# Patient Record
Sex: Female | Born: 1985 | Race: Black or African American | Hispanic: No | Marital: Single | Smoking: Current some day smoker
Health system: Southern US, Community
[De-identification: ages and names within clinical notes are randomized; demographics above are authoritative.]

## PROBLEM LIST (undated history)

## (undated) DIAGNOSIS — M4306 Spondylolysis, lumbar region: Secondary | ICD-10-CM

## (undated) DIAGNOSIS — T7840XA Allergy, unspecified, initial encounter: Secondary | ICD-10-CM

## (undated) DIAGNOSIS — E010 Iodine-deficiency related diffuse (endemic) goiter: Secondary | ICD-10-CM

## (undated) DIAGNOSIS — N809 Endometriosis, unspecified: Secondary | ICD-10-CM

## (undated) DIAGNOSIS — F909 Attention-deficit hyperactivity disorder, unspecified type: Secondary | ICD-10-CM

## (undated) HISTORY — DX: Allergy, unspecified, initial encounter: T78.40XA

## (undated) HISTORY — PX: INCISE AND DRAIN ABCESS: PRO64

## (undated) HISTORY — DX: Iodine-deficiency related diffuse (endemic) goiter: E01.0

## (undated) HISTORY — DX: Attention-deficit hyperactivity disorder, unspecified type: F90.9

---
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
## 2006-04-03 ENCOUNTER — Emergency Department (HOSPITAL_COMMUNITY): Admission: EM | Admit: 2006-04-03 | Discharge: 2006-04-03 | Payer: Self-pay | Admitting: Family Medicine

## 2006-04-05 ENCOUNTER — Emergency Department (HOSPITAL_COMMUNITY): Admission: EM | Admit: 2006-04-05 | Discharge: 2006-04-05 | Payer: Self-pay | Admitting: Emergency Medicine

## 2006-05-04 ENCOUNTER — Emergency Department (HOSPITAL_COMMUNITY): Admission: EM | Admit: 2006-05-04 | Discharge: 2006-05-04 | Payer: Self-pay | Admitting: Emergency Medicine

## 2006-07-26 ENCOUNTER — Emergency Department (HOSPITAL_COMMUNITY): Admission: EM | Admit: 2006-07-26 | Discharge: 2006-07-26 | Payer: Self-pay | Admitting: Emergency Medicine

## 2006-09-15 ENCOUNTER — Emergency Department (HOSPITAL_COMMUNITY): Admission: EM | Admit: 2006-09-15 | Discharge: 2006-09-15 | Payer: Self-pay | Admitting: Family Medicine

## 2006-09-28 ENCOUNTER — Emergency Department (HOSPITAL_COMMUNITY): Admission: EM | Admit: 2006-09-28 | Discharge: 2006-09-28 | Payer: Self-pay | Admitting: Family Medicine

## 2007-05-24 ENCOUNTER — Inpatient Hospital Stay (HOSPITAL_COMMUNITY): Admission: AD | Admit: 2007-05-24 | Discharge: 2007-05-26 | Payer: Self-pay | Admitting: Obstetrics and Gynecology

## 2007-07-09 ENCOUNTER — Emergency Department (HOSPITAL_COMMUNITY): Admission: EM | Admit: 2007-07-09 | Discharge: 2007-07-09 | Payer: Self-pay | Admitting: Emergency Medicine

## 2007-09-22 ENCOUNTER — Emergency Department (HOSPITAL_COMMUNITY): Admission: EM | Admit: 2007-09-22 | Discharge: 2007-09-22 | Payer: Self-pay | Admitting: Emergency Medicine

## 2007-09-23 ENCOUNTER — Emergency Department (HOSPITAL_COMMUNITY): Admission: EM | Admit: 2007-09-23 | Discharge: 2007-09-23 | Payer: Self-pay | Admitting: Emergency Medicine

## 2007-09-24 ENCOUNTER — Emergency Department (HOSPITAL_COMMUNITY): Admission: EM | Admit: 2007-09-24 | Discharge: 2007-09-24 | Payer: Self-pay | Admitting: Emergency Medicine

## 2007-11-04 ENCOUNTER — Inpatient Hospital Stay (HOSPITAL_COMMUNITY): Admission: EM | Admit: 2007-11-04 | Discharge: 2007-11-09 | Payer: Self-pay | Admitting: Emergency Medicine

## 2007-11-14 ENCOUNTER — Emergency Department (HOSPITAL_COMMUNITY): Admission: EM | Admit: 2007-11-14 | Discharge: 2007-11-14 | Payer: Self-pay | Admitting: Emergency Medicine

## 2008-01-17 ENCOUNTER — Emergency Department (HOSPITAL_COMMUNITY): Admission: EM | Admit: 2008-01-17 | Discharge: 2008-01-17 | Payer: Self-pay | Admitting: Emergency Medicine

## 2008-01-22 ENCOUNTER — Inpatient Hospital Stay (HOSPITAL_COMMUNITY): Admission: AD | Admit: 2008-01-22 | Discharge: 2008-01-22 | Payer: Self-pay | Admitting: Obstetrics and Gynecology

## 2008-04-13 ENCOUNTER — Inpatient Hospital Stay (HOSPITAL_COMMUNITY): Admission: AD | Admit: 2008-04-13 | Discharge: 2008-04-13 | Payer: Self-pay | Admitting: Obstetrics and Gynecology

## 2008-06-09 HISTORY — PX: TUBAL LIGATION: SHX77

## 2008-09-15 ENCOUNTER — Inpatient Hospital Stay (HOSPITAL_COMMUNITY): Admission: AD | Admit: 2008-09-15 | Discharge: 2008-09-15 | Payer: Self-pay | Admitting: Obstetrics and Gynecology

## 2008-11-12 ENCOUNTER — Inpatient Hospital Stay (HOSPITAL_COMMUNITY): Admission: AD | Admit: 2008-11-12 | Discharge: 2008-11-15 | Payer: Self-pay | Admitting: Obstetrics and Gynecology

## 2008-11-13 ENCOUNTER — Encounter (INDEPENDENT_AMBULATORY_CARE_PROVIDER_SITE_OTHER): Payer: Self-pay | Admitting: Obstetrics and Gynecology

## 2009-06-09 HISTORY — PX: ENDOMETRIAL ABLATION: SHX621

## 2009-08-10 ENCOUNTER — Emergency Department (HOSPITAL_COMMUNITY): Admission: EM | Admit: 2009-08-10 | Discharge: 2009-08-10 | Payer: Self-pay | Admitting: Family Medicine

## 2009-11-03 IMAGING — CR DG CHEST 2V
2 series · 2 of 2 positions shown · non-contrast
Comparison: 07/09/2007

CLINICAL DATA: Syncope.  Nausea.

CHEST - 2 VIEW

[w chest pa]
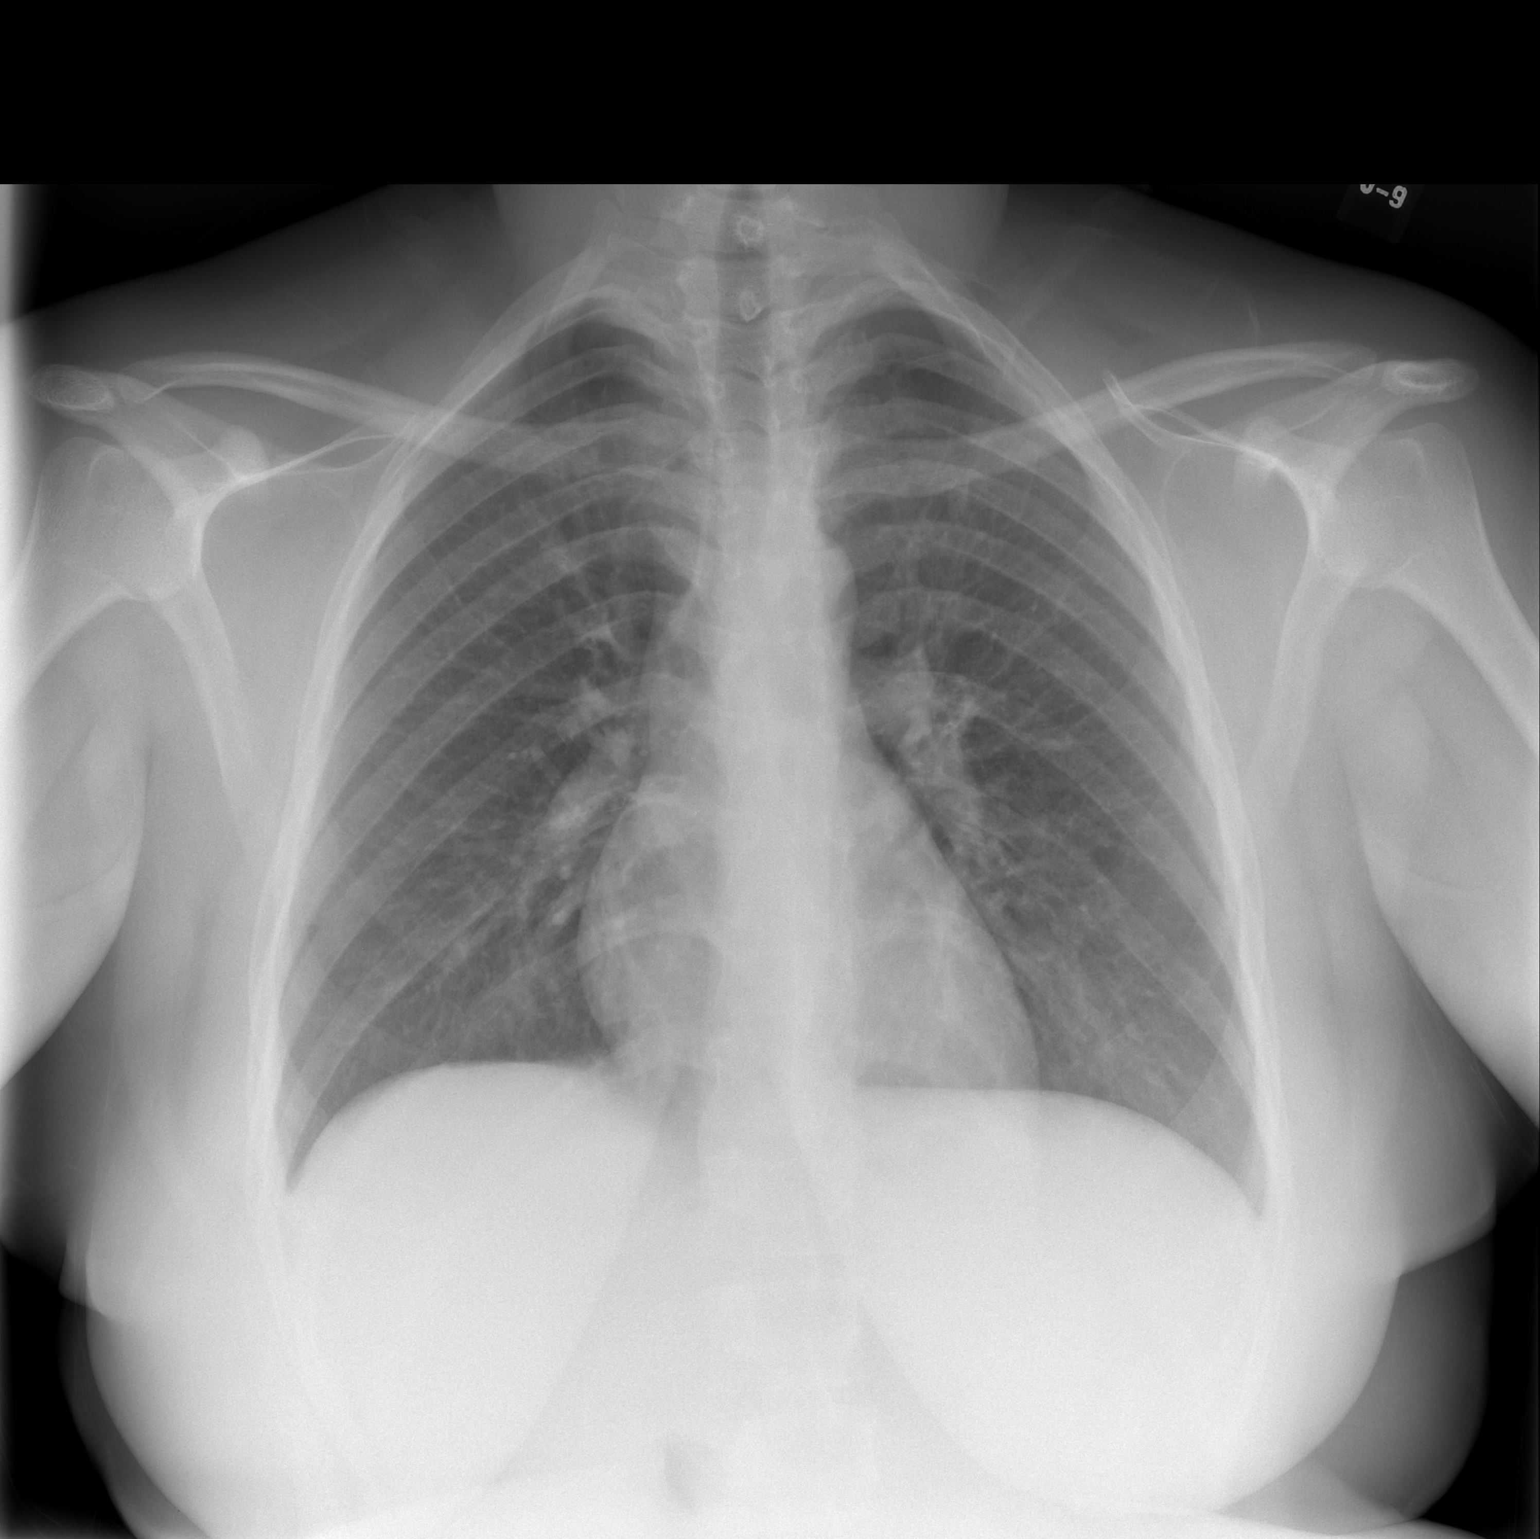

[w chest lat]
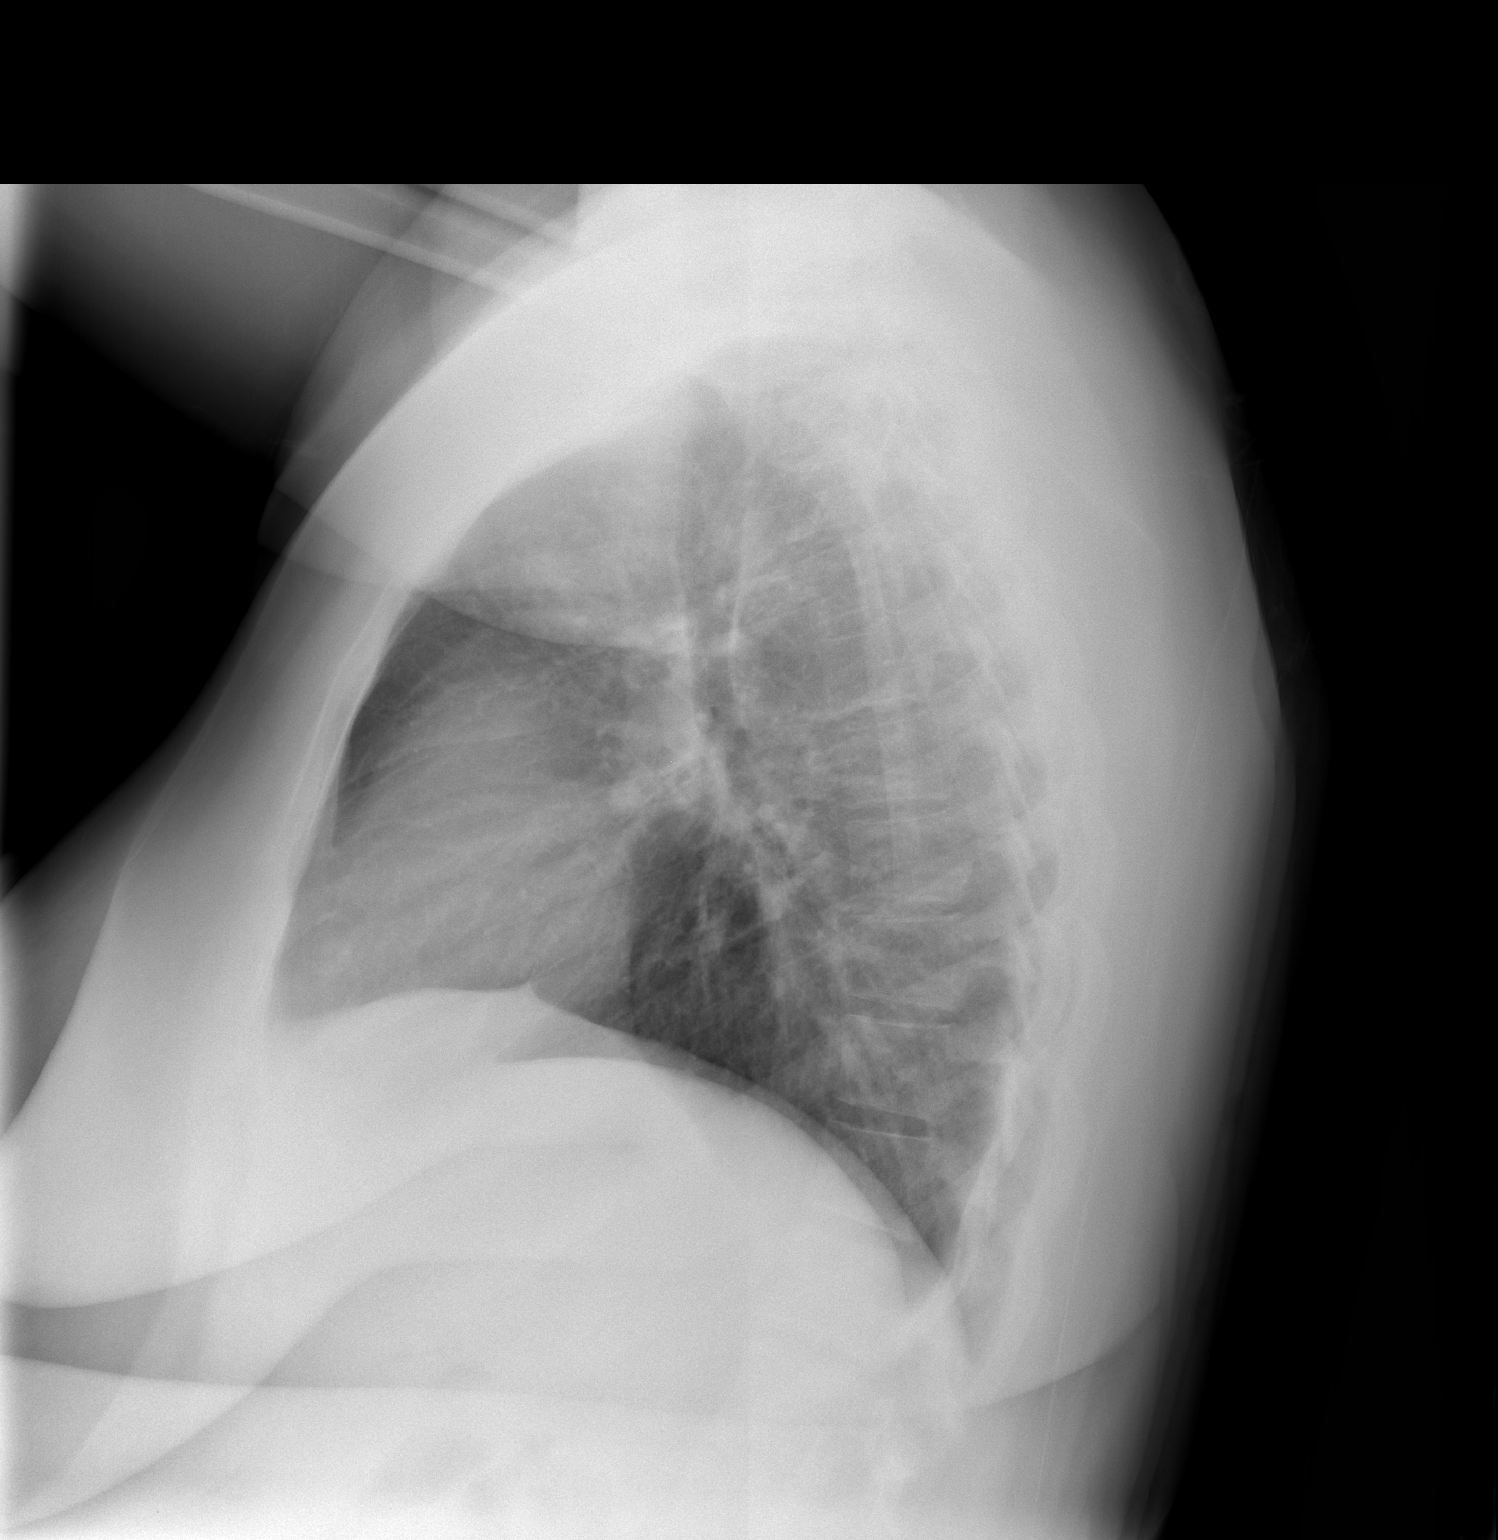

[2 of 2 positions shown; findings below may reference images not displayed]

FINDINGS: Cardiac and mediastinal contours appear normal.  The
lungs appear clear.  No pleural effusion or acute thoracic findings
identified.
IMPRESSION: 1.  No acute thoracic findings

## 2009-12-05 ENCOUNTER — Emergency Department (HOSPITAL_COMMUNITY): Admission: EM | Admit: 2009-12-05 | Discharge: 2009-12-05 | Payer: Self-pay | Admitting: Emergency Medicine

## 2010-02-21 ENCOUNTER — Ambulatory Visit (HOSPITAL_COMMUNITY): Admission: RE | Admit: 2010-02-21 | Discharge: 2010-02-21 | Payer: Self-pay | Admitting: Obstetrics and Gynecology

## 2010-05-16 ENCOUNTER — Inpatient Hospital Stay (HOSPITAL_COMMUNITY): Admission: AD | Admit: 2010-05-16 | Discharge: 2010-01-08 | Payer: Self-pay | Admitting: Obstetrics and Gynecology

## 2010-06-09 DIAGNOSIS — N800 Endometriosis of uterus: Secondary | ICD-10-CM

## 2010-06-09 DIAGNOSIS — N8003 Adenomyosis of the uterus: Secondary | ICD-10-CM

## 2010-06-09 HISTORY — DX: Adenomyosis of the uterus: N80.03

## 2010-06-09 HISTORY — DX: Endometriosis of uterus: N80.0

## 2010-06-11 ENCOUNTER — Inpatient Hospital Stay (HOSPITAL_COMMUNITY)
Admission: AD | Admit: 2010-06-11 | Discharge: 2010-06-11 | Payer: Self-pay | Source: Home / Self Care | Attending: Obstetrics and Gynecology | Admitting: Obstetrics and Gynecology

## 2010-08-01 DIAGNOSIS — J45909 Unspecified asthma, uncomplicated: Secondary | ICD-10-CM | POA: Insufficient documentation

## 2010-08-02 DIAGNOSIS — J309 Allergic rhinitis, unspecified: Secondary | ICD-10-CM | POA: Insufficient documentation

## 2010-08-19 LAB — URINALYSIS, ROUTINE W REFLEX MICROSCOPIC
Bilirubin Urine: NEGATIVE
Glucose, UA: NEGATIVE mg/dL
Ketones, ur: NEGATIVE mg/dL
Leukocytes, UA: NEGATIVE
Nitrite: NEGATIVE
Protein, ur: NEGATIVE mg/dL
Specific Gravity, Urine: 1.015 (ref 1.005–1.030)
Urobilinogen, UA: 0.2 mg/dL (ref 0.0–1.0)
pH: 7 (ref 5.0–8.0)

## 2010-08-19 LAB — GC/CHLAMYDIA PROBE AMP, GENITAL
Chlamydia, DNA Probe: NEGATIVE
GC Probe Amp, Genital: NEGATIVE

## 2010-08-19 LAB — POCT PREGNANCY, URINE: Preg Test, Ur: NEGATIVE

## 2010-08-19 LAB — URINE MICROSCOPIC-ADD ON

## 2010-08-19 LAB — WET PREP, GENITAL
Trich, Wet Prep: NONE SEEN
Yeast Wet Prep HPF POC: NONE SEEN

## 2010-08-22 LAB — SURGICAL PCR SCREEN

## 2010-08-22 LAB — CBC
HCT: 38.8 % (ref 36.0–46.0)
RBC: 3.98 MIL/uL (ref 3.87–5.11)
RDW: 13.1 % (ref 11.5–15.5)
WBC: 7.5 10*3/uL (ref 4.0–10.5)

## 2010-08-23 LAB — CBC
HCT: 37.5 % (ref 36.0–46.0)
Hemoglobin: 13.1 g/dL (ref 12.0–15.0)
MCH: 33.5 pg (ref 26.0–34.0)
MCHC: 34.9 g/dL (ref 30.0–36.0)

## 2010-08-23 LAB — URINALYSIS, ROUTINE W REFLEX MICROSCOPIC
Glucose, UA: NEGATIVE mg/dL
Protein, ur: NEGATIVE mg/dL
Urobilinogen, UA: 1 mg/dL (ref 0.0–1.0)

## 2010-08-23 LAB — POCT PREGNANCY, URINE: Preg Test, Ur: NEGATIVE

## 2010-08-23 LAB — WET PREP, GENITAL

## 2010-09-16 LAB — CBC
Hemoglobin: 11.1 g/dL — ABNORMAL LOW (ref 12.0–15.0)
MCHC: 35 g/dL (ref 30.0–36.0)
MCV: 98.7 fL (ref 78.0–100.0)
Platelets: 204 10*3/uL (ref 150–400)
Platelets: 227 10*3/uL (ref 150–400)
RBC: 3.18 MIL/uL — ABNORMAL LOW (ref 3.87–5.11)
RDW: 13.5 % (ref 11.5–15.5)
RDW: 13.6 % (ref 11.5–15.5)

## 2010-09-16 LAB — DIFFERENTIAL
Basophils Absolute: 0 10*3/uL (ref 0.0–0.1)
Lymphocytes Relative: 10 % — ABNORMAL LOW (ref 12–46)
Monocytes Absolute: 0.8 10*3/uL (ref 0.1–1.0)
Monocytes Relative: 6 % (ref 3–12)
Neutro Abs: 11 10*3/uL — ABNORMAL HIGH (ref 1.7–7.7)

## 2010-09-16 LAB — URINE CULTURE
Colony Count: NO GROWTH
Culture: NO GROWTH
Special Requests: NEGATIVE

## 2010-09-16 LAB — RPR: RPR Ser Ql: NONREACTIVE

## 2010-09-18 LAB — URINALYSIS, ROUTINE W REFLEX MICROSCOPIC
Bilirubin Urine: NEGATIVE
Glucose, UA: NEGATIVE mg/dL
Ketones, ur: >80 mg/dL — AB
Protein, ur: NEGATIVE mg/dL

## 2010-09-27 DIAGNOSIS — H6990 Unspecified Eustachian tube disorder, unspecified ear: Secondary | ICD-10-CM | POA: Insufficient documentation

## 2010-09-27 DIAGNOSIS — N949 Unspecified condition associated with female genital organs and menstrual cycle: Secondary | ICD-10-CM | POA: Insufficient documentation

## 2010-09-27 DIAGNOSIS — N8000 Endometriosis of the uterus, unspecified: Secondary | ICD-10-CM | POA: Insufficient documentation

## 2010-09-27 DIAGNOSIS — L309 Dermatitis, unspecified: Secondary | ICD-10-CM | POA: Insufficient documentation

## 2010-10-22 NOTE — H&P (Signed)
NAMELORRENA, GORANSON              ACCOUNT NO.:  [id number]   MEDICAL RECORD NO.:  [id number]          PATIENT TYPE:  MAT   LOCATION:  MATC                          FACILITY:  [facility]   PHYSICIAN:  Crist Fat. Rivard, M.D. DATE OF BIRTH:  08-May-1986   DATE OF ADMISSION:  05/24/2007  DATE OF DISCHARGE:                              HISTORY & PHYSICAL   HISTORY OF PRESENT ILLNESS:  Ms. [name] is a 25 year old gravida 2, para  1-0-0-1 at 39-5/7 weeks who presents uterine contractions every 3  minutes for several hours.  She denies leaking or bleeding and reports  positive fetal movement.  Pregnancy has been remarkable for:  (1) ADHD,  (2) asthma, (3) penicillin allergy, (4) closely spaced pregnancy, (5)  elevated BMI, (6) group B strep negative.   PRENATAL LABORATORY DATA:  Blood type is O positive, Rh-antibody  negative.  VDRL nonreactive.  Rubella titer positive.  Hepatitis B  surface antigen negative.  HIV nonreactive.  Sickle cell test was  negative.  Varicella titer was negative.  Pap was normal.  GC and  Chlamydia cultures were negative at her first visit.  Hemoglobin upon  entry into practice was 12.7; it was 12.5 at 27 weeks.  Quadruple screen  was done and was normal.  RPR was normal.  Group B strep culture and  other cultures were negative at 36 weeks.   HISTORY OF PRESENT PREGNANCY:  The patient entered care at approximately  19 weeks.  She had an ultrasound at 19 weeks for anatomy.  She had had a  previous ultrasound at 17 weeks.  Her Glucola was normal  She had a  yeast infection at 29 weeks.  She had some upper respiratory issues at  35 weeks.  She had some asthma issues during her pregnancy and was  treated with albuterol.  The rest of her pregnancy was essentially  uncomplicated.   OBSTETRICAL HISTORY:  In January 2007, she had a vacuum-assisted vaginal  birth of a female infant, weight 8 pounds 3 ounces, at 41-3/7 weeks. She  was in labor 14 hours.  She had epidural  anesthesia.  That child was in  the NICU for 3 days for respiratory issues.   MEDICAL HISTORY:  The patient reports the usual childhood illnesses  except for an unknown chickenpox history.  Her varicella titer was  negative.  The patient was diagnosed with asthma at age 57 or 75.  The  patient was diagnosed with ADHD in the past.  She has not been on any  medication.  The patient's only other hospitalization was for  childbirth.   ALLERGIES:  She is allergic to PENICILLIN.   FAMILY HISTORY:  Her maternal grandfather, paternal grandmother and  paternal grandfather have hypertension.  Her father has diabetes.  Her  sister has depression and is bipolar.   GENETIC HISTORY:  Remarkable for a first cousin with heart murmur.   SURGICAL HISTORY:  None.   PHYSICAL EXAMINATION:  VITAL SIGNS:  Stable.  The patient is afebrile.  HEENT:  Within normal limits.  LUNGS:  Bilateral breath sounds are clear.  HEART:  Regular rate and rhythm without murmur.  BREASTS:  Soft and nontender.  ABDOMEN:  Fundal height is approximately 39 cm.  Estimated fetal weight  is 7 to 7-1/2 pounds.  Uterine contractions are every 2-3 minutes, of  moderate quality.  Fetal heart rate is reactive with no decelerations.  PELVIC:  Cervix initially was 1 cm, 70%, vertex at -2 station.  The  cervix is now, approximately 1 hour later, 4 cm, 80%, vertex at a -2  station with bulging bag of water.  EXTREMITIES:  Deep tendon reflexes are 2+ without clonus.  There is a  trace edema noted.   IMPRESSION:  1. Intrauterine pregnancy at 39-5/7 weeks.  2. Early active labor.  3. Negative group B streptococcus.   PLAN:  1. Admit to birthing suite per consult with Dr. Estanislado Pandy as attending      physician.  2. Routine certified nurse midwife orders.  3. The patient plans epidural.      Chip Boer L. Emilee Hero, C.N.M.      Crist Fat Rivard, M.D.  Electronically Signed    VLL/MEDQ  D:  05/24/2007  T:  05/24/2007  Job:  [id number]

## 2010-10-22 NOTE — Op Note (Signed)
[name], [name]              ACCOUNT NO.:  [id number]   MEDICAL RECORD NO.:  [id number]           PATIENT TYPE:   LOCATION:                                 FACILITY:   PHYSICIAN:  Janine Limbo, M.D.DATE OF BIRTH:  19-May-1986   DATE OF PROCEDURE:  11/13/2008  DATE OF DISCHARGE:                               OPERATIVE REPORT   PREOPERATIVE DIAGNOSES:  1. Postpartum day #1.  2. Desires sterilization.   POSTOPERATIVE DIAGNOSES:  1. Postpartum day #1.  2. Desires sterilization.   PROCEDURE:  Modified [place] postpartum bilateral tubal ligation.   SURGEON:  Janine Limbo, MD   FIRST ASSISTANT:  None.   ANESTHETIC:  Epidural.   DISPOSITION:  [name] is a 25 year old female, para 3-0-0-3, who  presents with the above-mentioned diagnosis.  She understands the  indications for her surgical procedure, and she accepts the risks of,  but not limited to, anesthetic complications, bleeding, infections, and  possible damage to the surrounding organs.  She understands that there  is a small but real failure rate associated with tubal ligation (17 per  1000).   FINDINGS:  The fallopian tubes were normal bilaterally.   PROCEDURE:  The patient was taken to the operating room where it was  determined that the epidural she had received for labor would be  adequate for tubal ligation.  The patient's abdomen and perineum were  prepped with multiple layers of Betadine.  A Foley catheter was placed  in the bladder.  The patient was sterilely draped.  The subumbilical  area was injected with 5 mL of 0.5% Marcaine with epinephrine.  A  subumbilical incision was made and carried sharply through the  subcutaneous tissue, the fascia, and the anterior peritoneum.  The left  fallopian tube was identified and followed to its fimbriated end.  A  knuckle of tube was made on the left using a free tie and then suture  ligatures of 0 plain catgut.  The knuckle of tube thus made was  excised.  Hemostasis was adequate.  An identical procedure was carried down the  opposite side.  Again, hemostasis was adequate.  All instruments were  removed.  The anterior peritoneum and the fascia were closed using a  running suture of 2-0 Vicryl.  The skin was  closed using a subcuticular suture of 3-0 Monocryl.  Sponge, needle, and  instrument counts were correct on 2 occasions.  The estimated blood loss  was 5 mL.  The patient tolerated the procedure well.  She was taken to  the recovery room in stable condition.  The cut portions of the  fallopian tubes were sent to pathology for evaluation.      Janine Limbo, M.D.  Electronically Signed     AVS/MEDQ  D:  11/13/2008  T:  11/14/2008  Job:  [id number]

## 2010-10-22 NOTE — H&P (Signed)
NAMEPASCHA, FOGAL              ACCOUNT NO.:  [id number]   MEDICAL RECORD NO.:  [id number]          PATIENT TYPE:  INP   LOCATION:  0103                         FACILITY:  [facility]   PHYSICIAN:  Herbie Saxon, MDDATE OF BIRTH:  1985/07/15   DATE OF ADMISSION:  11/04/2007  DATE OF DISCHARGE:                              HISTORY & PHYSICAL   PRIMARY CARE PHYSICIAN:  Unassigned.   She is a full code.  No assigned health care power of attorney.   PRESENTING COMPLAINT:  Headache, one week.  Fever and chills, three  days.   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  She was  quite well until a week ago when she started noticing generalized  headaches, dull, 9/10 in severity, continuous, aggravated by noise,  light, and movement.  Associated with body and muscle aches, nausea and  vomiting.  Treated because of diagnosed with fever and chills.  No  dysuria.  No cough.  No diarrhea.  No new skin rash or joint swelling.  No sinus congestion.  No earache or runny nose.  She does have  photophobia.  No tinnitus.  No neck stiffness.   PAST MEDICAL HISTORY:  As stated earlier.   PAST SURGICAL HISTORY:  Nil of note.   FAMILY HISTORY:  Nil of note.   SOCIAL HISTORY:  She is single.  She has two children, 105 years old, and  45 months old.  The children are healthy.  Her boyfriend, who she stays  with, is also not having any febrile illness.   MEDICATIONS:  None.   ALLERGIES:  PENICILLIN.   REVIEW OF SYSTEMS:  Fourteen systems are reviewed.  Pertinent positives  as in the history of presenting complaint.   PHYSICAL EXAMINATION:  She is a young lady, not in acute respiratory  distress.  Temperature 103, pulse 119, respiratory rate 22, blood pressure 92/56.  Pupils are equal and reactive to light and accommodation.  Mucous  membranes are moist.  No sinus tenderness.  Oropharynx and nasopharynx  are clear.  NECK:  Supple.  No neck stiffness.  No submandibular lymphadenopathy.  No  lymphadenopathy.  She has old identified eczematous lesions lateral aspect of both eyes.  CHEST:  Clinically clear.  She has truncal obesity.  Heart sounds 1 and 2.  Tachycardic.  ABDOMEN:  Soft and nontender.  No organomegaly.  No renal angle  tenderness.  Inguinal orifices are patent.  She is alert and oriented to time, place, and person.  No tremors.  Power is 5 in all limbs.  Deep tendon reflexes are 2+.  Cranial nerves  II-XII intact.  No gait abnormality.  Peripheral pulses present.  No  pedal edema.   AVAILABLE LABS:  WBC 19.7, hematocrit 41.9, platelet count 236.  Urinalysis:  WBCs 7-10, rare bacteria, leukocyte esterase small.  Chemistry shows a sodium of 136, potassium 3, chloride 100, BUN 10,  creatinine 1.3, glucose 116.   Lumbar puncture:  CSF analysis shows  a CSF glucose of 70, CSF of RBCs 2,  total protein 18, normal.   The chest x-ray shows no acute cardiopulmonary disease.   The CT head showed no acute findings.   ASSESSMENT:  1. Sepsis.  2. Hypertension.  3. Tachycardia.  4. Urinary tract infection.  5. Headache, query migraine.  6. Hypokalemia.  7. Obesity.  8. Seasonal allergies.  9. Bronchial asthma.  10.Eczema.  11.Leukocytosis.   Patient is to be admitted to a telemetry bed.  Will send blood culture,  urine culture, start on IV Cipro 500 mg q.12h., supplemental potassium  40 mEq p.o. stat, IV fluids, normal saline at 80 ml/hr.  Bedrest.  Seizure precautions.  Monitor input and output with this Foley catheter.   DIET:  Regular.   ACTIVITY:  Bed rest.   Will obtain a renal ultrasound scan, ESR.  Put on Lovenox 40 mg subcu  daily.  Phenergan 12.5 mg IV q.6h. p.r.n.  Protonix 40 mg IV daily.  Duonebs 1 unit dose q.6h. p.r.n. shortness of breath.  Tylenol 650 mg  q.4-6h. p.r.n. fever or pain.   Repeat the CBC, BMP, and urinalysis in  the a.m.  Also check a calcium/magnesium/phosphate level.  Put on  Fioricet 2 tabs q.4-6h. p.r.n. alternate between Dilaudid 1-2 mg IV  q.4h. p.r.n. severe headache.  Consult neurology's input.  Consult  infectious disease input.      Herbie Saxon, MD  Electronically Signed     MIO/MEDQ  D:  11/04/2007  T:  11/04/2007  Job:  [id number]
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
## 2010-10-22 NOTE — Discharge Summary (Signed)
[name], [name]              ACCOUNT NO.:  [id number]   MEDICAL RECORD NO.:  [id number]          PATIENT TYPE:  INP   LOCATION:  1426                         FACILITY:  [facility]   PHYSICIAN:  Hillery Aldo, M.D.   DATE OF BIRTH:  Jun 13, 1985   DATE OF ADMISSION:  11/04/2007  DATE OF DISCHARGE:  11/09/2007                               DISCHARGE SUMMARY   PRIMARY CARE PHYSICIAN:  Unassigned.  The patient has been to the [facility] in the past, and will be referred back there  for followup.   DISCHARGE DIAGNOSES:  1. Sepsis.  2. Methicillin-resistant Staphylococcus aureus, thigh abscess, status      post incision and drainage.  3. History of asthma.  4. History of eczema.  5. Hypokalemia.   DISCHARGE MEDICATIONS:  1. Doxycycline 100 mg b.i.d. x10 days.  2. Percocet 5/325 one to two tabs q.6 h. p.r.n.  3. Zofran 4 mg q.8 h. p.r.n. nausea.   CONSULTATIONS:  Dr. Bertram Savin of general surgery.   BRIEF ADMISSION HPI:  The patient is a 25 year old female who presented  to the hospital with chief complaint of fever and chills for 3 days.  She also had headaches and was admitted for further evaluation and  workup when she was found to have a brief leukocytosis.  For the full  details please see the dictated report done by Dr. Christella Noa.   PROCEDURES AND DIAGNOSTIC STUDIES:  1. CT scan of the head on Nov 04, 2007 showed no acute or specific      findings.  2. Chest x-ray on Nov 04, 2007 showed no active cardiopulmonary      disease.  3. CT-guided lumbar puncture done on Nov 04, 2007 revealed an opening      pressure of 35 cm of water and 10 mL of clear cerebrospinal fluid      was obtained for laboratory studies.  4. Renal ultrasound on Nov 28, 2007 showed no acute findings.   DISCHARGE LABORATORY DATA:  Sodium is 140, potassium of 4.3, chloride  104, bicarb 27, BUN 3, creatinine 0.79, glucose 104.  White blood cell  count was 4.3, hemoglobin 12.5,  hematocrit 36.3, platelets 235.   HOSPITAL COURSE:  1. Sepsis:  The patient was initially admitted with fever and chills,      muscle aches, nausea, vomiting, and a syndrome worrisome for      meningitis.  She had fevers accompanied by photophobia and      headaches.  A lumbar puncture was done which did not reveal any      evidence of meningitis.  She was tachycardic and hypotensive, and      received vigorous IV fluid rehydration.  The patient began to      complain of thigh pain within 24 hours, and she was noted to have      an abscess in the left thigh prompting a surgical consult.  The      patient had a 4 to 5 cm area of induration which was subsequently      infiltrated with lidocaine and  drained by the general surgeons.      Wound cultures ultimately grew out methicillin-resistant      Staphylococcus aureus.  The patient was put on vancomycin, and has      now completed 4 days of therapy with vancomycin.  Sensitivities do      show that the organism is sensitive to doxycycline.  She was also      seen in consultation with the wound care RN, and will receive      further wound care by home health nursing.  The wound will be      packed with iodoform and changed by the home health nurses daily.      She should follow up with the Outpatient [facility] for      hospital followup.  2. History of asthma:  The patient did not have any evidence of acute      exacerbation here in the hospital.  3. History of eczema:  The patient's skin has remained clear.   DISPOSITION:  The patient is medically stable and will be discharged  home.  She will received home health nursing care for ongoing help with  wound management.  She is instructed to follow up with the Outpatient  [facility].      Hillery Aldo, M.D.  Electronically Signed     CR/MEDQ  D:  11/09/2007  T:  11/09/2007  Job:  [id number]

## 2010-10-22 NOTE — Consult Note (Signed)
[name], [name]              ACCOUNT NO.:  [id number]   MEDICAL RECORD NO.:  [id number]          PATIENT TYPE:  INP   LOCATION:  1426                         [facility]ACILITY:  [facility]   PHYSICIAN:  Lennie Muckle, MD      DATE OF BIRTH:  [birth year]   DATE OF CONSULTATION:  11/05/2007  DATE OF DISCHARGE:                                 CONSULTATION   REASON FOR CONSULTATION:  Left thigh abscess.   [name] is a 25 year old female who was admitted on the 28 of May due  to headache, fevers or chills for three days.  She states she had  noticed the left leg swelling for the past 24 hours.  It has gotten  worse in the past 12 hours.  There is pain at the site, increased  swelling.  She did have fevers and chills on admission, and had chills  at home.  She denies muscle or body aches, nausea nd vomiting.  She had  had a pervious abscess in her labial area and had an incision and  drainage of this a few months ago at [facility], and currently is  being treated with vancomycin.   PAST MEDICAL HISTORY:  Seasonal allergies, eczema, MRSA infection  previously.  History of bronchitis.   SURGICAL HISTORY:  Incision and drainage.   MEDICATIONS AT HOME:  None.   ALLERGIES:  PENICILLIN.   FAMILY HISTORY:  Negative.   SOCIAL HISTORY:  She is single.  She has two children, 4 years old and 4  months.   REVIEW OF SYSTEMS:  CONSTITUTIONAL:  She has overall felt fatigued and  tired, had malacia, body aches, headaches.  She has some complaints of  photophobia on admission.  No ear ringing, no cough, no diarrhea.  No  neck stiffness.   PHYSICAL EXAMINATION:  She is lying in bed in no acute distress.  Temperature is 100.1.  Blood pressure 103/58, pulse 89.  Focused examination of her lower extremity reveals an area of roughly 45  cm on the left inner thigh that is indurated and has some skin changes  in the vicinity, and is tender to the touch.   ASSESSMENT/PLAN:  Abscess of the thigh.   PLAN:   A procedure was performed in the room after discussion with the  patient.  I anesthetized the area with 1% lidocaine.  Using a #11 blade,  I incised the area of concern.  There was a small amount of purulent  fluid expressed.  Culture was sent.  I packed the area with dry gauze.  We will begin  dressing changes tomorrow.  We will continue with the vancomycin until  the cultures return.  Likely will be able to be discharged home from the  perspective of her wound in a day or so.  Treat with pain management and  antibiotics as come back from the cultures.      Lennie Muckle, MD  Electronically Signed     ALA/MEDQ  D:  11/05/2007  T:  11/05/2007  Job:  [id number]
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
## 2010-10-25 NOTE — H&P (Signed)
[name], [name]              ACCOUNT NO.:  [id number]   MEDICAL RECORD NO.:  [id number]          PATIENT TYPE:  MAT   LOCATION:  MATC                          FACILITY:  [facility]   PHYSICIAN:  Janine Limbo, M.D.DATE OF BIRTH:  [birth year]   DATE OF ADMISSION:  06/19/2005  DATE OF DISCHARGE:                                HISTORY & PHYSICAL   [name] is an 25 year old gravida 1, para 0, who is admitted at 92 weeks 3  days' gestation with complaints of regular uterine contractions for several  hours prior to admission.  The patient denies leakage of fluid or bleeding.  The patient reports her fetus has been moving normally.  The patient was  previously seen at [facility] early a.m. on June 19, 2005, at which time her  cervix was 1 cm dilated.  The patient was sent home with therapeutic  sedation.  The patient reports that after awakening from using Ambien, she  began having regular contractions.  The patient's pregnancy is remarkable  for:   1.  Adolescence.  2.  History of asthma.  3.  Positive group B strep.  4.  PENICILLIN allergy with no sensitivities available.   PRENATAL LABORATORY DATA:  Initial hemoglobin 12.8, hematocrit 36.8,  platelets 333,000.  Blood type O positive, antibody screen negative.  Sickle  cell trait negative.  RPR nonreactive.  Rubella titer immune.  Hepatitis B  surface antigen negative.  HIV nonreactive.  Pap February 2006 within normal  limits.  Gonorrhea and Chlamydia negative.  Cystic fibrosis screen negative.  Quad screen within normal limits.  Glucola at 28 weeks 98.  RPR at 28 weeks  nonreactive.  Hemoglobin at 28 weeks 11.7.  Group B strep negative at 31  weeks.  Gonorrhea and Chlamydia cultures negative at 31 weeks.  Group B  strep positive at 35 weeks.   HISTORY OF PRESENT PREGNANCY:  The patient entered care at 10 weeks'  gestation.  Patient with an LMP of September 03, 2004, which gives her a due  date of June 10, 2005.  Patient with  six-week ultrasound which confirmed  her EDC based on dates.  Patient with slight sore throat at 15 weeks with  negative group A strep culture.  Ultrasound performed at 19 weeks for  anatomy with normal anatomy and posterior placenta, cervix 4.1 cm, and size  consistent with dates.  Glucola done at 27 weeks with above-noted results.  Patient seen and evaluated in [facility] at 31 weeks with preterm contractions that  stopped with terbutaline.  Patient with a history of low back pain and  scoliosis.  Patient referred to physical therapy.  The remainder of  patient's pregnancy has been unremarkable.   PAST OBSTETRICAL HISTORY:  The patient is a gravida 1, para 0, the present  pregnancy.   GYNECOLOGIC HISTORY:  The patient denies any history of abnormal Paps or  STDs.  The patient reports regular monthly menses.   CURRENT MEDICATIONS:  Prenatal vitamins, albuterol p.r.n.   ALLERGIES:  PENICILLIN, however question reaction to penicillin.   MEDICAL HISTORY:  Asthma with rare use of  albuterol.   SURGICAL HISTORY:  Dental extractions only.   FAMILY HISTORY:  Maternal grandfather, paternal grandmother and paternal  grandfather with chronic hypertension.  Father with diabetes mellitus.  Sister with depression.   GENETIC HISTORY:  Significant for patient's mother is a twin.   SOCIAL HISTORY:  The patient is a single African-American female.  The  patient denies use of tobacco, alcohol or street drugs.  The patient's  domestic violence screen is negative.  Father of the baby, [name], is involved and supportive.   REVIEW OF SYSTEMS:  Typical of one at 41 weeks' gestation.   PHYSICAL EXAMINATION:  VITAL SIGNS:  The patient is afebrile, vital signs  stable.  Blood pressure 117/66.  HEENT:  Within normal limits.  CARDIAC:  Regular rate and rhythm.  LUNGS:  Clear.  ABDOMEN:  Soft, nontender and gravid.  Fundal height consistent with 41 cm.  Fetus in longitudinal lie and vertex to  Leopold's maneuvers.  Fetal heart  rate 130s-140s baseline with accelerations noted to be present, no  decelerations are noted to be present.  Short-term variability is also  noted.  Contractions are noted to be every five to seven minutes with  duration of 50-70 seconds, mild to moderate intensity.  PELVIC:  Speculum exam is deferred.  On cervical exam, the patient is 2 cm  dilated, 80% effaced, -3 station, vertex presentation.  EXTREMITIES:  Negative Homans bilaterally, negative edema, and 2+ deep  tendon reflexes are present.   ASSESSMENT:  1.  Intrauterine pregnancy at 41-3/7 weeks' gestation.  2.  Positive group B beta strep with PENICILLIN allergy.  3.  Early labor.   PLAN:  1.  The patient will be admitted to birthing suites per consult with Dr.      Stefano Gaul.  2.  The patient will be started on low-dose Pitocin for augmentation of      labor.  3.  The patient will be started on vancomycin for group B strep prophylaxis      secondary to PENICILLIN allergy and sensitivities not available.  4.  Routine C.N.M. orders.  5.  The patient plans to use Stadol for pain relief during labor, and Stadol      was ordered.      Rhona Leavens, CNM      Janine Limbo, M.D.  Electronically Signed    NOS/MEDQ  D:  06/20/2005  T:  06/20/2005  Job:  [id number]

## 2010-10-25 NOTE — Discharge Summary (Signed)
[name], [name]              ACCOUNT NO.:  [id number]   MEDICAL RECORD NO.:  [id number]          PATIENT TYPE:  INP   LOCATION:  9138                          FACILITY:  [facility]   PHYSICIAN:  Crist Fat. Rivard, M.D. DATE OF BIRTH:  04/09/1986   DATE OF ADMISSION:  06/19/2005  DATE OF DISCHARGE:  06/22/2005                                 DISCHARGE SUMMARY   ADMITTING DIAGNOSIS:  Intrauterine pregnancy at 41-3/7 weeks, early labor.   HOSPITAL COURSE:  The patient's labor was augmented with Pitocin and the  patient progressed normally to completely dilated. She began pushing with  worsening of variable decelerations. Dr. Estanislado Pandy was called for consultation.  When she arrived the patient was pushing and baby was crowning. Severe  variable decelerations were noted with late recovery. The patient was  offered vacuum assistance and the risks and benefits were discussed by Dr.  Estanislado Pandy with the patient. The patient agreed to vacuum extraction assistance.  This was accomplished as dictated by Dr. Estanislado Pandy with the birth of an 8  pounds 3 ounces female infant named [name]. Apgar scores were 6 at 1 minute 9  at 5 minutes. The patient has done well in the postpartum period. Her vital  signs have remained stable. She is afebrile. On the first postpartum day her  hemoglobin was 10.0. Her baby developed tachypnea and was taken to the NICU.  He is doing well and improving with possible discharge tomorrow. The patient  has been pumping her breasts in anticipation of breast-feeding. She has  complained of some abdominal and back pain but has not been taking her pain  medicines regularly. Her abdomen is soft and nontender. The fundus is firm;  2 below umbilicus with moderate lochia. Extremities show no edema and no  calf tenderness bilaterally. The patient is judged to be in satisfactory  condition for discharge. Discharge instructions per [facility]  handout.   DISCHARGE MEDICATIONS:  1.   Motrin 600 milligrams p.o. q.6 h p.r.n. pain  2.  Tylox one to two p.o. q. 3-4 hours p.r.n. pain  3.  Prenatal vitamins.   The patient will decide contraceptive options at her 6 weeks postpartum  visit. She is encouraged to take pain medicine at regularly scheduled  intervals, eat small frequent meals, and increase her fluid intake and the  rest. The possibility of her staying overnight tonight with the baby, the  patient will be explored as this would be advantageous to the patient.      Rica Koyanagi, C.N.M.      Crist Fat Rivard, M.D.  Electronically Signed    SDM/MEDQ  D:  06/22/2005  T:  06/23/2005  Job:  [id number]

## 2010-12-03 ENCOUNTER — Inpatient Hospital Stay (HOSPITAL_COMMUNITY)
Admission: AD | Admit: 2010-12-03 | Discharge: 2010-12-04 | Disposition: A | Discharge status: Home / Self Care | Payer: Medicaid Other | Source: Ambulatory Visit | Attending: Obstetrics and Gynecology | Admitting: Obstetrics and Gynecology

## 2010-12-03 DIAGNOSIS — N764 Abscess of vulva: Secondary | ICD-10-CM | POA: Insufficient documentation

## 2011-01-02 ENCOUNTER — Inpatient Hospital Stay (INDEPENDENT_AMBULATORY_CARE_PROVIDER_SITE_OTHER)
Admission: RE | Admit: 2011-01-02 | Discharge: 2011-01-02 | Disposition: A | Discharge status: Home / Self Care | Payer: Medicaid Other | Source: Ambulatory Visit | Attending: Family Medicine | Admitting: Family Medicine

## 2011-01-02 DIAGNOSIS — H109 Unspecified conjunctivitis: Secondary | ICD-10-CM

## 2011-01-02 DIAGNOSIS — H00019 Hordeolum externum unspecified eye, unspecified eyelid: Secondary | ICD-10-CM

## 2011-01-24 ENCOUNTER — Inpatient Hospital Stay (INDEPENDENT_AMBULATORY_CARE_PROVIDER_SITE_OTHER)
Admission: RE | Admit: 2011-01-24 | Discharge: 2011-01-24 | Disposition: A | Discharge status: Home / Self Care | Payer: Medicaid Other | Source: Ambulatory Visit | Attending: Family Medicine | Admitting: Family Medicine

## 2011-01-24 ENCOUNTER — Ambulatory Visit (INDEPENDENT_AMBULATORY_CARE_PROVIDER_SITE_OTHER): Payer: Medicaid Other

## 2011-01-24 DIAGNOSIS — S93609A Unspecified sprain of unspecified foot, initial encounter: Secondary | ICD-10-CM

## 2011-03-04 LAB — URINALYSIS, ROUTINE W REFLEX MICROSCOPIC
Glucose, UA: NEGATIVE
Leukocytes, UA: NEGATIVE
Nitrite: NEGATIVE
Specific Gravity, Urine: 1.029
pH: 8

## 2011-03-04 LAB — URINE MICROSCOPIC-ADD ON

## 2011-03-04 LAB — CBC
HCT: 39.9
MCHC: 34.4
MCV: 91.1
Platelets: 311
RDW: 14.1
WBC: 15.3 — ABNORMAL HIGH

## 2011-03-04 LAB — BASIC METABOLIC PANEL
BUN: 9
CO2: 27
Chloride: 104
Creatinine, Ser: 0.76
Glucose, Bld: 98

## 2011-03-04 LAB — WOUND CULTURE

## 2011-03-04 LAB — PREGNANCY, URINE: Preg Test, Ur: NEGATIVE

## 2011-03-05 LAB — CULTURE, ROUTINE-ABSCESS

## 2011-03-05 LAB — BASIC METABOLIC PANEL
BUN: 1 — ABNORMAL LOW
CO2: 24
CO2: 27
Calcium: 8.2 — ABNORMAL LOW
Calcium: 8.3 — ABNORMAL LOW
Chloride: 107
Creatinine, Ser: 0.97
Creatinine, Ser: 0.98
GFR calc Af Amer: >60
GFR calc non Af Amer: >60
Glucose, Bld: 104 — ABNORMAL HIGH
Glucose, Bld: 106 — ABNORMAL HIGH

## 2011-03-05 LAB — URINE CULTURE: Colony Count: NO GROWTH

## 2011-03-05 LAB — HEPATIC FUNCTION PANEL
AST: 15
Bilirubin, Direct: 0.1
Indirect Bilirubin: 0.6
Total Bilirubin: 0.7

## 2011-03-05 LAB — URINE MICROSCOPIC-ADD ON

## 2011-03-05 LAB — URINALYSIS, ROUTINE W REFLEX MICROSCOPIC
Bilirubin Urine: NEGATIVE
Glucose, UA: NEGATIVE
Hgb urine dipstick: NEGATIVE
Ketones, ur: 15 — AB
Ketones, ur: NEGATIVE
Nitrite: NEGATIVE
Protein, ur: 30 — AB
Specific Gravity, Urine: 1.021
Urobilinogen, UA: 1
pH: 6.5
pH: 7

## 2011-03-05 LAB — CSF CELL COUNT WITH DIFFERENTIAL: Tube #: 4

## 2011-03-05 LAB — COMPREHENSIVE METABOLIC PANEL
ALT: 22
AST: 20
Alkaline Phosphatase: 50
CO2: 29
Chloride: 106
GFR calc Af Amer: >60
GFR calc non Af Amer: >60
Potassium: 3.4 — ABNORMAL LOW
Sodium: 140
Total Bilirubin: 0.5

## 2011-03-05 LAB — MONONUCLEOSIS SCREEN: Mono Screen: NEGATIVE

## 2011-03-05 LAB — CBC
HCT: 34.6 — ABNORMAL LOW
HCT: 41.9
MCHC: 34.1
MCHC: 35.1
MCV: 91
MCV: 91.2
Platelets: 186
Platelets: 236
RBC: 3.83 — ABNORMAL LOW
RDW: 13.5
RDW: 14.2
WBC: 12.4 — ABNORMAL HIGH
WBC: 4.9

## 2011-03-05 LAB — DIFFERENTIAL
Basophils Relative: 0
Eosinophils Absolute: 0
Eosinophils Relative: 0
Neutrophils Relative %: 84 — ABNORMAL HIGH

## 2011-03-05 LAB — CSF CULTURE W GRAM STAIN
Culture: NO GROWTH
Gram Stain: NONE SEEN

## 2011-03-05 LAB — INFLUENZA A+B VIRUS AG-DIRECT(RAPID): Inflenza A Ag: NEGATIVE

## 2011-03-05 LAB — PREGNANCY, URINE: Preg Test, Ur: NEGATIVE

## 2011-03-05 LAB — CULTURE, BLOOD (ROUTINE X 2)
Culture: NO GROWTH
Culture: NO GROWTH

## 2011-03-05 LAB — POCT I-STAT, CHEM 8
Glucose, Bld: 116 — ABNORMAL HIGH
HCT: 45
Hemoglobin: 15.3 — ABNORMAL HIGH
Potassium: 3 — ABNORMAL LOW
Sodium: 136
TCO2: 25

## 2011-03-05 LAB — PROTIME-INR: Prothrombin Time: 15.1

## 2011-03-05 LAB — PHOSPHORUS: Phosphorus: 2.3

## 2011-03-05 LAB — MAGNESIUM: Magnesium: 2

## 2011-03-05 LAB — SEDIMENTATION RATE: Sed Rate: 22

## 2011-03-06 LAB — BASIC METABOLIC PANEL
BUN: 3 — ABNORMAL LOW
Calcium: 9
Creatinine, Ser: 0.79
GFR calc non Af Amer: >60
Glucose, Bld: 104 — ABNORMAL HIGH
Potassium: 4.3

## 2011-03-06 LAB — CBC
HCT: 36.3
Platelets: 235
RDW: 14.5

## 2011-03-11 LAB — DIFFERENTIAL
Basophils Absolute: 0.1
Lymphocytes Relative: 23
Monocytes Absolute: 0.5
Neutro Abs: 6.1

## 2011-03-11 LAB — CBC
Hemoglobin: 13.5
Platelets: 321
RDW: 13.8

## 2011-03-14 LAB — CBC
HCT: 32.6 — ABNORMAL LOW
HCT: 38.4
Hemoglobin: 11.3 — ABNORMAL LOW
Hemoglobin: 13.5
MCHC: 34.7
MCV: 95.2
RBC: 3.42 — ABNORMAL LOW
WBC: 11.1 — ABNORMAL HIGH

## 2011-04-29 ENCOUNTER — Encounter: Payer: Self-pay | Admitting: Emergency Medicine

## 2011-04-29 ENCOUNTER — Other Ambulatory Visit: Payer: Self-pay

## 2011-04-29 ENCOUNTER — Emergency Department (HOSPITAL_COMMUNITY)
Admission: EM | Admit: 2011-04-29 | Discharge: 2011-04-29 | Disposition: A | Discharge status: Home / Self Care | Payer: Medicaid Other | Attending: Emergency Medicine | Admitting: Emergency Medicine

## 2011-04-29 DIAGNOSIS — Z9889 Other specified postprocedural states: Secondary | ICD-10-CM | POA: Insufficient documentation

## 2011-04-29 DIAGNOSIS — R55 Syncope and collapse: Secondary | ICD-10-CM | POA: Insufficient documentation

## 2011-04-29 MED ORDER — SODIUM CHLORIDE 0.9 % IV BOLUS (SEPSIS)
1000.0000 mL | Freq: Once | INTRAVENOUS | Status: AC
  Administered 2011-04-29: 1000 mL via INTRAVENOUS
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
## 2011-04-29 NOTE — ED Notes (Signed)
ZOX:[id number]<AV> Expected date:04/29/11<BR> Expected time: 8:18 PM<BR> Means of arrival:Ambulance<BR> Comments:<BR> Syncopal episode after donating plasma

## 2011-08-07 ENCOUNTER — Emergency Department (HOSPITAL_COMMUNITY): Payer: No Typology Code available for payment source

## 2011-08-07 ENCOUNTER — Encounter (HOSPITAL_COMMUNITY): Payer: Self-pay | Admitting: *Deleted

## 2011-08-07 ENCOUNTER — Emergency Department (HOSPITAL_COMMUNITY)
Admission: EM | Admit: 2011-08-07 | Discharge: 2011-08-07 | Disposition: A | Discharge status: Home / Self Care | Payer: No Typology Code available for payment source | Attending: Emergency Medicine | Admitting: Emergency Medicine

## 2011-08-07 DIAGNOSIS — M545 Low back pain, unspecified: Secondary | ICD-10-CM | POA: Insufficient documentation

## 2011-08-07 DIAGNOSIS — R109 Unspecified abdominal pain: Secondary | ICD-10-CM | POA: Insufficient documentation

## 2011-08-07 DIAGNOSIS — J45909 Unspecified asthma, uncomplicated: Secondary | ICD-10-CM | POA: Insufficient documentation

## 2011-08-07 HISTORY — DX: Endometriosis, unspecified: N80.9

## 2011-08-07 MED ORDER — CYCLOBENZAPRINE HCL 5 MG PO TABS: 5.0000 mg | ORAL_TABLET | Freq: Three times a day (TID) | ORAL | Status: AC | PRN
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
## 2011-08-07 NOTE — ED Provider Notes (Signed)
Medical screening examination/treatment/procedure(s) were performed by non-physician practitioner and as supervising physician I was immediately available for consultation/collaboration. Devoria Albe, MD, FACEP   Ward Givens, MD 08/07/11 [phone]
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
## 2011-08-26 ENCOUNTER — Ambulatory Visit: Payer: No Typology Code available for payment source | Attending: Orthopedic Surgery | Admitting: Rehabilitation

## 2011-08-26 DIAGNOSIS — IMO0001 Reserved for inherently not codable concepts without codable children: Secondary | ICD-10-CM | POA: Insufficient documentation

## 2011-08-26 DIAGNOSIS — M545 Low back pain, unspecified: Secondary | ICD-10-CM | POA: Insufficient documentation

## 2011-09-02 ENCOUNTER — Ambulatory Visit: Payer: No Typology Code available for payment source | Admitting: Rehabilitation

## 2011-09-04 ENCOUNTER — Ambulatory Visit: Payer: No Typology Code available for payment source | Admitting: Rehabilitation

## 2011-09-09 ENCOUNTER — Encounter: Payer: No Typology Code available for payment source | Admitting: Rehabilitation

## 2011-09-11 ENCOUNTER — Ambulatory Visit: Payer: No Typology Code available for payment source | Attending: Orthopedic Surgery | Admitting: Rehabilitation

## 2011-09-11 DIAGNOSIS — M545 Low back pain, unspecified: Secondary | ICD-10-CM | POA: Insufficient documentation

## 2011-09-11 DIAGNOSIS — IMO0001 Reserved for inherently not codable concepts without codable children: Secondary | ICD-10-CM | POA: Insufficient documentation

## 2011-09-16 ENCOUNTER — Ambulatory Visit: Payer: No Typology Code available for payment source | Admitting: Rehabilitation

## 2011-09-18 ENCOUNTER — Ambulatory Visit: Payer: No Typology Code available for payment source | Admitting: Rehabilitation

## 2011-09-23 ENCOUNTER — Encounter: Payer: No Typology Code available for payment source | Admitting: Rehabilitation

## 2011-09-23 ENCOUNTER — Ambulatory Visit: Payer: No Typology Code available for payment source | Admitting: Rehabilitation

## 2011-09-25 ENCOUNTER — Encounter: Payer: No Typology Code available for payment source | Admitting: Rehabilitation

## 2011-11-11 ENCOUNTER — Encounter: Payer: Self-pay | Admitting: Family Medicine

## 2011-11-11 ENCOUNTER — Ambulatory Visit: Payer: Medicaid Other | Admitting: Family Medicine

## 2011-11-11 DIAGNOSIS — Z531 Procedure and treatment not carried out because of patient's decision for reasons of belief and group pressure: Secondary | ICD-10-CM | POA: Insufficient documentation

## 2011-11-11 DIAGNOSIS — IMO0001 Reserved for inherently not codable concepts without codable children: Secondary | ICD-10-CM | POA: Insufficient documentation

## 2012-03-19 ENCOUNTER — Ambulatory Visit (INDEPENDENT_AMBULATORY_CARE_PROVIDER_SITE_OTHER): Payer: Medicaid Other | Admitting: Family Medicine

## 2012-03-19 ENCOUNTER — Encounter: Payer: Self-pay | Admitting: Family Medicine

## 2012-03-19 VITALS — BP 112/73 | HR 92 | Temp 98.8°F | Ht 64.75 in | Wt 215.0 lb

## 2012-03-19 DIAGNOSIS — F17200 Nicotine dependence, unspecified, uncomplicated: Secondary | ICD-10-CM

## 2012-03-19 DIAGNOSIS — H547 Unspecified visual loss: Secondary | ICD-10-CM

## 2012-03-19 DIAGNOSIS — Z720 Tobacco use: Secondary | ICD-10-CM

## 2012-03-19 DIAGNOSIS — N800 Endometriosis of uterus: Secondary | ICD-10-CM

## 2012-03-19 NOTE — Progress Notes (Signed)
  Subjective:    Patient ID: [name], female    DOB: 06-Dec-1985, 26 y.o.   MRN: [id number]  HPI  [name] comes into clinic to establish care.  She has a few complaints:   Has Adenomyosis with significant pelvic pain and bleeding.  She had a uterine ablation in 2011.  She complains of continued pain that is daily and severely limits her quality of life and ability to be intimate.    She also complains of difficulty with her distance vision.  She thinks she needs glasses.   She smokes about 1/2 ppd.  She thinks this is pretty good.  She has asthma but says her smoking does not affect her asthma.  She says she smokes outside and not around her children so it does not affect them.    Past Medical History  Diagnosis Date  . Adenomyosis 2012    Planning hysterectomy   . 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 Asthma 1988   Family History  Problem Relation Age of Onset  . Depression Sister   . Depression Mother   . Diabetes Father    History  Substance Use Topics  . Smoking status: Current Every Day Smoker -- 0.5 packs/day  . Smokeless tobacco: Never Used  . Alcohol Use: 0.6 oz/week    1 Cans of beer per week     ocassionally     Review of Systems     Objective:   Physical Exam BP 112/73  Pulse 92  Temp 98.8 F (37.1 C) (Oral)  Ht 5' 4.75" (1.645 m)  Wt 215 lb (97.523 kg)  BMI 36.05 kg/m2 General appearance: alert, cooperative and no distress Lungs: clear to auscultation bilaterally Heart: regular rate and rhythm, S1, S2 normal, no murmur, click, rub or gallop Extremities: extremities normal, atraumatic, no cyanosis or edema Pulses: 2+ and symmetric       Assessment & Plan:

## 2012-03-19 NOTE — Patient Instructions (Signed)
It was good to see you.  I will order the referrals for you to see your OBGYN and the Eye doctor.  Please continue to try to cut down on your smoking.  Please come back and see me in about 6 months or sooner if needed.

## 2012-03-19 NOTE — Assessment & Plan Note (Signed)
Visual acuity decreased, will refer to Optometry for evaluation and glasses.

## 2012-03-19 NOTE — Assessment & Plan Note (Signed)
Discussed dangers of smoking in general, and in relation to Asthma and for children.  Patient not currently interested in quitting.

## 2012-03-19 NOTE — Assessment & Plan Note (Signed)
Long history of problems from Adenomyosis.  Will refer her back to [facility].

## 2012-03-29 ENCOUNTER — Encounter: Payer: Self-pay | Admitting: Obstetrics and Gynecology

## 2012-03-29 ENCOUNTER — Ambulatory Visit (INDEPENDENT_AMBULATORY_CARE_PROVIDER_SITE_OTHER): Payer: Medicaid Other | Admitting: Obstetrics and Gynecology

## 2012-03-29 VITALS — BP 110/80 | Temp 99.0°F | Ht 64.0 in | Wt 218.0 lb

## 2012-03-29 DIAGNOSIS — Z01419 Encounter for gynecological examination (general) (routine) without abnormal findings: Secondary | ICD-10-CM

## 2012-03-29 DIAGNOSIS — N9089 Other specified noninflammatory disorders of vulva and perineum: Secondary | ICD-10-CM

## 2012-03-29 DIAGNOSIS — N949 Unspecified condition associated with female genital organs and menstrual cycle: Secondary | ICD-10-CM

## 2012-03-29 DIAGNOSIS — Z0000 Encounter for general adult medical examination without abnormal findings: Secondary | ICD-10-CM

## 2012-03-29 DIAGNOSIS — R102 Pelvic and perineal pain: Secondary | ICD-10-CM

## 2012-03-29 DIAGNOSIS — Z124 Encounter for screening for malignant neoplasm of cervix: Secondary | ICD-10-CM

## 2012-03-29 LAB — POCT URINALYSIS DIPSTICK
Protein, UA: NEGATIVE
Urobilinogen, UA: NEGATIVE
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
## 2012-03-29 NOTE — Patient Instructions (Addendum)
Hysterectomy Information   A hysterectomy is a procedure where your uterus is surgically removed. It will no longer be possible to have menstrual periods or to become pregnant. The tubes and ovaries can be removed (bilateral salpingo-oopherectomy) during this surgery as well.    REASONS FOR A HYSTERECTOMY  · Persistent, abnormal bleeding.  · Lasting (chronic) pelvic pain or infection.  · The lining of the uterus (endometrium) starts growing outside the uterus (endometriosis).  · The endometrium starts growing in the muscle of the uterus (adenomyosis).  · The uterus falls down into the vagina (pelvic organ prolapse).  · Symptomatic uterine fibroids.  · Precancerous cells.  · Cervical cancer or uterine cancer.  TYPES OF HYSTERECTOMIES  · Supracervical hysterectomy. This type removes the top part of the uterus, but not the cervix.  · Total hysterectomy. This type removes the uterus and cervix.  · Radical hysterectomy. This type removes the uterus, cervix, and the fibrous tissue that holds the uterus in place in the pelvis (parametrium).  WAYS A HYSTERECTOMY CAN BE PERFORMED  · Abdominal hysterectomy. A large surgical cut (incision) is made in the abdomen. The uterus is removed through this incision.  · Vaginal hysterectomy. An incision is made in the vagina. The uterus is removed through this incision. There are no abdominal incisions.  · Conventional laparoscopic hysterectomy. A thin, lighted tube with a camera (laparoscope) is inserted into 3 or 4 small incisions in the abdomen. The uterus is cut into small pieces. The small pieces are removed through the incisions, or they are removed through the vagina.  · Laparoscopic assisted vaginal hysterectomy (LAVH). Three or four small incisions are made in the abdomen. Part of the surgery is performed laparoscopically and part vaginally. The uterus is removed through the vagina.  · Robot-assisted laparoscopic hysterectomy. A laparoscope is inserted into 3 or 4 small  incisions in the abdomen. A computer-controlled device is used to give the surgeon a 3D image. This allows for more precise movements of surgical instruments. The uterus is cut into small pieces and removed through the incisions or removed through the vagina.  RISKS OF HYSTERECTOMY    · Bleeding and risk of blood transfusion. Tell your caregiver if you do not want to receive any blood products.  · Blood clots in the legs or lung.  · Infection.  · Injury to surrounding organs.  · Anesthesia problems or side effects.  · Conversion to an abdominal hysterectomy.  WHAT TO EXPECT AFTER A HYSTERECTOMY  · You will be given pain medicine.  · You will need to have someone with you for the first 3 to 5 days after you go home.  · You will need to follow up with your surgeon in 2 to 4 weeks after surgery to evaluate your progress.  · You may have early menopause symptoms like hot flashes, night sweats, and insomnia.  · If you had a hysterectomy for a problem that was not a cancer or a condition that could lead to cancer, then you no longer need Pap tests. However, even if you no longer need a Pap test, a regular exam is a good idea to make sure no other problems are starting.  Document Released: 11/19/2000 Document Revised: 08/18/2011 Document Reviewed: 01/04/2011  [employer]® Patient Information ©2013 [employer].

## 2012-03-29 NOTE — Progress Notes (Signed)
Regular Periods: no Mammogram: no  Monthly Breast Ex.: no Exercise: yes  Tetanus < 10 years: yes Seatbelts: yes  NI. Bladder Functn.: yes Abuse at home: no  Daily BM's: yes Stressful Work: no  Healthy Diet: yes Sigmoid-Colonoscopy: NO  Calcium: no Medical problems this year: WANT TO DISCUSS HAVING HYSTERECTOMY   LAST PAP:2011  Contraception: BTL  Mammogram:  NO  PCP: DR. CHAMBERLAIN  PMH: NO CHANGE  FMH: NO CHANGE  Last Bone Scan: NO  PT IS SINGLE;IN  A RELATIONSHIP

## 2012-03-30 LAB — PAP IG, CT-NG, RFX HPV ASCU
Chlamydia Probe Amp: NEGATIVE
GC Probe Amp: NEGATIVE

## 2012-03-31 ENCOUNTER — Telehealth: Payer: Self-pay

## 2012-03-31 LAB — HERPES SIMPLEX VIRUS CULTURE: Organism ID, Bacteria: DETECTED

## 2012-03-31 NOTE — Telephone Encounter (Signed)
LM FOR PT TO CALL BACK. 

## 2012-04-01 ENCOUNTER — Telehealth: Payer: Self-pay

## 2012-04-01 NOTE — Telephone Encounter (Signed)
LM FOR PT TO CALL BACK. 

## 2012-04-02 ENCOUNTER — Telehealth: Payer: Self-pay

## 2012-04-02 NOTE — Telephone Encounter (Signed)
TC TO PT REGARDING HSV CULTURE. INFORMED PT THAT HSV CULTURE CAME BACK POSITIVE BUT WAS NOT ENOUGH SPECIMEN TO DETERMINE WHETHER IT WAS HSV 1 OR HSV 2. GAVE PT WEBSITE TO RESEARCH MORE BUT PT WANT APPT TO DISCUSS. SCHEDULED PT APPT ON 04-07-12 WITH EP. PT VOICED UNDERSTANDING.

## 2012-04-02 NOTE — Telephone Encounter (Signed)
LM FOR PT TO CALL BACK. 

## 2012-04-07 ENCOUNTER — Encounter: Payer: Medicaid Other | Admitting: Obstetrics and Gynecology

## 2012-04-12 ENCOUNTER — Encounter: Payer: Medicaid Other | Admitting: Obstetrics and Gynecology

## 2012-05-18 ENCOUNTER — Ambulatory Visit (INDEPENDENT_AMBULATORY_CARE_PROVIDER_SITE_OTHER): Payer: Medicaid Other | Admitting: Obstetrics and Gynecology

## 2012-05-18 ENCOUNTER — Encounter: Payer: Self-pay | Admitting: Obstetrics and Gynecology

## 2012-05-18 ENCOUNTER — Other Ambulatory Visit: Payer: Self-pay | Admitting: Obstetrics and Gynecology

## 2012-05-18 VITALS — BP 100/70 | Ht 62.0 in | Wt 216.0 lb

## 2012-05-18 DIAGNOSIS — N920 Excessive and frequent menstruation with regular cycle: Secondary | ICD-10-CM

## 2012-05-18 DIAGNOSIS — N946 Dysmenorrhea, unspecified: Secondary | ICD-10-CM

## 2012-05-18 DIAGNOSIS — Z113 Encounter for screening for infections with a predominantly sexual mode of transmission: Secondary | ICD-10-CM

## 2012-05-18 MED ORDER — VALACYCLOVIR HCL 500 MG PO TABS: 500.0000 mg | ORAL_TABLET | Freq: Every day | ORAL | Status: DC

## 2012-05-18 NOTE — Progress Notes (Signed)
Pt c/o heavy bleeding and pain and even when she doesn't have cycle at times she feels the pain (contractions she describes).  Questions about HSV + cx.  Filed Vitals:   05/18/12 1507  BP: 100/70   A/P Pt wants hysterectomy if it is her uterus. She never saw GI as rec so will get referral to GI Next available for u/s and embx If GI thinks there is no GI component, then pt will proceed with hysterectomy She will observe and take pain meds prn Questions answered, rec valtrex and pt will get blood drawn for HSV 1 and 2 today

## 2012-05-18 NOTE — Addendum Note (Signed)
Addended by: Marla Roe A on: 05/18/2012 04:58 PM   Modules accepted: Orders

## 2012-05-20 ENCOUNTER — Telehealth: Payer: Self-pay

## 2012-05-20 LAB — HSV(HERPES SIMPLEX VRS) I + II AB-IGG: HSV 2 Glycoprotein G Ab, IgG: 9.71 IV — ABNORMAL HIGH

## 2012-05-20 NOTE — Telephone Encounter (Signed)
Spoke to receptionist at Dr. Haywood Pao office to refer pt to either Dr. Elnoria Howard or Dr. Loreta Ave. I faxed over notes, demographics and Ins info. They will contact pt to schedule eval. [name]

## 2012-05-21 ENCOUNTER — Telehealth: Payer: Self-pay | Admitting: Obstetrics and Gynecology

## 2012-05-21 NOTE — Telephone Encounter (Signed)
Left message for patient to return call. Pt need to be informed of next appts and to be advised that Dr. Su Hilt has not reviewed HSV results. [name]

## 2012-05-21 NOTE — Telephone Encounter (Signed)
Ar pt 

## 2012-05-21 NOTE — Telephone Encounter (Signed)
Pt wants test results. Pt has questions about when u/s and biopsy will be scheduled as well

## 2012-05-24 ENCOUNTER — Telehealth: Payer: Self-pay

## 2012-05-24 NOTE — Telephone Encounter (Signed)
LM for pt to cb re: referral. She actually needs referral from her PCP. Records have already been sent, however. I also need to speak with her about a couple of other things. Pt will call my direct ext. Later Levin Erp

## 2012-05-24 NOTE — Telephone Encounter (Signed)
Ar pt 

## 2012-05-25 ENCOUNTER — Ambulatory Visit: Payer: Medicaid Other | Admitting: Family Medicine

## 2012-05-27 ENCOUNTER — Encounter: Payer: Self-pay | Admitting: Family Medicine

## 2012-05-27 ENCOUNTER — Ambulatory Visit (INDEPENDENT_AMBULATORY_CARE_PROVIDER_SITE_OTHER): Payer: Medicaid Other | Admitting: Family Medicine

## 2012-05-27 VITALS — BP 121/79 | HR 92 | Ht 64.5 in | Wt 215.0 lb

## 2012-05-27 DIAGNOSIS — R109 Unspecified abdominal pain: Secondary | ICD-10-CM

## 2012-05-27 DIAGNOSIS — G8929 Other chronic pain: Secondary | ICD-10-CM | POA: Insufficient documentation

## 2012-05-27 NOTE — Assessment & Plan Note (Signed)
I had a long conversation with patient regarding her problems.  Discussed that I do not think this is necessarily related to her GI tract, but that I think it is reasonable to see GI before having a hysterectomy.  I told her that my concern is that she has a chronic pain syndrome and that a hysterectomy might not help her pain. I offered to try a medication for chronic pain like gabapentin, but she declined today.  Will order referral to GI.

## 2012-05-27 NOTE — Progress Notes (Signed)
  Subjective:    Patient ID: [name], female    DOB: [birth year], 26 y.o.   MRN: [id number]  HPI  [name] comes in for follow up of her chronic abdominal/pelvic pain.  She is very frustrated with her Obstetrician and has gotten a Clinical research associate.  She says that she has had chronic pelvic pain since 2010, and at that time she had an endometrial biopsy and was diagnosed with Adenomyosis.  This is an unusual diagnosis for a young women, most women with this are post-menopausal.  She says her OB did a tubal ligation and endometrial ablation.  She says her symptoms may have been a little bit better for a few months after that, but the pain and bleeding have gotten worse again.  She says her OB put her on diclofenac but this did not help.  She says her pain is all the time, and she has abdominal bloating and constipation along with the pain.  Her OB has told her that she has to see Gastroenterology about the pain before they are willing to do a hysterectomy.    Review of Systems See HPI    Objective:   Physical Exam BP 121/79  Pulse 92  Ht 5' 4.5" (1.638 m)  Wt 215 lb (97.523 kg)  BMI 36.33 kg/m2 General appearance: alert, cooperative and no distress Abdomen: soft, +BS, obese, mild lower abdominal TTP, no rebound or guarding, no hepatosplenomegaly.        Assessment & Plan:

## 2012-05-27 NOTE — Patient Instructions (Signed)
It was good to see you.  The office will call you with your Gastroenterology appointment.   Happy Holidays!

## 2012-06-07 ENCOUNTER — Telehealth: Payer: Self-pay

## 2012-06-07 NOTE — Telephone Encounter (Signed)
LM for pt to cb re: a couple things I need to speak to her about. Melody Comas A

## 2012-06-08 ENCOUNTER — Telehealth: Payer: Self-pay

## 2012-06-08 NOTE — Telephone Encounter (Signed)
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 I offered appt to discuss specifics about the virus/ and or to mail literature re: HSV. Pt declines at this time. I also discussed GI referral and the fact that PCP must initiate referral. Pt states that PCP is working on it. Pt will call back to R/S appts to f/u with AR if she is unable to see GI Doctor before the 7th. Melody Comas A

## 2012-06-08 NOTE — Telephone Encounter (Signed)
LM for pt to cb re: test results and recent referral to GI that needs PCP referral. I have been unable to connect w/ pt to let her know. Melody Comas A

## 2012-06-08 NOTE — Telephone Encounter (Signed)
Spoke to pt re labs and referral. Levin Erp

## 2012-06-11 ENCOUNTER — Telehealth: Payer: Self-pay | Admitting: Obstetrics and Gynecology

## 2012-06-11 NOTE — Telephone Encounter (Signed)
Ar pt 

## 2012-06-15 ENCOUNTER — Encounter: Payer: Medicaid Other | Admitting: Obstetrics and Gynecology

## 2012-06-15 ENCOUNTER — Other Ambulatory Visit: Payer: Medicaid Other

## 2012-07-09 ENCOUNTER — Telehealth: Payer: Self-pay | Admitting: Obstetrics and Gynecology

## 2012-07-09 NOTE — Telephone Encounter (Signed)
TC TO PT REGARDING MESSAGE. PT STATES THAT SHE HAS AN APPT WITH THE GASTRO DOCTOR ON Monday 07/12/12 AND SHE STATES THAT AR WANTED TO FOLLOW UP WITH THE PT WHEN SHE SCHEDULED THE APPT. I TOLD PT TO GO TO THE APPT AND GET THE DOCTOR TO FAX AR THE REPORT AND THEN F/U WITH AR TO SEE WHAT THE NEXT STEP IS. PT STATES THAT AR WANTED PT TO HAVE AN U/S AND EBX. LET PT KNOW THAT I WILL LET AR'S MEDICAL ASSISTANT KNOW THAT YOU ARE HAVING THE APPT AND WE WILL GO FROM THERE. PT VOICED UNDERSTANDING.

## 2012-07-12 ENCOUNTER — Encounter: Payer: Self-pay | Admitting: Internal Medicine

## 2012-07-12 ENCOUNTER — Ambulatory Visit (INDEPENDENT_AMBULATORY_CARE_PROVIDER_SITE_OTHER): Payer: Medicaid Other | Admitting: Internal Medicine

## 2012-07-12 VITALS — BP 100/60 | HR 70 | Ht 64.5 in | Wt 214.0 lb

## 2012-07-12 DIAGNOSIS — N949 Unspecified condition associated with female genital organs and menstrual cycle: Secondary | ICD-10-CM

## 2012-07-12 DIAGNOSIS — R102 Pelvic and perineal pain: Secondary | ICD-10-CM

## 2012-07-12 NOTE — Progress Notes (Signed)
Subjective:  Referred by: Ardyth Gal, MD   Patient ID: [name], female    DOB: 10-04-1985, 27 y.o.   MRN: [id number]  HPI Is a very nice young [place] American woman with chronic pelvic pain. She has a diagnosis of adenomyosis of the uterus. She would like a hysterectomy but before proceeding to that gynecology has requested GI consultation to see if there was any consideration for gastrointestinal causes of her pain. She reports a regular daily pain in the suprapubic and pelvic area. It comes and goes and is worse when she would typically cycle and menstruate though she has not done so since a 2011 endometrial ablation. She thinks things have worsened after that time. She has passed some clots at times as well even though she's not supposed to have menses. She is not having any disturbance of her bowel function, the pain is not related to eating or defecation. Her is rare heartburn. There is occasional mild constipation but nothing chronic and bothersome.  Allergies  Allergen Reactions  . Penicillins Hives   Outpatient Prescriptions Prior to Visit  Medication Sig Dispense Refill  . diclofenac (VOLTAREN) 50 MG EC tablet Take 50 mg by mouth 2 (two) times daily.        . valACYclovir (VALTREX) 500 MG tablet Take 1 tablet (500 mg total) by mouth daily.  30 tablet  11  . [DISCONTINUED] albuterol (PROVENTIL) (2.5 MG/3ML) 0.083% nebulizer solution Take 2.5 mg by nebulization every 6 (six) hours as needed.         Last reviewed on 07/12/2012 12:24 PM by Iva Boop, MD Past Medical History  Diagnosis Date  . Adenomyosis 2012    Planning hysterectomy   . 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 Asthma 1988  . Thyromegaly     H/O  . ADHD (attention deficit hyperactivity disorder)     NO MEDS  . 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   Past Surgical History  Procedure Date  . Incise and drain abcess     surgical incision, wound produced mrsa  . Endometrial ablation 2011  . Tubal ligation  2010   History   Social History  . Marital Status: Single    Spouse Name: N/A    Number of Children: 3  . Years of Education: some HS   Occupational History  . Unemployed     Social History Main Topics  . Smoking status: Current Every Day Smoker -- 0.5 packs/day    Types: Cigarettes  . Smokeless tobacco: Never Used  . Alcohol Use: 0.6 oz/week    1 Cans of beer per week     Comment: ocassionally  . Drug Use: No  . Sexually Active: Not Currently    Birth Control/ Protection: Condom, Surgical     Comment: BTL          Social History Narrative   Unemployed currently. Single Lives with 3 children (2, 4 and 6). Working toward [employer]. Jehovah Witness.    Family History  Problem Relation Age of Onset  . Depression Sister   . Depression Mother   . Diabetes Father   . Alzheimer's disease Maternal Grandfather   . Ovarian cancer      maternal great grandmother  . Uterine cancer      maternal great aunt  . Testicular cancer Cousin     maternal cousin       Review of Systems This is positive for allergies, breasts  becomes sore and tender at times she is felt thirsty, she has chronic intermittent headaches back pain and muscle pains. All other review of systems are negative at this time.    Objective:   Physical Exam General:  Well-developed, well-nourished and in no acute distress Eyes:  anicteric. ENT:   Mouth and posterior pharynx free of lesions.  Neck:   supple w/o thyromegaly or mass.  Lungs: Clear to auscultation bilaterally. Heart:  S1S2, no rubs, murmurs, gallops. Abdomen:  obese, soft, no hepatosplenomegaly, hernia, or mass and BS+. She is moderately tender in suprapubic area, without guarding or rebound Lymph:  no cervical or supraclavicular adenopathy. Extremities:   no edema Skin   no rash. Neuro:  A&O x 3.  Psych:  appropriate mood and  Affect.   Data Reviewed: Pelvic US results Endometrial ablation note 2011       Assessment & Plan:   1. Chronic  pelvic pain in female    1. I think ts is gynecologic in origin or at least not gastrointestinal and would not do endoscopy studies. 2. I think pelvic MRI would possibly make sense prior to any surgery but would defer to Dr. Su Hilt as she knows the overall history best and that may not change the decision to proceed to hysterectomy.  CC: Ardyth Gal, MD and Osborn Coho, MD

## 2012-07-12 NOTE — Patient Instructions (Addendum)
You have been given a separate informational sheet regarding your tobacco use, the importance of quitting and local resources to help you quit.  I do not recommend any specific GI testing. I think your problem is gynecologic and not gastrointestinal.  Could consider pelvic CT or MRI prior to surgery - I will recommend to Dr. Su Hilt.

## 2012-07-13 ENCOUNTER — Telehealth: Payer: Self-pay

## 2012-07-15 ENCOUNTER — Telehealth: Payer: Self-pay

## 2012-07-26 ENCOUNTER — Other Ambulatory Visit: Payer: Self-pay | Admitting: Obstetrics and Gynecology

## 2012-07-26 ENCOUNTER — Ambulatory Visit: Payer: Medicaid Other | Admitting: Obstetrics and Gynecology

## 2012-07-26 ENCOUNTER — Ambulatory Visit: Payer: Medicaid Other

## 2012-07-26 ENCOUNTER — Encounter: Payer: Self-pay | Admitting: Obstetrics and Gynecology

## 2012-07-26 VITALS — BP 100/66 | Resp 14 | Ht 64.5 in | Wt 213.0 lb

## 2012-07-26 DIAGNOSIS — N920 Excessive and frequent menstruation with regular cycle: Secondary | ICD-10-CM

## 2012-07-26 DIAGNOSIS — N946 Dysmenorrhea, unspecified: Secondary | ICD-10-CM

## 2012-07-26 NOTE — Progress Notes (Signed)
Here for f/u u/s and EmBx following GI consult that said pelvic pain is unlikely GI related Pt says the biggest problem is the pain  Filed Vitals:   07/26/12 1746  BP: 100/66  Resp: 14   ROS: noncontributory  Pelvic exam:  VULVA: normal appearing vulva with no masses, tenderness or lesions,  VAGINA: normal appearing vagina with normal color and discharge, no lesions, CERVIX: normal appearing cervix without discharge or lesions,  UTERUS: uterus is normal size, shape, consistency and nontender,  ADNEXA: normal adnexa in size, nontender and no masses.  EmBx Performed per protocol pipelle passed x 2 to 6-7cm No probs but very uncomfortable  U/S Ut 5.5x3.8x4.6cm, nl bil ovaries, 1.4 cm vaginal cyst (not clearly visible on pelvic exam and not palpable)  A/P R/B/A discussed - Pt still leaning toward hysterectomy because she says she has tried everything else.  She has h/o BTL and endometrial ablation.  Has previosuly tried hormonal mgmt incl depo and mirena. She will f/u in 1-2wks and let me know her decision Pamphlet on hysterectomy I have told pt previously she says that it is likely adenomyosis

## 2012-07-28 LAB — PATHOLOGY

## 2012-08-17 NOTE — Telephone Encounter (Signed)
Note to close enc. Oakley, Jacqueline A  

## 2012-08-17 NOTE — Telephone Encounter (Signed)
Note to close enc. [name], Kristina A  

## 2012-08-23 ENCOUNTER — Ambulatory Visit: Payer: Medicaid Other | Admitting: Obstetrics and Gynecology

## 2012-09-02 ENCOUNTER — Ambulatory Visit: Payer: Medicaid Other

## 2012-09-03 ENCOUNTER — Ambulatory Visit (INDEPENDENT_AMBULATORY_CARE_PROVIDER_SITE_OTHER): Payer: Medicaid Other | Admitting: Family Medicine

## 2012-09-03 ENCOUNTER — Encounter: Payer: Self-pay | Admitting: Family Medicine

## 2012-09-03 VITALS — BP 112/78 | HR 99 | Temp 98.8°F | Ht 64.5 in | Wt 211.8 lb

## 2012-09-03 DIAGNOSIS — F909 Attention-deficit hyperactivity disorder, unspecified type: Secondary | ICD-10-CM | POA: Insufficient documentation

## 2012-09-03 DIAGNOSIS — R1110 Vomiting, unspecified: Secondary | ICD-10-CM | POA: Insufficient documentation

## 2012-09-03 DIAGNOSIS — R197 Diarrhea, unspecified: Secondary | ICD-10-CM

## 2012-09-03 MED ORDER — PROMETHAZINE HCL 12.5 MG PO TABS: 12.5000 mg | ORAL_TABLET | Freq: Three times a day (TID) | ORAL | Status: DC | PRN

## 2012-09-03 NOTE — Progress Notes (Signed)
  Subjective:    Patient ID: [name], female    DOB: Apr 29, 1986, 27 y.o.   MRN: [id number]  HPI:  Shirlena comes in for follow up.  She has been having diarrhea since Monday, and then Tuesday night started vomiting.  She felt very bad on Wednesday and Thursday with constant diarrhea and vomiting and body aches.  She says today she is not vomiting but is still a little nauseated.  She says she has had 4 episodes of diarrhea today.  Denies blood in stool or vomit.    She also says she is going to go back to school (start college).  She was a high school drop out and got her GED later.  She has a history of ADHD but has not been on medications in years.  She says when she got her GED paying attention was difficult for her.  She says when she was in high school she did not like taking the medications because of how they made her feel, and would pretend to take them so her mom did not know.  She says her grades suffered. She is afraid of failing out if she is not on medications.    Past Medical History  Diagnosis Date  . Adenomyosis 2012    Planning hysterectomy   . 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 Asthma 1988  . Thyromegaly     H/O  . ADHD (attention deficit hyperactivity disorder)     NO MEDS  . 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    History  Substance Use Topics  . Smoking status: Current Every Day Smoker -- 0.50 packs/day    Types: Cigarettes  . Smokeless tobacco: Never Used  . Alcohol Use: 0.6 oz/week    1 Cans of beer per week     Comment: ocassionally    Family History  Problem Relation Age of Onset  . Depression Sister   . Depression Mother   . Diabetes Father   . Alzheimer's disease Maternal Grandfather   . Ovarian cancer      maternal great grandmother  . Uterine cancer      maternal great aunt  . Testicular cancer Cousin     maternal cousin   ROS Pertinent items in HPI    Objective:  Physical Exam:  BP 112/78  Pulse 99  Temp(Src) 98.8 F (37.1 C) (Oral)  Ht  5' 4.5" (1.638 m)  Wt 211 lb 12.8 oz (96.072 kg)  BMI 35.81 kg/m2 General appearance: alert, cooperative and no distress Head: Normocephalic, without obvious abnormality, atraumatic Lungs: clear to auscultation bilaterally Heart: regular rate and rhythm, S1, S2 normal, no murmur, click, rub or gallop Pulses: 2+ and symmetric     Assessment & Plan:

## 2012-09-03 NOTE — Assessment & Plan Note (Signed)
I have no records of her ADHD as we were not her physician when the diagnosis was made.  I have asked her to find records of when her Pscyhology evaluation and the diagnosis were made.  I did discuss that many adults grow out of ADHD and she may not need medications for her entire life.

## 2012-09-03 NOTE — Patient Instructions (Addendum)
Please try the phenergan as needed for nausea and vomiting.  Drink lots of fluids and do not use imodium until you are sure the virus is gone.   Please try to find your records from when you were diagnosed with ADHD- this will be a starting point to decide if medication treatment is right for you at your age.

## 2012-09-03 NOTE — Assessment & Plan Note (Signed)
Improving, no signs of dehydration.  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  Discussed importance of hydration, rx phenergan for nausea control.  Advised no imodium.

## 2013-03-03 ENCOUNTER — Encounter: Payer: Self-pay | Admitting: Obstetrics

## 2013-03-03 ENCOUNTER — Ambulatory Visit (INDEPENDENT_AMBULATORY_CARE_PROVIDER_SITE_OTHER): Payer: Medicaid Other | Admitting: Obstetrics

## 2013-03-03 VITALS — BP 104/71 | HR 78 | Temp 98.1°F | Ht 63.5 in | Wt 205.0 lb

## 2013-03-03 DIAGNOSIS — N921 Excessive and frequent menstruation with irregular cycle: Secondary | ICD-10-CM

## 2013-03-03 DIAGNOSIS — Z113 Encounter for screening for infections with a predominantly sexual mode of transmission: Secondary | ICD-10-CM

## 2013-03-03 DIAGNOSIS — N946 Dysmenorrhea, unspecified: Secondary | ICD-10-CM

## 2013-03-03 LAB — CBC WITH DIFFERENTIAL/PLATELET
Basophils Relative: 1 % (ref 0–1)
Eosinophils Absolute: 0.2 10*3/uL (ref 0.0–0.7)
Eosinophils Relative: 3 % (ref 0–5)
HCT: 41.3 % (ref 36.0–46.0)
Hemoglobin: 14.1 g/dL (ref 12.0–15.0)
Lymphs Abs: 2.8 10*3/uL (ref 0.7–4.0)
MCH: 30.6 pg (ref 26.0–34.0)
MCHC: 34.1 g/dL (ref 30.0–36.0)
Monocytes Absolute: 0.5 10*3/uL (ref 0.1–1.0)
Monocytes Relative: 8 % (ref 3–12)
Neutro Abs: 2.9 10*3/uL (ref 1.7–7.7)
Neutrophils Relative %: 44 % (ref 43–77)
Platelets: 371 10*3/uL (ref 150–400)
RBC: 4.61 MIL/uL (ref 3.87–5.11)

## 2013-03-03 LAB — COMPREHENSIVE METABOLIC PANEL
ALT: 16 U/L (ref 0–35)
Albumin: 4.3 g/dL (ref 3.5–5.2)
Alkaline Phosphatase: 83 U/L (ref 39–117)
Chloride: 104 mEq/L (ref 96–112)
Glucose, Bld: 82 mg/dL (ref 70–99)
Potassium: 4.5 mEq/L (ref 3.5–5.3)
Sodium: 140 mEq/L (ref 135–145)
Total Bilirubin: 0.3 mg/dL (ref 0.3–1.2)
Total Protein: 7.2 g/dL (ref 6.0–8.3)

## 2013-03-03 MED ORDER — IBUPROFEN 800 MG PO TABS: 800.0000 mg | ORAL_TABLET | Freq: Three times a day (TID) | ORAL | Status: DC | PRN

## 2013-03-03 NOTE — Progress Notes (Signed)
Subjective:     [name] is a 27 y.o. female here for a routine exam.  Current complaints: pt states she has been having fatigue, dizziness, nausea/vomiting, mood swings, cramping and headaches. Pt states she has a history of an endometrial ablation and a BTL. Pt states she has started having some blood clots about 3 days ago. Personal health questionnaire reviewed: yes.   Gynecologic History No LMP recorded. Patient has had an ablation. Contraception: condoms BTL, Ablation Last Pap: 2013. Results were: normal Last mammogram: n/a. Results were: n/a  Obstetric History OB History  Gravida Para Term Preterm AB SAB TAB Ectopic Multiple Living  3 3 3       3     # Outcome Date GA Lbr Len/2nd Weight Sex Delivery Anes PTL Lv  3 TRM           2 TRM           1 TRM                The following portions of the patient's history were reviewed and updated as appropriate: allergies, current medications, past family history, past medical history, past social history, past surgical history and problem list.  Review of Systems Pertinent items are noted in HPI.    Objective:    General appearance: alert and no distress Abdomen: normal findings: soft, non-tender Pelvic: cervix normal in appearance, external genitalia normal, no adnexal masses or tenderness, no cervical motion tenderness, uterus normal size, shape, and consistency and vagina normal without discharge    Assessment:    Healthy female exam.   S/P Endometrial Ablation.  Doing well.   Plan:    Education reviewed: safe sex/STD prevention. Contraception: tubal ligation. F/U for Annual.

## 2013-03-04 LAB — GC/CHLAMYDIA PROBE AMP
CT Probe RNA: NEGATIVE
GC Probe RNA: NEGATIVE

## 2013-03-07 ENCOUNTER — Encounter: Payer: Self-pay | Admitting: Obstetrics

## 2013-03-12 ENCOUNTER — Emergency Department (HOSPITAL_BASED_OUTPATIENT_CLINIC_OR_DEPARTMENT_OTHER)
Admission: EM | Admit: 2013-03-12 | Discharge: 2013-03-12 | Disposition: A | Discharge status: Home / Self Care | Payer: Worker's Compensation | Attending: Emergency Medicine | Admitting: Emergency Medicine

## 2013-03-12 ENCOUNTER — Encounter (HOSPITAL_BASED_OUTPATIENT_CLINIC_OR_DEPARTMENT_OTHER): Payer: Self-pay

## 2013-03-12 DIAGNOSIS — Z862 Personal history of diseases of the blood and blood-forming organs and certain disorders involving the immune mechanism: Secondary | ICD-10-CM | POA: Insufficient documentation

## 2013-03-12 DIAGNOSIS — Y9389 Activity, other specified: Secondary | ICD-10-CM | POA: Insufficient documentation

## 2013-03-12 DIAGNOSIS — Z8659 Personal history of other mental and behavioral disorders: Secondary | ICD-10-CM | POA: Insufficient documentation

## 2013-03-12 DIAGNOSIS — Z23 Encounter for immunization: Secondary | ICD-10-CM | POA: Insufficient documentation

## 2013-03-12 DIAGNOSIS — Y990 Civilian activity done for income or pay: Secondary | ICD-10-CM | POA: Insufficient documentation

## 2013-03-12 DIAGNOSIS — Z8619 Personal history of other infectious and parasitic diseases: Secondary | ICD-10-CM | POA: Insufficient documentation

## 2013-03-12 DIAGNOSIS — Z79899 Other long term (current) drug therapy: Secondary | ICD-10-CM | POA: Insufficient documentation

## 2013-03-12 DIAGNOSIS — Y9289 Other specified places as the place of occurrence of the external cause: Secondary | ICD-10-CM | POA: Insufficient documentation

## 2013-03-12 DIAGNOSIS — S61409A Unspecified open wound of unspecified hand, initial encounter: Secondary | ICD-10-CM | POA: Insufficient documentation

## 2013-03-12 DIAGNOSIS — F17200 Nicotine dependence, unspecified, uncomplicated: Secondary | ICD-10-CM | POA: Insufficient documentation

## 2013-03-12 DIAGNOSIS — Z880 Allergy status to penicillin: Secondary | ICD-10-CM | POA: Insufficient documentation

## 2013-03-12 DIAGNOSIS — S61411A Laceration without foreign body of right hand, initial encounter: Secondary | ICD-10-CM

## 2013-03-12 DIAGNOSIS — J45909 Unspecified asthma, uncomplicated: Secondary | ICD-10-CM | POA: Insufficient documentation

## 2013-03-12 DIAGNOSIS — Z8639 Personal history of other endocrine, nutritional and metabolic disease: Secondary | ICD-10-CM | POA: Insufficient documentation

## 2013-03-12 DIAGNOSIS — W278XXA Contact with other nonpowered hand tool, initial encounter: Secondary | ICD-10-CM | POA: Insufficient documentation

## 2013-03-12 MED ORDER — TETANUS-DIPHTH-ACELL PERTUSSIS 5-2.5-18.5 LF-MCG/0.5 IM SUSP
0.5000 mL | Freq: Once | INTRAMUSCULAR | Status: AC
  Administered 2013-03-12: 0.5 mL via INTRAMUSCULAR
  Filled 2013-03-12: qty 0.5

## 2013-03-12 MED ORDER — IBUPROFEN 800 MG PO TABS
800.0000 mg | ORAL_TABLET | Freq: Once | ORAL | Status: AC
  Administered 2013-03-12: 800 mg via ORAL
  Filled 2013-03-12: qty 1

## 2013-03-12 MED ORDER — SULFAMETHOXAZOLE-TRIMETHOPRIM 800-160 MG PO TABS: 1.0000 | ORAL_TABLET | Freq: Two times a day (BID) | ORAL | Status: DC

## 2013-03-12 NOTE — ED Notes (Signed)
Patient here with small laceration to right inner aspect of thumb/hand after getting stuck with needle nose pliers at work. No bleeding noted from wound

## 2013-03-12 NOTE — ED Provider Notes (Signed)
Medical screening examination/treatment/procedure(s) were performed by non-physician practitioner and as supervising physician I was immediately available for consultation/collaboration.   Junius Argyle, MD 03/12/13 [phone]

## 2013-03-12 NOTE — ED Provider Notes (Signed)
CSN: [id number]     Arrival date & time 03/12/13  1241 History   First MD Initiated Contact with Patient 03/12/13 1420     Chief Complaint  Patient presents with  . Hand Injury   (Consider location/radiation/quality/duration/timing/severity/associated sxs/prior Treatment) Patient is a 27 y.o. female presenting with hand injury. The history is provided by the patient.  Hand Injury Location:  Finger Injury: yes   Finger location:  R thumb Pain details:    Quality:  Sharp   Radiates to:  Does not radiate   Severity:  Moderate   Onset quality:  Sudden Chronicity:  New Handedness:  Right-handed Dislocation: no   Foreign body present:  No foreign bodies Tetanus status:  Out of date Prior injury to area:  No Relieved by:  None tried Worsened by:  Movement Ineffective treatments:  None tried Associated symptoms: no fever and no neck pain    [name] is a 27 y.o. female who presents to the ED with an injury to the right hand at the base of the thumb. She was at work and holding a needle nose pliers when a coworker took then and when she it the pliers cut the patient's hand. Minimal bleeding to the area.   Past Medical History  Diagnosis Date  . Adenomyosis 2012    Planning hysterectomy   . 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 Asthma 1988  . Thyromegaly     H/O  . ADHD (attention deficit hyperactivity disorder)     NO MEDS  . 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 Incise and drain abcess      surgical incision, wound produced mrsa  . Endometrial ablation  2011  . Tubal ligation  2010   Family History  Problem Relation Age of Onset  . Depression Sister   . Depression Mother   . Diabetes Father   . Alzheimer's disease Maternal Grandfather   . Ovarian cancer      maternal great grandmother  . Uterine cancer      maternal great aunt  . Testicular cancer Cousin     maternal cousin   History  Substance Use Topics  .  Smoking status: Current Every Day Smoker -- 0.50 packs/day    Types: Cigarettes  . Smokeless tobacco: Never Used  . Alcohol Use: 0.6 oz/week    1 Cans of beer per week     Comment: ocassionally   OB History   Grav Para Term Preterm Abortions TAB SAB Ect Mult Living   3 3 3       3      Review of Systems  Constitutional: Negative for fever and chills.  HENT: Negative for neck pain.   Respiratory: Negative for shortness of breath.   Gastrointestinal: Negative for nausea and vomiting.  Musculoskeletal:       Laceration right hand  Skin: Positive for wound.  Allergic/Immunologic: Negative for immunocompromised state.  Neurological: Negative for headaches.  Psychiatric/Behavioral: The patient is not nervous/anxious.     Allergies  Penicillins  Home Medications   Current Outpatient Rx  Name  Route  Sig  Dispense  Refill  . Albuterol (PROVENTIL IN)   Inhalation   Inhale into the lungs. Inhale every 6 hours as needed         . ibuprofen (ADVIL,MOTRIN) 800 MG tablet   Oral   Take 1 tablet (800 mg total) by  mouth every 8 (eight) hours as needed for pain.   30 tablet   5   . valACYclovir (VALTREX) 500 MG tablet   Oral   Take 1 tablet (500 mg total) by mouth daily.   30 tablet   11    BP 130/76  Pulse 97  Temp(Src) 98.4 F (36.9 C) (Oral)  Resp 18  SpO2 99% Physical Exam  Nursing note and vitals reviewed. Constitutional: She is oriented to person, place, and time. She appears well-developed and well-nourished. No distress.  HENT:  Head: Atraumatic.  Eyes: EOM are normal.  Neck: Neck supple.  Cardiovascular: Normal rate.   Pulmonary/Chest: Effort normal.  Musculoskeletal:       Right hand: She exhibits tenderness and laceration. She exhibits normal range of motion, no bony tenderness, normal capillary refill, no deformity and no swelling. Normal sensation noted. Normal strength noted.       Hands: There is a puncture laceration to the right hand palmar aspect.    Neurological: She is alert and oriented to person, place, and time. No cranial nerve deficit.  Skin: Skin is warm and dry.  Psychiatric: She has a normal mood and affect. Her behavior is normal.    ED Course  Procedures  MDM  27 y.o. female with superficial puncture/ laceration approximately 1.5 cm to the right hand palmar aspect at the base of the thumb. No sutures required. Cleaned with NSS and peroxide. Bacitracin ointment and dressing applied. Tetanus booster given. She will follow up with her PCP or return here as needed. She will take tylenol or ibuprofen as needed for pain. Will start antibiotics. Discussed with the patient and all questioned fully answered. She will return if any problems arise.     Medication List    TAKE these medications       sulfamethoxazole-trimethoprim 800-160 MG per tablet  Commonly known as:  SEPTRA DS  Take 1 tablet by mouth every 12 (twelve) hours.      ASK your doctor about these medications       ibuprofen 800 MG tablet  Commonly known as:  ADVIL,MOTRIN  Take 1 tablet (800 mg total) by mouth every 8 (eight) hours as needed for pain.     PROVENTIL IN  Inhale into the lungs. Inhale every 6 hours as needed     valACYclovir 500 MG tablet  Commonly known as:  VALTREX  Take 1 tablet (500 mg total) by mouth daily.         [facility] Orlene Och, NP 03/12/13 1447

## 2013-04-20 ENCOUNTER — Inpatient Hospital Stay (EMERGENCY_DEPARTMENT_HOSPITAL)
Admission: AD | Admit: 2013-04-20 | Discharge: 2013-04-21 | Disposition: A | Discharge status: Home / Self Care | Payer: Medicaid Other | Source: Ambulatory Visit | Attending: Obstetrics & Gynecology | Admitting: Obstetrics & Gynecology

## 2013-04-20 ENCOUNTER — Encounter (HOSPITAL_COMMUNITY): Payer: Self-pay

## 2013-04-20 DIAGNOSIS — Z880 Allergy status to penicillin: Secondary | ICD-10-CM | POA: Insufficient documentation

## 2013-04-20 DIAGNOSIS — Z8639 Personal history of other endocrine, nutritional and metabolic disease: Secondary | ICD-10-CM | POA: Insufficient documentation

## 2013-04-20 DIAGNOSIS — N12 Tubulo-interstitial nephritis, not specified as acute or chronic: Secondary | ICD-10-CM | POA: Insufficient documentation

## 2013-04-20 DIAGNOSIS — Z8619 Personal history of other infectious and parasitic diseases: Secondary | ICD-10-CM | POA: Insufficient documentation

## 2013-04-20 DIAGNOSIS — Z792 Long term (current) use of antibiotics: Secondary | ICD-10-CM | POA: Insufficient documentation

## 2013-04-20 DIAGNOSIS — R21 Rash and other nonspecific skin eruption: Secondary | ICD-10-CM | POA: Insufficient documentation

## 2013-04-20 DIAGNOSIS — Z8659 Personal history of other mental and behavioral disorders: Secondary | ICD-10-CM | POA: Insufficient documentation

## 2013-04-20 DIAGNOSIS — N10 Acute tubulo-interstitial nephritis: Secondary | ICD-10-CM

## 2013-04-20 DIAGNOSIS — Z79899 Other long term (current) drug therapy: Secondary | ICD-10-CM | POA: Insufficient documentation

## 2013-04-20 DIAGNOSIS — J45909 Unspecified asthma, uncomplicated: Secondary | ICD-10-CM | POA: Insufficient documentation

## 2013-04-20 DIAGNOSIS — Z862 Personal history of diseases of the blood and blood-forming organs and certain disorders involving the immune mechanism: Secondary | ICD-10-CM | POA: Insufficient documentation

## 2013-04-20 DIAGNOSIS — F17200 Nicotine dependence, unspecified, uncomplicated: Secondary | ICD-10-CM | POA: Insufficient documentation

## 2013-04-20 LAB — CBC WITH DIFFERENTIAL/PLATELET
Basophils Absolute: 0 K/uL (ref 0.0–0.1)
Basophils Relative: 0 % (ref 0–1)
Eosinophils Absolute: 0 K/uL (ref 0.0–0.7)
Eosinophils Relative: 0 % (ref 0–5)
HCT: 38.1 % (ref 36.0–46.0)
Hemoglobin: 13.4 g/dL (ref 12.0–15.0)
Lymphocytes Relative: 7 % — ABNORMAL LOW (ref 12–46)
Lymphs Abs: 1.1 K/uL (ref 0.7–4.0)
MCH: 31.2 pg (ref 26.0–34.0)
MCHC: 35.2 g/dL (ref 30.0–36.0)
MCV: 88.6 fL (ref 78.0–100.0)
Monocytes Absolute: 1 K/uL (ref 0.1–1.0)
Monocytes Relative: 6 % (ref 3–12)
Neutro Abs: 13.5 K/uL — ABNORMAL HIGH (ref 1.7–7.7)
Neutrophils Relative %: 87 % — ABNORMAL HIGH (ref 43–77)
Platelets: 286 K/uL (ref 150–400)
RBC: 4.3 MIL/uL (ref 3.87–5.11)
RDW: 13.3 % (ref 11.5–15.5)
WBC: 15.5 K/uL — ABNORMAL HIGH (ref 4.0–10.5)

## 2013-04-20 LAB — URINALYSIS, ROUTINE W REFLEX MICROSCOPIC
Bilirubin Urine: NEGATIVE
Glucose, UA: NEGATIVE mg/dL
Ketones, ur: 15 mg/dL — AB
Nitrite: POSITIVE — AB
Protein, ur: 30 mg/dL — AB
Specific Gravity, Urine: 1.015 (ref 1.005–1.030)
Urobilinogen, UA: 1 mg/dL (ref 0.0–1.0)
pH: 6.5 (ref 5.0–8.0)

## 2013-04-20 LAB — URINE MICROSCOPIC-ADD ON

## 2013-04-20 LAB — POCT PREGNANCY, URINE: Preg Test, Ur: NEGATIVE

## 2013-04-20 MED ORDER — IBUPROFEN 600 MG PO TABS
600.0000 mg | ORAL_TABLET | ORAL | Status: AC
  Administered 2013-04-20: 600 mg via ORAL
  Filled 2013-04-20: qty 1

## 2013-04-20 NOTE — MAU Note (Signed)
Pt having weakness, chills, urinary urgency.

## 2013-04-21 ENCOUNTER — Emergency Department (HOSPITAL_COMMUNITY)
Admission: EM | Admit: 2013-04-21 | Discharge: 2013-04-22 | Disposition: A | Discharge status: Home / Self Care | Payer: Medicaid Other | Attending: Emergency Medicine | Admitting: Emergency Medicine

## 2013-04-21 ENCOUNTER — Encounter (HOSPITAL_COMMUNITY): Payer: Self-pay | Admitting: Emergency Medicine

## 2013-04-21 DIAGNOSIS — N10 Acute tubulo-interstitial nephritis: Secondary | ICD-10-CM

## 2013-04-21 DIAGNOSIS — N12 Tubulo-interstitial nephritis, not specified as acute or chronic: Secondary | ICD-10-CM

## 2013-04-21 LAB — BASIC METABOLIC PANEL
CO2: 25 mEq/L (ref 19–32)
Calcium: 9.2 mg/dL (ref 8.4–10.5)
Chloride: 100 mEq/L (ref 96–112)
Creatinine, Ser: 1.08 mg/dL (ref 0.50–1.10)
GFR calc Af Amer: 81 mL/min — ABNORMAL LOW (ref >90)
GFR calc non Af Amer: 70 mL/min — ABNORMAL LOW (ref >90)
Glucose, Bld: 117 mg/dL — ABNORMAL HIGH (ref 70–99)
Potassium: 3.1 mEq/L — ABNORMAL LOW (ref 3.5–5.1)
Sodium: 137 mEq/L (ref 135–145)

## 2013-04-21 MED ORDER — ONDANSETRON 4 MG PO TBDP: 4.0000 mg | ORAL_TABLET | Freq: Four times a day (QID) | ORAL | Status: DC | PRN

## 2013-04-21 MED ORDER — CIPROFLOXACIN HCL 500 MG PO TABS: 500.0000 mg | ORAL_TABLET | Freq: Two times a day (BID) | ORAL | Status: DC

## 2013-04-21 MED ORDER — CEPHALEXIN 500 MG PO CAPS: 500.0000 mg | ORAL_CAPSULE | Freq: Four times a day (QID) | ORAL | Status: AC

## 2013-04-21 MED ORDER — HYDROMORPHONE HCL PF 1 MG/ML IJ SOLN
1.0000 mg | Freq: Once | INTRAMUSCULAR | Status: AC
  Administered 2013-04-21: 1 mg via INTRAVENOUS
  Filled 2013-04-21: qty 1

## 2013-04-21 MED ORDER — DIPHENHYDRAMINE HCL 25 MG PO CAPS
25.0000 mg | ORAL_CAPSULE | Freq: Once | ORAL | Status: AC
  Administered 2013-04-21: 25 mg via ORAL
  Filled 2013-04-21: qty 1

## 2013-04-21 MED ORDER — HYDROCODONE-ACETAMINOPHEN 5-325 MG PO TABS: 1.0000 | ORAL_TABLET | Freq: Four times a day (QID) | ORAL | Status: DC | PRN

## 2013-04-21 MED ORDER — ONDANSETRON 8 MG PO TBDP
8.0000 mg | ORAL_TABLET | ORAL | Status: AC
  Administered 2013-04-21: 8 mg via ORAL
  Filled 2013-04-21: qty 1

## 2013-04-21 MED ORDER — DEXTROSE 5 % IV SOLN
1.0000 g | Freq: Once | INTRAVENOUS | Status: AC
  Administered 2013-04-21: 1 g via INTRAVENOUS
  Filled 2013-04-21: qty 10

## 2013-04-21 MED ORDER — SODIUM CHLORIDE 0.9 % IV BOLUS (SEPSIS)
1000.0000 mL | Freq: Once | INTRAVENOUS | Status: AC
  Administered 2013-04-21: 1000 mL via INTRAVENOUS

## 2013-04-21 MED ORDER — OXYCODONE-ACETAMINOPHEN 5-325 MG PO TABS
2.0000 | ORAL_TABLET | ORAL | Status: AC
  Administered 2013-04-21: 2 via ORAL
  Filled 2013-04-21: qty 2

## 2013-04-21 MED ORDER — CIPROFLOXACIN HCL 500 MG PO TABS
500.0000 mg | ORAL_TABLET | ORAL | Status: AC
  Administered 2013-04-21: 500 mg via ORAL
  Filled 2013-04-21: qty 1

## 2013-04-21 NOTE — ED Notes (Signed)
Pt reports was at hospital yesterday, dx of kidney infection. Pt told that if her fever came back and the pain got worse she should come back to the hospital. Pt has pain to R flank. Pt denies any urinary sx. Pt took first dose abx yesterday. Pt states fever at home was 103. She took ibuprofen at 1700, and fever went down, but then it came back up.

## 2013-04-21 NOTE — ED Provider Notes (Addendum)
CSN: [id number]     Arrival date & time 04/21/13  2153 History   First MD Initiated Contact with Patient 04/21/13 2159     Chief Complaint  Patient presents with  . Pyelonephritis   (Consider location/radiation/quality/duration/timing/severity/associated sxs/prior Treatment) HPI Patient presents with concerns of ongoing right flank pain, polyuria. [facility]  She diagnosed with pyelonephritis.  She was instructed to return to the emergency department should she is continued pain, or any new changes in her condition. She denies new fever, vomiting, diarrhea, dysuria. She has history of endometrial ablation, has no menstrual periods. She states that throughout the day she has had the same pain in her right flank as yesterday. She hasn't taken 2 doses of ciprofloxacin, and subsequently developed an urticarial rash in both upper extremities.    Past Medical History  Diagnosis Date  . Adenomyosis 2012    Planning hysterectomy   . 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 Asthma 1988  . Thyromegaly     H/O  . ADHD (attention deficit hyperactivity disorder)     NO MEDS  . 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 Incise and drain abcess      surgical incision, wound produced mrsa  . Endometrial ablation  2011  . Tubal ligation  2010   Family History  Problem Relation Age of Onset  . Depression Sister   . Depression Mother   . Diabetes Father   . Alzheimer's disease Maternal Grandfather   . Ovarian cancer      maternal great grandmother  . Uterine cancer      maternal great aunt  . Testicular cancer Cousin     maternal cousin   History  Substance Use Topics  . Smoking status: Current Every Day Smoker -- 0.50 packs/day    Types: Cigarettes  . Smokeless tobacco: Never Used  . Alcohol Use: 0.6 oz/week    1 Cans of beer per week     Comment: ocassionally   OB History   Grav Para Term Preterm  Abortions TAB SAB Ect Mult Living   3 3 3       3      Review of Systems  Constitutional:       Per HPI, otherwise negative  HENT:       Per HPI, otherwise negative  Respiratory:       Per HPI, otherwise negative  Cardiovascular:       Per HPI, otherwise negative  Gastrointestinal: Negative for vomiting.  Endocrine:       Negative aside from HPI  Genitourinary:       Neg aside from HPI   Musculoskeletal:       Per HPI, otherwise negative  Skin: Positive for rash.  Neurological: Negative for syncope.    Allergies  Ciprofloxacin and Penicillins  Home Medications   Current Outpatient Rx  Name  Route  Sig  Dispense  Refill  . albuterol (PROVENTIL HFA;VENTOLIN HFA) 108 (90 BASE) MCG/ACT inhaler   Inhalation   Inhale 1 puff into the lungs every 6 (six) hours as needed for wheezing or shortness of breath.         . ciprofloxacin (CIPRO) 500 MG tablet   Oral   Take 1 tablet (500 mg total) by mouth 2 (two) times daily.   28 tablet   0   . ibuprofen (ADVIL,MOTRIN) 800  MG tablet   Oral   Take 1 tablet (800 mg total) by mouth every 8 (eight) hours as needed for pain.   30 tablet   5   . valACYclovir (VALTREX) 500 MG tablet   Oral   Take 1 tablet (500 mg total) by mouth daily.   30 tablet   11   . HYDROCORTISONE, TOPICAL, 10 % GEL   Apply externally   Apply 1 application topically.         . ondansetron (ZOFRAN ODT) 4 MG disintegrating tablet   Oral   Take 1 tablet (4 mg total) by mouth every 6 (six) hours as needed for nausea.   20 tablet   0    BP 98/69  Pulse 103  Temp(Src) 99.5 F (37.5 C) (Oral)  Resp 16  Ht 5' 3.5" (1.613 m)  Wt 192 lb (87.091 kg)  BMI 33.47 kg/m2  SpO2 98% Physical Exam  Nursing note and vitals reviewed. Constitutional: She is oriented to person, place, and time. She appears well-developed and well-nourished. No distress.  HENT:  Head: Normocephalic and atraumatic.  Eyes: Conjunctivae and EOM are normal.  Cardiovascular:  Normal rate and regular rhythm.   Pulmonary/Chest: Effort normal and breath sounds normal. No stridor. No respiratory distress.  Abdominal: Soft. Normal appearance. She exhibits no distension. There is tenderness. There is CVA tenderness. There is no rigidity and no guarding.  Musculoskeletal: She exhibits no edema.  Neurological: She is alert and oriented to person, place, and time. No cranial nerve deficit.  Skin: Skin is warm and dry.  Psychiatric: She has a normal mood and affect.    ED Course  Procedures (including critical care time) Labs Review Labs Reviewed  BASIC METABOLIC PANEL - Abnormal; Notable for the following:    Potassium 3.1 (*)    Glucose, Bld 117 (*)    BUN 5 (*)    GFR calc non Af Amer 70 (*)    GFR calc Af Amer 81 (*)    All other components within normal limits  URINALYSIS, ROUTINE W REFLEX MICROSCOPIC   Imaging Review No results found.  EKG Interpretation   None      I reviewed the patient's chart, including diagnosis of pyelonephritis, grossly infected urine from yesterday.  12:00 AM On re-exam the patient is sleeping MDM  No diagnosis found. This patient presents one day after initial diagnosis found for this, with continued pain, but no fever, no new nausea, vomiting, evidence of systemic infection. With the patient's rash, she was changed to new antibiotics, ceftriaxone, received IV fluids.  Patient's evaluation here demonstrated no new renal dysfunction.  With her recent diagnosis of bronchitis, she was appropriate for discharge after hydration, initiation of antibiotics    Gerhard Munch, MD 04/21/13 1610  Gerhard Munch, MD 04/22/13 0000

## 2013-04-22 LAB — URINE CULTURE: Culture: >=100000

## 2013-04-22 NOTE — ED Notes (Signed)
Antibiotics restarted not ready for discharge yet.

## 2014-02-27 ENCOUNTER — Encounter (HOSPITAL_COMMUNITY): Payer: Self-pay | Admitting: Emergency Medicine

## 2014-02-27 ENCOUNTER — Emergency Department (HOSPITAL_COMMUNITY): Payer: Medicaid Other

## 2014-02-27 ENCOUNTER — Emergency Department (HOSPITAL_COMMUNITY)
Admission: EM | Admit: 2014-02-27 | Discharge: 2014-02-27 | Disposition: A | Discharge status: Home / Self Care | Payer: Medicaid Other | Attending: Emergency Medicine | Admitting: Emergency Medicine

## 2014-02-27 DIAGNOSIS — Z880 Allergy status to penicillin: Secondary | ICD-10-CM | POA: Insufficient documentation

## 2014-02-27 DIAGNOSIS — J45909 Unspecified asthma, uncomplicated: Secondary | ICD-10-CM

## 2014-02-27 DIAGNOSIS — Z8659 Personal history of other mental and behavioral disorders: Secondary | ICD-10-CM | POA: Diagnosis not present

## 2014-02-27 DIAGNOSIS — Z8619 Personal history of other infectious and parasitic diseases: Secondary | ICD-10-CM | POA: Insufficient documentation

## 2014-02-27 DIAGNOSIS — R42 Dizziness and giddiness: Secondary | ICD-10-CM | POA: Diagnosis not present

## 2014-02-27 DIAGNOSIS — R110 Nausea: Secondary | ICD-10-CM | POA: Diagnosis not present

## 2014-02-27 DIAGNOSIS — Z8742 Personal history of other diseases of the female genital tract: Secondary | ICD-10-CM | POA: Diagnosis not present

## 2014-02-27 DIAGNOSIS — J209 Acute bronchitis, unspecified: Secondary | ICD-10-CM

## 2014-02-27 DIAGNOSIS — J45901 Unspecified asthma with (acute) exacerbation: Secondary | ICD-10-CM | POA: Insufficient documentation

## 2014-02-27 DIAGNOSIS — Z79899 Other long term (current) drug therapy: Secondary | ICD-10-CM | POA: Diagnosis not present

## 2014-02-27 DIAGNOSIS — R0602 Shortness of breath: Secondary | ICD-10-CM | POA: Diagnosis present

## 2014-02-27 DIAGNOSIS — F17200 Nicotine dependence, unspecified, uncomplicated: Secondary | ICD-10-CM | POA: Diagnosis not present

## 2014-02-27 LAB — BASIC METABOLIC PANEL
Anion gap: 11 (ref 5–15)
BUN: 6 mg/dL (ref 6–23)
CO2: 23 mEq/L (ref 19–32)
Calcium: 9.3 mg/dL (ref 8.4–10.5)
Chloride: 104 mEq/L (ref 96–112)
Creatinine, Ser: 0.82 mg/dL (ref 0.50–1.10)
GFR calc Af Amer: >90 mL/min (ref >90)
GLUCOSE: 89 mg/dL (ref 70–99)
Potassium: 3.9 mEq/L (ref 3.7–5.3)
Sodium: 138 mEq/L (ref 137–147)

## 2014-02-27 LAB — URINALYSIS, ROUTINE W REFLEX MICROSCOPIC
Bilirubin Urine: NEGATIVE
GLUCOSE, UA: NEGATIVE mg/dL
Hgb urine dipstick: NEGATIVE
Ketones, ur: NEGATIVE mg/dL
LEUKOCYTES UA: NEGATIVE
Nitrite: POSITIVE — AB
PH: 8 (ref 5.0–8.0)
Protein, ur: NEGATIVE mg/dL
Specific Gravity, Urine: 1.021 (ref 1.005–1.030)
Urobilinogen, UA: 1 mg/dL (ref 0.0–1.0)

## 2014-02-27 LAB — URINE MICROSCOPIC-ADD ON

## 2014-02-27 LAB — CBC
HEMATOCRIT: 40.3 % (ref 36.0–46.0)
Hemoglobin: 13.7 g/dL (ref 12.0–15.0)
MCH: 32.4 pg (ref 26.0–34.0)
MCHC: 34 g/dL (ref 30.0–36.0)
MCV: 95.3 fL (ref 78.0–100.0)
Platelets: 308 10*3/uL (ref 150–400)
RBC: 4.23 MIL/uL (ref 3.87–5.11)
RDW: 12.9 % (ref 11.5–15.5)
WBC: 7.2 10*3/uL (ref 4.0–10.5)

## 2014-02-27 LAB — PRO B NATRIURETIC PEPTIDE: Pro B Natriuretic peptide (BNP): 19.3 pg/mL (ref 0–125)

## 2014-02-27 MED ORDER — ALBUTEROL SULFATE HFA 108 (90 BASE) MCG/ACT IN AERS
2.0000 | INHALATION_SPRAY | Freq: Once | RESPIRATORY_TRACT | Status: AC
  Administered 2014-02-27: 2 via RESPIRATORY_TRACT
  Filled 2014-02-27: qty 6.7

## 2014-02-27 MED ORDER — SULFAMETHOXAZOLE-TRIMETHOPRIM 800-160 MG PO TABS: 1.0000 | ORAL_TABLET | Freq: Two times a day (BID) | ORAL | Status: AC

## 2014-02-27 MED ORDER — PREDNISONE (PAK) 10 MG PO TABS: ORAL_TABLET | Freq: Every day | ORAL | Status: DC

## 2014-02-27 MED ORDER — ALBUTEROL SULFATE (2.5 MG/3ML) 0.083% IN NEBU: 5.0000 mg | INHALATION_SOLUTION | Freq: Once | RESPIRATORY_TRACT | Status: DC

## 2014-02-27 MED ORDER — IPRATROPIUM BROMIDE 0.02 % IN SOLN: 0.5000 mg | Freq: Once | RESPIRATORY_TRACT | Status: DC

## 2014-02-27 NOTE — ED Notes (Signed)
Pt c/o dizziness, SOB and asthma attacks since x 2 days. Has inhaler but it is expired(2013) but has been using it. Pt also c/o nausea.

## 2014-02-27 NOTE — Discharge Instructions (Signed)
Read the information below.  Use the prescribed medication as directed.  Please discuss all new medications with your pharmacist.  You may return to the Emergency Department at any time for worsening condition or any new symptoms that concern you.  If there is any possibility that you might be pregnant, please let your health care provider know and discuss this with the pharmacist to ensure medication safety.  If you develop worsening shortness of breath, uncontrolled wheezing, severe chest pain, or fevers despite using tylenol and/or ibuprofen, return for a recheck.        Asthma Asthma is a condition of the lungs in which the airways tighten and narrow. Asthma can make it hard to breathe. Asthma cannot be cured, but medicine and lifestyle changes can help control it. Asthma may be started (triggered) by:  Animal skin flakes (dander).  Dust.  Cockroaches.  Pollen.  Mold.  Smoke.  Cleaning products.  Hair sprays or aerosol sprays.  Paint fumes or strong smells.  Cold air, weather changes, and winds.  Crying or laughing hard.  Stress.  Certain medicines or drugs.  Foods, such as dried fruit, potato chips, and sparkling grape juice.  Infections or conditions (colds, flu).  Exercise.  Certain medical conditions or diseases.  Exercise or tiring activities. HOME CARE   Take medicine as told by your doctor.  Use a peak flow meter as told by your doctor. A peak flow meter is a tool that measures how well the lungs are working.  Record and keep track of the peak flow meter's readings.  Understand and use the asthma action plan. An asthma action plan is a written plan for taking care of your asthma and treating your attacks.  To help prevent asthma attacks:  Do not smoke. Stay away from secondhand smoke.  Change your heating and air conditioning filter often.  Limit your use of fireplaces and wood stoves.  Get rid of pests (such as roaches and mice) and their  droppings.  Throw away plants if you see mold on them.  Clean your floors. Dust regularly. Use cleaning products that do not smell.  Have someone vacuum when you are not home. Use a vacuum cleaner with a HEPA filter if possible.  Replace carpet with wood, tile, or vinyl flooring. Carpet can trap animal skin flakes and dust.  Use allergy-proof pillows, mattress covers, and box spring covers.  Wash bed sheets and blankets every week in hot water and dry them in a dryer.  Use blankets that are made of polyester or cotton.  Clean bathrooms and kitchens with bleach. If possible, have someone repaint the walls in these rooms with mold-resistant paint. Keep out of the rooms that are being cleaned and painted.  Wash hands often. GET HELP IF:  You have make a whistling sound when breaking (wheeze), have shortness of breath, or have a cough even if taking medicine to prevent attacks.  The colored mucus you cough up (sputum) is thicker than usual.  The colored mucus you cough up changes from clear or white to yellow, green, gray, or bloody.  You have problems from the medicine you are taking such as:  A rash.  Itching.  Swelling.  Trouble breathing.  You need reliever medicines more than 2-3 times a week.  Your peak flow measurement is still at 50-79% of your personal best after following the action plan for 1 hour.  You have a fever. GET HELP RIGHT AWAY IF:   You seem to  be worse and are not responding to medicine during an asthma attack.  You are short of breath even at rest.  You get short of breath when doing very little activity.  You have trouble eating, drinking, or talking.  You have chest pain.  You have a fast heartbeat.  Your lips or fingernails start to turn blue.  You are light-headed, dizzy, or faint.  Your peak flow is less than 50% of your personal best. MAKE SURE YOU:   Understand these instructions.  Will watch your condition.  Will get help  right away if you are not doing well or get worse. Document Released: 11/12/2007 Document Revised: 10/10/2013 Document Reviewed: 12/23/2012 [facility] Patient Information 2015 [place], [place]. This information is not intended to replace advice given to you by your health care provider. Make sure you discuss any questions you have with your health care provider.

## 2014-02-27 NOTE — ED Provider Notes (Signed)
CSN: [id number]     Arrival date & time 02/27/14  1258 History   First MD Initiated Contact with Patient 02/27/14 1918     Chief Complaint  Patient presents with  . Nausea  . Asthma  . Shortness of Breath  . Dizziness     (Consider location/radiation/quality/duration/timing/severity/associated sxs/prior Treatment) The history is provided by the patient.    Pt with hx asthma p/w asthma attacks x 2 that began last night.  Has had URI 2.5 weeks ago that is mostly resolved but with residual cough.  Cough was productive of yellow sputum but is now dry.  Overnight she had tightness and pressure on her chest, SOB, cough, palpitations that resolved with expired albuterol inhaler and coffee.  Attacks occurred at 2am and 10am.  She is currently feeling much better, with only mild tightness in her chest and SOB.  Has had slight dizziness intermittently x 2 days, described as "feeling high." Denies fever, sore throat, leg swelling, recent immobilization, exogenous estrogen.  Pt does smoke.   Past Medical History  Diagnosis Date  . Adenomyosis 2012    Planning hysterectomy   . 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 Asthma 1988  . Thyromegaly     H/O  . ADHD (attention deficit hyperactivity disorder)     NO MEDS  . 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 Incise and drain abcess      surgical incision, wound produced mrsa  . Endometrial ablation  2011  . Tubal ligation  2010   Family History  Problem Relation Age of Onset  . Depression Sister   . Depression Mother   . Diabetes Father   . Alzheimer's disease Maternal Grandfather   . Ovarian cancer      maternal great grandmother  . Uterine cancer      maternal great aunt  . Testicular cancer Cousin     maternal cousin   History  Substance Use Topics  . Smoking status: Current Every Day Smoker -- 0.50 packs/day    Types: Cigarettes  . Smokeless tobacco: Never Used  . Alcohol Use: 0.6 oz/week     1 Cans of beer per week     Comment: ocassionally   OB History   Grav Para Term Preterm Abortions TAB SAB Ect Mult Living   Review of Systems  Constitutional: Negative for fever.  HENT: Negative for congestion, sore throat and trouble swallowing.   Respiratory: Negative for cough and shortness of breath.   Cardiovascular: Negative for chest pain.  Gastrointestinal: Negative for nausea, vomiting, abdominal pain and diarrhea.  Genitourinary: Negative for dysuria, urgency, frequency, vaginal bleeding, vaginal discharge and menstrual problem (hx endometrial ablation).  Neurological: Positive for dizziness.  All other systems reviewed and are negative.     Allergies  Ciprofloxacin and Penicillins  Home Medications   Prior to Admission medications   Medication Sig Start Date End Date Taking? Authorizing Provider  albuterol (PROVENTIL HFA;VENTOLIN HFA) 108 (90 BASE) MCG/ACT inhaler Inhale 1 puff into the lungs every 6 (six) hours as needed for wheezing or shortness of breath.    Historical Provider, MD  HYDROcodone-acetaminophen (NORCO/VICODIN) 5-325 MG per tablet Take 1 tablet by mouth every 6 (six) hours as needed. 04/21/13   Gerhard Munch, MD  HYDROCORTISONE, TOPICAL, 10 % GEL Apply 1 application topically.  Historical Provider, MD  ibuprofen (ADVIL,MOTRIN) 800 MG tablet Take 1 tablet (800 mg total) by mouth every 8 (eight) hours as needed for pain. 03/03/13   Brock Bad, MD  ondansetron (ZOFRAN ODT) 4 MG disintegrating tablet Take 1 tablet (4 mg total) by mouth every 6 (six) hours as needed for nausea. 04/21/13   Wilmer Floor Leftwich-Kirby, CNM  valACYclovir (VALTREX) 500 MG tablet Take 1 tablet (500 mg total) by mouth daily. 05/18/12   Purcell Nails, MD   BP 99/59  Pulse 69  Temp(Src) 98.6 F (37 C) (Oral)  Resp 20  SpO2 100% Physical Exam  Nursing note and vitals reviewed. Constitutional: She appears well-developed and well-nourished. No  distress.  HENT:  Head: Normocephalic and atraumatic.  Neck: Neck supple.  Cardiovascular: Normal rate and regular rhythm.   Pulmonary/Chest: Effort normal and breath sounds normal. No respiratory distress. She has no wheezes. She has no rales.  Abdominal: Soft. She exhibits no distension. There is no tenderness. There is no rebound and no guarding.  Neurological: She is alert.  Skin: She is not diaphoretic.    ED Course  Procedures (including critical care time) Labs Review Labs Reviewed  URINALYSIS, ROUTINE W REFLEX MICROSCOPIC - Abnormal; Notable for the following:    APPearance CLOUDY (*)    Nitrite POSITIVE (*)    All other components within normal limits  URINE MICROSCOPIC-ADD ON - Abnormal; Notable for the following:    Bacteria, UA MANY (*)    All other components within normal limits  CBC  BASIC METABOLIC PANEL  PRO B NATRIURETIC PEPTIDE    Imaging Review Dg Chest 2 View  02/27/2014   CLINICAL DATA:  Shortness of breath. Dizziness. Smoker. History of asthma.  EXAM: CHEST  2 VIEW  COMPARISON:  11/04/2007.  FINDINGS: Normal sized heart. Clear lungs. Stable mild diffuse peribronchial thickening. Minimal scoliosis.  IMPRESSION: No acute abnormality.  Stable mild chronic bronchitic changes.   Electronically Signed   By: Gordan Payment M.D.   On: 02/27/2014 14:06     EKG Interpretation None      MDM   Final diagnoses:  Bronchitis with asthma, acute    Afebrile, nontoxic patient with URI with residual cough and asthma attacks overnight.  Found to have UTI on UA, pt's only possible symptom is mild dizziness.   D/C home with albuterol HFA (lungs CTAB, pt declined nebs), prednisone, bactrim.   Discussed result, findings, treatment, and follow up  with patient.  Pt given return precautions.  Pt verbalizes understanding and agrees with plan.         [place], PA-C 02/28/14 [phone]

## 2014-03-01 NOTE — ED Provider Notes (Signed)
Medical screening examination/treatment/procedure(s) were performed by non-physician practitioner and as supervising physician I was immediately available for consultation/collaboration.   EKG Interpretation None        Niyati Heinke, DO 03/01/14 1741 

## 2014-04-10 ENCOUNTER — Encounter (HOSPITAL_COMMUNITY): Payer: Self-pay | Admitting: Emergency Medicine

## 2014-04-28 ENCOUNTER — Ambulatory Visit (INDEPENDENT_AMBULATORY_CARE_PROVIDER_SITE_OTHER): Payer: Medicaid Other | Admitting: *Deleted

## 2014-04-28 DIAGNOSIS — Z111 Encounter for screening for respiratory tuberculosis: Secondary | ICD-10-CM

## 2014-04-28 NOTE — Progress Notes (Signed)
Tuberculin skin test applied to left ventral forearm. Explained how to read the test, measuring induration not just erythema; she will come into office in 48-72 hours to have test read. 

## 2014-05-01 ENCOUNTER — Ambulatory Visit (INDEPENDENT_AMBULATORY_CARE_PROVIDER_SITE_OTHER): Payer: Medicaid Other | Admitting: *Deleted

## 2014-05-01 DIAGNOSIS — Z111 Encounter for screening for respiratory tuberculosis: Secondary | ICD-10-CM

## 2014-05-01 LAB — TB SKIN TEST
INDURATION: 0 mm
TB Skin Test: NEGATIVE

## 2014-05-01 NOTE — Progress Notes (Signed)
Pt here today for a PPD read placed 04/28/14 @ 2:20pm.  Pt within 48-72 hours time frame.  PPD was negative or induration and erythema. Printed letter or pts work.  [name], Maryjo RochesterJessica Dawn

## 2014-07-20 ENCOUNTER — Encounter (HOSPITAL_COMMUNITY): Payer: Self-pay

## 2014-07-20 ENCOUNTER — Emergency Department (HOSPITAL_COMMUNITY)
Admission: EM | Admit: 2014-07-20 | Discharge: 2014-07-20 | Disposition: A | Discharge status: Home / Self Care | Payer: Medicaid Other | Attending: Emergency Medicine | Admitting: Emergency Medicine

## 2014-07-20 DIAGNOSIS — Z720 Tobacco use: Secondary | ICD-10-CM | POA: Diagnosis not present

## 2014-07-20 DIAGNOSIS — Z8742 Personal history of other diseases of the female genital tract: Secondary | ICD-10-CM | POA: Insufficient documentation

## 2014-07-20 DIAGNOSIS — H9209 Otalgia, unspecified ear: Secondary | ICD-10-CM | POA: Insufficient documentation

## 2014-07-20 DIAGNOSIS — Z8639 Personal history of other endocrine, nutritional and metabolic disease: Secondary | ICD-10-CM | POA: Diagnosis not present

## 2014-07-20 DIAGNOSIS — J069 Acute upper respiratory infection, unspecified: Secondary | ICD-10-CM | POA: Diagnosis not present

## 2014-07-20 DIAGNOSIS — M549 Dorsalgia, unspecified: Secondary | ICD-10-CM | POA: Insufficient documentation

## 2014-07-20 DIAGNOSIS — J45909 Unspecified asthma, uncomplicated: Secondary | ICD-10-CM | POA: Diagnosis not present

## 2014-07-20 DIAGNOSIS — Z79899 Other long term (current) drug therapy: Secondary | ICD-10-CM | POA: Diagnosis not present

## 2014-07-20 DIAGNOSIS — Z8619 Personal history of other infectious and parasitic diseases: Secondary | ICD-10-CM | POA: Insufficient documentation

## 2014-07-20 DIAGNOSIS — Z880 Allergy status to penicillin: Secondary | ICD-10-CM | POA: Diagnosis not present

## 2014-07-20 DIAGNOSIS — R509 Fever, unspecified: Secondary | ICD-10-CM | POA: Diagnosis present

## 2014-07-20 LAB — URINALYSIS, ROUTINE W REFLEX MICROSCOPIC
Bilirubin Urine: NEGATIVE
Glucose, UA: NEGATIVE mg/dL
KETONES UR: 40 mg/dL — AB
Nitrite: POSITIVE — AB
PROTEIN: NEGATIVE mg/dL
Specific Gravity, Urine: 1.028 (ref 1.005–1.030)
UROBILINOGEN UA: 0.2 mg/dL (ref 0.0–1.0)
pH: 6 (ref 5.0–8.0)

## 2014-07-20 LAB — URINE MICROSCOPIC-ADD ON

## 2014-07-20 LAB — RAPID STREP SCREEN (MED CTR MEBANE ONLY): Streptococcus, Group A Screen (Direct): NEGATIVE

## 2014-07-20 NOTE — ED Provider Notes (Signed)
CSN: [id number]     Arrival date & time 07/20/14  1241 History   None    No chief complaint on file.    (Consider location/radiation/quality/duration/timing/severity/associated sxs/prior Treatment) HPI Comments: The patient is a 29 year old female presents emergency room chief complaint of sore throat for 6 days. Patient reports associated nasal congestion and fever. Patient reports fever resolved, MAXIMUM TEMPERATURE 1026 days ago. Patient reports taking TheraFlu and TheraFlu cough without full resolution of symptoms. She reports headache and low back pain associated with illness. She reports decreased oral intake. No known sick contacts unsure about flu vaccination. No LMP recorded. Patient has had an ablation.  The history is provided by the patient. No language interpreter was used.    Past Medical History  Diagnosis Date  . Adenomyosis 2012    Planning hysterectomy   . 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 Asthma 1988  . Thyromegaly     H/O  . ADHD (attention deficit hyperactivity disorder)     NO MEDS  . 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 Incise and drain abcess      surgical incision, wound produced mrsa  . Endometrial ablation  2011  . Tubal ligation  2010   Family History  Problem Relation Age of Onset  . Depression Sister   . Depression Mother   . Diabetes Father   . Alzheimer's disease Maternal Grandfather   . Ovarian cancer      maternal great grandmother  . Uterine cancer      maternal great aunt  . Testicular cancer Cousin     maternal cousin   History  Substance Use Topics  . Smoking status: Current Every Day Smoker -- 0.50 packs/day    Types: Cigarettes  . Smokeless tobacco: Never Used  . Alcohol Use: 0.6 oz/week    1 Cans of beer per week     Comment: ocassionally   OB History    Gravida Para Term Preterm AB TAB SAB Ectopic Multiple Living   3 3 3       3      Review of Systems  Constitutional:  Positive for fever.  HENT: Positive for congestion, ear pain, rhinorrhea and sore throat.   Respiratory: Negative for cough.   Gastrointestinal: Negative for nausea and vomiting.  Musculoskeletal: Positive for back pain.  Neurological: Positive for headaches.      Allergies  Ciprofloxacin and Penicillins  Home Medications   Prior to Admission medications   Medication Sig Start Date End Date Taking? Authorizing Provider  albuterol (PROVENTIL HFA;VENTOLIN HFA) 108 (90 BASE) MCG/ACT inhaler Inhale 1 puff into the lungs every 6 (six) hours as needed for wheezing or shortness of breath.    Historical Provider, MD  hydrocortisone cream 1 % Apply 1 application topically 2 (two) times daily as needed for itching.    Historical Provider, MD  predniSONE (STERAPRED UNI-PAK) 10 MG tablet Take by mouth daily. Day 1: take 6 tabs.  Day 2: 5 tabs  Day 3: 4 tabs  Day 4: 3 tabs  Day 5: 2 tabs  Day 6: 1 tab 02/27/14   Trixie DredgeEmily West, PA-C   BP 110/57 mmHg  Pulse 86  Temp(Src) 97.9 F (36.6 C) (Oral)  Resp 16  SpO2 100% Physical Exam  Constitutional: She is oriented to person, place, and time. She appears well-developed and well-nourished. No distress.  HENT:  Head: Normocephalic and  atraumatic.  Right Ear: Tympanic membrane and external ear normal. No middle ear effusion.  Left Ear: Tympanic membrane and external ear normal.  No middle ear effusion.  Mouth/Throat: Uvula is midline and mucous membranes are normal. No trismus in the jaw. No lacerations. Posterior oropharyngeal erythema present. No oropharyngeal exudate, posterior oropharyngeal edema or tonsillar abscesses.  Eyes: EOM are normal.  Neck: Neck supple.  Cardiovascular: Normal rate and regular rhythm.   Pulmonary/Chest: Effort normal. No respiratory distress. She has no wheezes. She has no rales. She exhibits no tenderness.  Neurological: She is alert and oriented to person, place, and time.  Skin: Skin is warm and dry. She is not  diaphoretic.  Psychiatric: She has a normal mood and affect. Her behavior is normal.  Nursing note and vitals reviewed.   ED Course  Procedures (including critical care time) Labs Review Labs Reviewed  RAPID STREP SCREEN  CULTURE, GROUP A STREP  URINALYSIS, ROUTINE W REFLEX MICROSCOPIC    Imaging Review No results found.   EKG Interpretation None      MDM   Final diagnoses:  URI (upper respiratory infection)   Patient with likely URI, negative rapid strep with no signs of pharyngitis or PTA. Discussed using Mucinex DM for likely nasal congestion and sore throat. Patient was notified about rapid strep results, awaiting UA results. Plan to give medication for headache and myalgias. Prior to ordering medication patient left the ED without notifying staff or provider. She was told to return to her room by front desk staff members, patient did not return to room for further treatment and evaluation.  Patient left ED prior to UA results, or headache/body ache treatment.   Mellody Drown, PA-C 07/20/14 1615  Mirian Mo, MD 07/21/14 [phone]

## 2014-07-20 NOTE — ED Notes (Signed)
Pt presents with 4 day h/o difficulty swallowing; pt reports headache and generalized pain.  Pt reports bilateral low back pain that radiates up her spine into her neck.  Pt denies any injury for back pain, denies dysuria but reports her urine "smells like eggs".

## 2014-07-20 NOTE — ED Notes (Signed)
Pt states she is a "carrier for strep throat" states she has had sore throat since Sunday and has not eaten or drank much since then. Also c/o headache, lower back pain, and nausea, denies vomiting or diarrhea.pt has taken theraflu, chloraseptic spray and throat lozenges with no relief.

## 2014-07-20 NOTE — ED Notes (Signed)
This RN went into pt room, pt gown on bed, pt is not in room or in surrounding area. PA notified. Pt in NAD when assessed by previous RN.

## 2014-07-23 LAB — CULTURE, GROUP A STREP

## 2014-12-16 ENCOUNTER — Other Ambulatory Visit: Payer: Self-pay | Admitting: Medical

## 2014-12-16 ENCOUNTER — Inpatient Hospital Stay (HOSPITAL_COMMUNITY)
Admission: AD | Admit: 2014-12-16 | Discharge: 2014-12-16 | Disposition: A | Discharge status: Home / Self Care | Payer: Medicaid Other | Source: Ambulatory Visit | Attending: Obstetrics | Admitting: Obstetrics

## 2014-12-16 DIAGNOSIS — N644 Mastodynia: Secondary | ICD-10-CM | POA: Diagnosis not present

## 2014-12-16 DIAGNOSIS — F17210 Nicotine dependence, cigarettes, uncomplicated: Secondary | ICD-10-CM | POA: Insufficient documentation

## 2014-12-16 DIAGNOSIS — Z3202 Encounter for pregnancy test, result negative: Secondary | ICD-10-CM | POA: Diagnosis not present

## 2014-12-16 DIAGNOSIS — N6452 Nipple discharge: Secondary | ICD-10-CM | POA: Insufficient documentation

## 2014-12-16 LAB — POCT PREGNANCY, URINE: Preg Test, Ur: NEGATIVE

## 2014-12-16 LAB — HCG, QUANTITATIVE, PREGNANCY

## 2014-12-16 NOTE — Discharge Instructions (Signed)
Breast Self-Awareness  Breast self-awareness allows you to notice a breast problem early while it is still small. Do a breast self-exam:  · Every month, 5-7 days after your period (menstrual period).  · At the same time each month if you do not have periods anymore.  Look for any:  · Difference between your breasts (size, shape, or position).  · Change in breast shape or size.  · Fluid or blood coming from your nipples.  · Changes in your nipples (dimpling, nipple movement).  ·  Change in skin color or texture (redness, scaly areas).  Feel for:  · Lumps.  · Bumps.  · Dips.  · Any other changes.  HOW TO DO A BREAST SELF-EXAM  Look at your breasts and nipples.  1. Take off all your clothes above your waist.  2. Stand in front of a mirror in a room with good lighting.  3. Put your hands on your hips and push your hands downward.  Feel your breasts.   1. Lie flat on your back or stand in the shower or tub. If you are in the shower or tub, have wet, soapy hands.  2. Place your right arm above your head.  3. Place your left hand in the right underarm area.  4. Make small circles using the pads (not the fingertips) of your 3 middle fingers. Press lightly and then with medium and firm pressure.  5. Move your fingers a little lower and make the small circles at the 3 pressures (light, medium, and firm).  6. Continue moving your fingers lower and making circles until you reach the bottom of your breast.  7. Move your fingers one finger-width towards the center of the body.  8. Continue making the circles, this time moving upward until you reach the bottom of your neck.  9. Move your fingers one finger-width towards the center of your body.  10. Make circles downward when starting at the bottom of the neck. Make circles upward when starting at the bottom of the breast. Stop when you reach the middle of the chest.  11.  Repeat these steps on the other breast.  Write down what looks and feels normal for each breast. Also write  down any changes you notice.  GET HELP RIGHT AWAY IF:  · You see any changes in your breasts or nipples.  · You see skin changes.  · You have unusual discharge from your nipples.  · You feel a new lump.  · You feel unusually thick areas.  Document Released: 11/12/2007 Document Revised: 05/12/2012 Document Reviewed: 09/10/2011  [employer]® Patient Information ©2015 [employer]. This information is not intended to replace advice given to you by your health care provider. Make sure you discuss any questions you have with your health care provider.

## 2014-12-16 NOTE — Progress Notes (Signed)
Written and verbal d/c instructions given and understanding voiced. 

## 2014-12-16 NOTE — MAU Note (Addendum)
L breast draining yellow d/c. Breast tenderness both sides but more on L. Pt states has had some n/v.

## 2014-12-16 NOTE — MAU Provider Note (Signed)
History     CSN: [id number]  Arrival date and time: 12/16/14 4098   First Provider Initiated Contact with Patient 12/16/14 0047      Chief Complaint  Patient presents with  . Breast Discharge   HPI  [name] is a 29 y.o. [phone] who presents to MAU today with complaint of breast discharge since earlier tonight. The patient states that she noted a small amount of yellow discharge with a tinge of blood from her left breast in it after her shower this evening. She states recently bilateral breast tenderness. She denies any palpable lumps. Her last pregnancy was 2010 and she has not been breast feeding recently. She had an ablation and recently started to have monthly spotting. She is also concerned about possible pregnancy since she has had N/V recently. She denies fever.   OB History    Gravida Para Term Preterm AB TAB SAB Ectopic Multiple Living   Past Medical History  Diagnosis Date  . Adenomyosis 2012    Planning hysterectomy   . 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 Asthma 1988  . Thyromegaly     H/O  . ADHD (attention deficit hyperactivity disorder)     NO MEDS  . 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 Incise and drain abcess      surgical incision, wound produced mrsa  . Endometrial ablation  2011  . Tubal ligation  2010    Family History  Problem Relation Age of Onset  . Depression Sister   . Depression Mother   . Diabetes Father   . Alzheimer's disease Maternal Grandfather   . Ovarian cancer      maternal great grandmother  . Uterine cancer      maternal great aunt  . Testicular cancer Cousin     maternal cousin    History  Substance Use Topics  . Smoking status: Current Every Day Smoker -- 0.50 packs/day    Types: Cigarettes  . Smokeless tobacco: Never Used  . Alcohol Use: 0.6 oz/week    1 Cans of beer per week     Comment: ocassionally    Allergies:  Allergies  Allergen  Reactions  . Ciprofloxacin Hives  . Penicillins Hives    No prescriptions prior to admission    Review of Systems  Constitutional: Negative for fever and malaise/fatigue.  Genitourinary:       + nipple drainage   Physical Exam   Blood pressure 119/67, pulse 79, temperature 98.5 F (36.9 C), resp. rate 18, height 5' 4.5" (1.638 m), weight 166 lb 3.2 oz (75.388 kg).  Physical Exam  Nursing note and vitals reviewed. Constitutional: She is oriented to person, place, and time. She appears well-developed and well-nourished. No distress.  HENT:  Head: Normocephalic and atraumatic.  Cardiovascular: Normal rate.   Respiratory: Effort normal. Right breast exhibits no inverted nipple, no mass, no nipple discharge, no skin change and no tenderness. Left breast exhibits no inverted nipple, no mass, no nipple discharge, no skin change and no tenderness. Breasts are symmetrical.  No active discharge noted from either nipple. Unable to express discharge from left nipple. No erythema or edema of the breast bilaterally  GI: Soft. She exhibits no distension and no mass. There is no tenderness. There is no rebound and no guarding.  Neurological: She is alert and oriented to person, place, and time.  Skin: Skin is warm and dry. No erythema.  Psychiatric: She has a normal mood and affect.   Results for orders placed or performed during the hospital encounter of 12/16/14 (from the past 24 hour(s))  Pregnancy, urine POC     Status: None   Collection Time: 12/16/14  1:00 AM  Result Value Ref Range   Preg Test, Ur NEGATIVE NEGATIVE    MAU Course  Procedures None  MDM UPT - negative Patient concerned for possible pregnancy as she states with last pregnancy she had negative UPTs until "3 months"  Quant hCG drawn. Patient will be contacted with abnormal results. She denies abdominal pain or vaginal bleeding today.  Will refer patient to the breast center for further evaluation  Assessment and Plan   A: Nipple discharge Breast tenderness Negative UPT  P: Discharge home Warning signs for worsening condition discussed Patient advised to [facility] on Monday. Order sent and contact information given to patient.  Patient advised to follow-up with Dr. Gaynell FaceMarshall for routine GYN care or sooner if symptoms worsen Patient may return to MAU as needed or if her condition were to change or worsen   Marny LowensteinJulie N Idalie Canto, PA-C  12/16/2014, 1:39 AM

## 2014-12-16 NOTE — MAU Note (Signed)
Vonzella NippleJulie Wenzel PA in to talk with pt in Triage.

## 2014-12-21 ENCOUNTER — Ambulatory Visit
Admission: RE | Admit: 2014-12-21 | Discharge: 2014-12-21 | Disposition: A | Discharge status: Home / Self Care | Payer: Medicaid Other | Source: Ambulatory Visit | Attending: Medical | Admitting: Medical

## 2014-12-21 ENCOUNTER — Other Ambulatory Visit: Payer: Self-pay | Admitting: Medical

## 2014-12-21 DIAGNOSIS — N644 Mastodynia: Secondary | ICD-10-CM

## 2014-12-21 DIAGNOSIS — N6452 Nipple discharge: Secondary | ICD-10-CM

## 2014-12-28 ENCOUNTER — Inpatient Hospital Stay: Admission: RE | Admit: 2014-12-28 | Payer: Self-pay | Source: Ambulatory Visit

## 2014-12-28 ENCOUNTER — Other Ambulatory Visit: Payer: Self-pay

## 2014-12-29 ENCOUNTER — Other Ambulatory Visit: Payer: Self-pay | Admitting: Medical

## 2014-12-29 ENCOUNTER — Ambulatory Visit
Admission: RE | Admit: 2014-12-29 | Discharge: 2014-12-29 | Disposition: A | Discharge status: Home / Self Care | Payer: Medicaid Other | Source: Ambulatory Visit | Attending: Medical | Admitting: Medical

## 2014-12-29 DIAGNOSIS — N6452 Nipple discharge: Secondary | ICD-10-CM

## 2014-12-29 DIAGNOSIS — N644 Mastodynia: Secondary | ICD-10-CM

## 2015-01-01 ENCOUNTER — Other Ambulatory Visit: Payer: Self-pay | Admitting: Obstetrics

## 2015-01-01 DIAGNOSIS — N6452 Nipple discharge: Secondary | ICD-10-CM

## 2015-01-15 ENCOUNTER — Ambulatory Visit
Admission: RE | Admit: 2015-01-15 | Discharge: 2015-01-15 | Disposition: A | Discharge status: Home / Self Care | Payer: Medicaid Other | Source: Ambulatory Visit | Attending: Obstetrics | Admitting: Obstetrics

## 2015-01-15 DIAGNOSIS — N6452 Nipple discharge: Secondary | ICD-10-CM

## 2015-01-20 ENCOUNTER — Encounter (HOSPITAL_COMMUNITY): Payer: Self-pay | Admitting: *Deleted

## 2015-01-20 ENCOUNTER — Emergency Department (HOSPITAL_COMMUNITY)
Admission: EM | Admit: 2015-01-20 | Discharge: 2015-01-20 | Disposition: A | Discharge status: Home / Self Care | Payer: Medicaid Other | Attending: Emergency Medicine | Admitting: Emergency Medicine

## 2015-01-20 DIAGNOSIS — J45909 Unspecified asthma, uncomplicated: Secondary | ICD-10-CM | POA: Insufficient documentation

## 2015-01-20 DIAGNOSIS — K029 Dental caries, unspecified: Secondary | ICD-10-CM | POA: Diagnosis not present

## 2015-01-20 DIAGNOSIS — K088 Other specified disorders of teeth and supporting structures: Secondary | ICD-10-CM | POA: Diagnosis not present

## 2015-01-20 DIAGNOSIS — Z87448 Personal history of other diseases of urinary system: Secondary | ICD-10-CM | POA: Diagnosis not present

## 2015-01-20 DIAGNOSIS — Z880 Allergy status to penicillin: Secondary | ICD-10-CM | POA: Diagnosis not present

## 2015-01-20 DIAGNOSIS — Z8639 Personal history of other endocrine, nutritional and metabolic disease: Secondary | ICD-10-CM | POA: Diagnosis not present

## 2015-01-20 DIAGNOSIS — Z720 Tobacco use: Secondary | ICD-10-CM | POA: Diagnosis not present

## 2015-01-20 DIAGNOSIS — Z8659 Personal history of other mental and behavioral disorders: Secondary | ICD-10-CM | POA: Diagnosis not present

## 2015-01-20 DIAGNOSIS — Z8619 Personal history of other infectious and parasitic diseases: Secondary | ICD-10-CM | POA: Insufficient documentation

## 2015-01-20 DIAGNOSIS — Z79899 Other long term (current) drug therapy: Secondary | ICD-10-CM | POA: Insufficient documentation

## 2015-01-20 MED ORDER — CLINDAMYCIN HCL 150 MG PO CAPS: 450.0000 mg | ORAL_CAPSULE | Freq: Three times a day (TID) | ORAL | Status: DC

## 2015-01-20 NOTE — ED Provider Notes (Signed)
CSN: [id number]     Arrival date & time 01/20/15  1643 History  This chart was scribed for 97, working with Nelva Nay, MD by Elon Spanner, ED Scribe. This patient was seen in room TR07C/TR07C and the patient's care was started at 5:32 PM.   Chief Complaint  Patient presents with  . Dental Problem   The history is provided by the patient. No language interpreter was used.   HPI Comments: [name] is a 29 y.o. female who presents to the Emergency Department complaining of sharp, right-sided dental pain onset several hours ago.  She reports a "hole" developed in a left lower tooth ~ 1.5 months ago with no pain excapt for a single, brief episode of pain similar to today's complaint 1 month ago.  She took a pill containing acetaminophen, ASA, and caffeine after onset of the pain today.  This has resolved the pain at this time.  She was last seen by a dentist several years ago.  Denies fever, chills, difficulty swallowing or other concerns.    Past Medical History  Diagnosis Date  . Adenomyosis 2012    Planning hysterectomy   . 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 Asthma 1988  . Thyromegaly     H/O  . ADHD (attention deficit hyperactivity disorder)     NO MEDS  . 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 Incise and drain abcess      surgical incision, wound produced mrsa  . Endometrial ablation  2011  . Tubal ligation  2010   Family History  Problem Relation Age of Onset  . Depression Sister   . Depression Mother   . Diabetes Father   . Alzheimer's disease Maternal Grandfather   . Ovarian cancer      maternal great grandmother  . Uterine cancer      maternal great aunt  . Testicular cancer Cousin     maternal cousin   Social History  Substance Use Topics  . Smoking status: Current Every Day Smoker -- 0.50 packs/day    Types: Cigarettes  . Smokeless tobacco: Never Used  . Alcohol Use: 0.6 oz/week    1 Cans of beer per week   Comment: ocassionally   OB History    Gravida Para Term Preterm AB TAB SAB Ectopic Multiple Living   3 3 3       3      Review of Systems  Constitutional: Negative for fever, chills and appetite change.  HENT: Positive for dental problem. Negative for drooling, ear pain, facial swelling, nosebleeds, postnasal drip, rhinorrhea and trouble swallowing.   Eyes: Negative for pain and redness.  Respiratory: Negative for cough and wheezing.   Cardiovascular: Negative for chest pain.  Gastrointestinal: Negative for nausea, vomiting and abdominal pain.  Musculoskeletal: Negative for neck pain and neck stiffness.  Skin: Negative for color change and rash.  Neurological: Negative for weakness, light-headedness and headaches.  All other systems reviewed and are negative.     Allergies  Ciprofloxacin and Penicillins  Home Medications   Prior to Admission medications   Medication Sig Start Date End Date Taking? Authorizing Provider  albuterol (PROVENTIL HFA;VENTOLIN HFA) 108 (90 BASE) MCG/ACT inhaler Inhale 1 puff into the lungs every 6 (six) hours as needed for wheezing or shortness of breath.    Historical Provider, MD  clindamycin (CLEOCIN) 150 MG capsule Take 3 capsules (450 mg total)  by mouth 3 (three) times daily. 01/20/15   Jerald Hennington, PA-C  hydrocortisone cream 1 % Apply 1 application topically 2 (two) times daily as needed for itching.    Historical Provider, MD  predniSONE (STERAPRED UNI-PAK) 10 MG tablet Take by mouth daily. Day 1: take 6 tabs.  Day 2: 5 tabs  Day 3: 4 tabs  Day 4: 3 tabs  Day 5: 2 tabs  Day 6: 1 tab 02/27/14   Trixie Dredge, PA-C   BP 110/71 mmHg  Pulse 74  Temp(Src) 98.3 F (36.8 C) (Oral)  Resp 22  Wt 168 lb (76.204 kg)  SpO2 97% Physical Exam  Constitutional: She is oriented to person, place, and time. She appears well-developed and well-nourished. No distress.  HENT:  Head: Normocephalic and atraumatic.  Right Ear: Tympanic membrane, external ear and  ear canal normal.  Left Ear: Tympanic membrane, external ear and ear canal normal.  Nose: Nose normal. Right sinus exhibits no maxillary sinus tenderness and no frontal sinus tenderness. Left sinus exhibits no maxillary sinus tenderness and no frontal sinus tenderness.  Mouth/Throat: Uvula is midline, oropharynx is clear and moist and mucous membranes are normal. No oral lesions. Abnormal dentition. Dental caries present. No uvula swelling or lacerations. No oropharyngeal exudate, posterior oropharyngeal edema, posterior oropharyngeal erythema or tonsillar abscesses.  No gingival swelling, fluctuance or induration No gross abscess Mild TTP of tooth #18 with dental carries. Tooth #19 is surgically absent  Eyes: Conjunctivae and EOM are normal. Pupils are equal, round, and reactive to light. Right eye exhibits no discharge. Left eye exhibits no discharge.  Neck: Normal range of motion. Neck supple. No tracheal deviation present.  No stridor Handling secretions without difficulty No nuchal rigidity No cervical lymphadenopathy   Cardiovascular: Normal rate, regular rhythm and normal heart sounds.   Pulmonary/Chest: Effort normal. No respiratory distress.  Equal chest rise  Abdominal: Soft. Bowel sounds are normal. She exhibits no distension. There is no tenderness.  Musculoskeletal: Normal range of motion.  Lymphadenopathy:    She has no cervical adenopathy.  Neurological: She is alert and oriented to person, place, and time.  Skin: Skin is warm and dry.  Psychiatric: She has a normal mood and affect. Her behavior is normal.  Nursing note and vitals reviewed.   ED Course  Procedures (including critical care time)  DIAGNOSTIC STUDIES: Oxygen Saturation is 97% on RA, normal by my interpretation.    COORDINATION OF CARE:  5:38 PM Discussed treatment plan with patient at bedside.  Patient acknowledges and agrees with plan.    Labs Review Labs Reviewed - No data to display  Imaging  Review No results found. I personally reviewed and evaluated these images and lab results as part of my medical decision-making.   EKG Interpretation None      MDM   Final diagnoses:  Pain due to dental caries   [name] presents with dental pain.  No gross abscess.  Exam unconcerning for Ludwig's angina or spread of infection.  Will treat with penicillin and pain medicine.  Urged patient to follow-up with dentist.    BP 110/71 mmHg  Pulse 74  Temp(Src) 98.3 F (36.8 C) (Oral)  Resp 22  Wt 168 lb (76.204 kg)  SpO2 97%  I personally performed the services described in this documentation, which was scribed in my presence. The recorded information has been reviewed and is accurate.   Dahlia Client Anae Hams, PA-C 01/20/15 1757  Nelva Nay, MD 01/21/15 1537

## 2015-01-20 NOTE — ED Notes (Signed)
The pt is c/o a toothache  For several days.  Today she has lt face swelling with the pain.  lmp none

## 2015-01-20 NOTE — Discharge Instructions (Signed)
1. Medications: OTC pain medications, Clindamycin, usual home medications 2. Treatment: rest, drink plenty of fluids, take medications as prescribed 3. Follow Up: Please followup with dentistry within 1 week for discussion of your diagnoses and further evaluation after today's visit; if you do not have a primary care doctor use the resource guide provided to find one; Return to the ER for high fevers, difficulty breathing, difficulty swallowing or other concerning symptoms   . Dental Caries Dental caries (also called tooth decay) is the most common oral disease. It can occur at any age but is more common in children and young adults.  HOW DENTAL CARIES DEVELOPS  The process of decay begins when bacteria and foods (particularly sugars and starches) combine in your mouth to produce plaque. Plaque is a substance that sticks to the hard, outer surface of a tooth (enamel). The bacteria in plaque produce acids that attack enamel. These acids may also attack the root surface of a tooth (cementum) if it is exposed. Repeated attacks dissolve these surfaces and create holes in the tooth (cavities). If left untreated, the acids destroy the other layers of the tooth.  RISK FACTORS  Frequent sipping of sugary beverages.   Frequent snacking on sugary and starchy foods, especially those that easily get stuck in the teeth.   Poor oral hygiene.   Dry mouth.   Substance abuse such as methamphetamine abuse.   Broken or poor-fitting dental restorations.   Eating disorders.   Gastroesophageal reflux disease (GERD).   Certain radiation treatments to the head and neck. SYMPTOMS In the early stages of dental caries, symptoms are seldom present. Sometimes white, chalky areas may be seen on the enamel or other tooth layers. In later stages, symptoms may include:  Pits and holes on the enamel.  Toothache after sweet, hot, or cold foods or drinks are consumed.  Pain around the tooth.  Swelling  around the tooth. DIAGNOSIS  Most of the time, dental caries is detected during a regular dental checkup. A diagnosis is made after a thorough medical and dental history is taken and the surfaces of your teeth are checked for signs of dental caries. Sometimes special instruments, such as lasers, are used to check for dental caries. Dental X-ray exams may be taken so that areas not visible to the eye (such as between the contact areas of the teeth) can be checked for cavities.  TREATMENT  If dental caries is in its early stages, it may be reversed with a fluoride treatment or an application of a remineralizing agent at the dental office. Thorough brushing and flossing at home is needed to aid these treatments. If it is in its later stages, treatment depends on the location and extent of tooth destruction:   If a small area of the tooth has been destroyed, the destroyed area will be removed and cavities will be filled with a material such as gold, silver amalgam, or composite resin.   If a large area of the tooth has been destroyed, the destroyed area will be removed and a cap (crown) will be fitted over the remaining tooth structure.   If the center part of the tooth (pulp) is affected, a procedure called a root canal will be needed before a filling or crown can be placed.   If most of the tooth has been destroyed, the tooth may need to be pulled (extracted). HOME CARE INSTRUCTIONS You can prevent, stop, or reverse dental caries at home by practicing good oral hygiene. Good oral  hygiene includes:  Thoroughly cleaning your teeth at least twice a day with a toothbrush and dental floss.   Using a fluoride toothpaste. A fluoride mouth rinse may also be used if recommended by your dentist or health care provider.   Restricting the amount of sugary and starchy foods and sugary liquids you consume.   Avoiding frequent snacking on these foods and sipping of these liquids.   Keeping regular  visits with a dentist for checkups and cleanings. PREVENTION   Practice good oral hygiene.  Consider a dental sealant. A dental sealant is a coating material that is applied by your dentist to the pits and grooves of teeth. The sealant prevents food from being trapped in them. It may protect the teeth for several years.  Ask about fluoride supplements if you live in a community without fluorinated water or with water that has a low fluoride content. Use fluoride supplements as directed by your dentist or health care provider.  Allow fluoride varnish applications to teeth if directed by your dentist or health care provider. Document Released: 02/15/2002 Document Revised: 10/10/2013 Document Reviewed: 05/28/2012 [facility] Patient Information 2015 [place], [place]. This information is not intended to replace advice given to you by your health care provider. Make sure you discuss any questions you have with your health care provider.    Emergency Department Resource Guide 1) Find a Doctor and Pay Out of Pocket Although you won't have to find out who is covered by your insurance plan, it is a good idea to ask around and get recommendations. You will then need to call the office and see if the doctor you have chosen will accept you as a new patient and what types of options they offer for patients who are self-pay. Some doctors offer discounts or will set up payment plans for their patients who do not have insurance, but you will need to ask so you aren't surprised when you get to your appointment.  2) Contact Your Local Health Department Not all health departments have doctors that can see patients for sick visits, but many do, so it is worth a call to see if yours does. If you don't know where your local health department is, you can check in your phone book. The CDC also has a tool to help you locate your state's health department, and many state websites also have listings of all of their local  health departments.  3) Find a Walk-in Clinic If your illness is not likely to be very severe or complicated, you may want to try a walk in clinic. These are popping up all over the country in pharmacies, drugstores, and shopping centers. They're usually staffed by nurse practitioners or physician assistants that have been trained to treat common illnesses and complaints. They're usually fairly quick and inexpensive. However, if you have serious medical issues or chronic medical problems, these are probably not your best option.  No Primary Care Doctor: - Call Health Connect at  [phone] - they can help you locate a primary care doctor that  accepts your insurance, provides certain services, etc. - Physician Referral Service- [phone]  Chronic Pain Problems: Organization         Address  Phone   Notes  [facility]  [phone] Patients need to be referred by their primary care doctor.   Medication Assistance: Organization         Address  Phone   Notes  [facility] Medication Assistance Program 231-023-9128  [address] [place], [place] [address] [phone] --Must be a resident of [facility] -- Must have NO insurance coverage whatsoever (no Medicaid/ Medicare, etc.) -- The pt. MUST have a primary care doctor that directs their care regularly and follows them in the community   [facility]ist  ([phone]   [employer]  [phone]    Agencies that provide inexpensive medical care: Organization         Address  Phone   Notes  [facility]  [phone]   [facility]    [phone]   [facility] [address] [place], [place] [address] [phone]   [facility] [address] [place], [place] [phone]   Planned Parenthood    2191215389   [facility]    [phone]   [facility]  [address], [place] Phone:   [phone], Fax:  [phone] Hours of Operation:  9 am - 6 pm, M-F.  Also accepts Medicaid/Medicare and self-pay.  [facility]  [address], [place] Phone: [phone], Fax: [phone]. Hours of Operation:  8:30 am - 5:30 pm, M-F.  Also accepts Medicaid and self-pay.  [facility] [place] [address], [place] Phone: [phone]   [facility] [address] Natasha Bence [place], [place] [phone], Ext. [phone] Mondays & Thursdays: 7-9 AM.  First 15 patients are seen on a first come, first serve basis.    Medicaid-accepting [facility] Providers:  Organization         Address  Phone   Notes  [facility] [address],  [phone] Also accepts self-pay patients.  [facility] [address] Laurell Josephs [place], [place]  [phone]   [facility] [address], [place] [phone]   [facility] [address], [place] [phone]   Renaye Rakers [address], [place]   [phone] Only accepts [place] Access [place] patients after they have their name applied to their card.   Self-Pay (no insurance) in [facility]:  Organization         Address  Phone   Notes  Sickle Cell Patients, [facility] [address] [place], [place] [phone]   [facility] [address] [place], [place] [phone]   [facility] [place]  [address], [place] [phone]   [facility]/Dr. Osei-Bonsu  [address], [place] or [address], [place] [phone] Phone number for both [place] and [place] locations is the same.  Urgent Medical and [facility] [address], [place] [phone]   [facility] [address] [place] or [address] [phone]   [facility] [address], [place] [phone], phone; [phone], fax Sees patients 1st and 3rd Saturday of every month.  Must not qualify for public or private insurance (i.e. Medicaid, Medicare,  Health Choice, Veterans' Benefits)  Household income should be no more than 200% of the poverty level The clinic cannot treat you if you are pregnant or think you are pregnant  Sexually transmitted diseases are not treated at the clinic.  Dental Care: Organization         Address  Phone  Notes  [facility] [address] [place], [place] [phone] Accepts children up to age 28 who are enrolled in [place] or [place] Health Choice; pregnant women with a Medicaid card; and children who have applied for Medicaid or [place] Health Choice, but were declined, whose parents can pay a reduced fee at time of service.  [facility]  [address], [place] [phone] Accepts children up to age 77 who are enrolled in [place] or [place] Health Choice; pregnant women with a Medicaid card; and children who have applied for Medicaid or [place] Health Choice, but were declined, whose parents can pay a reduced fee at time of service.  [facility]  [address] [place], [place] [phone] Patients are seen by appointment only. Walk-ins are not accepted. [facility] will see patients 6 years of age and older. Monday - Tuesday (8am-5pm) Most Wednesdays (8:30-5pm) $30 per visit, cash only  [facility] Adult [facility] Access PRO[facility]  [address], [facility] [phone] Patients are seen by appointment only. Walk-ins are not accepted. [facility] will see patients 59 years of age and older. One Wednesday Evening (Monthly: Volunteer Based).  $30 per visit, cash only  [employer]  [phone] for adults;  Children under age 55, call Graduate Pediatric Dentistry at [phone]. Children aged 44-14, please call [phone] to request a pediatric application.  Dental services are provided in all areas of dental care including fillings, crowns and bridges, complete and partial dentures, implants, gum treatment, root canals, and extractions. Preventive care is also provided. Treatment is provided to both adults and children. Patients are selected via a lottery and there is often a waiting list.   [facility] [address] [place]  [phone] [email or web address]   [facility] [address] [place], [place] [phone], Ext. [phone] Second and Fourth Thursday of each month, opens at 6:30 AM; Clinic ends at 9 AM.  Patients are seen on a first-come first-served basis, and a limited number are seen during each clinic.   [facility]  [address] Ether Griffins [employer], [place] [phone]   Eligibility Requirements You must have lived in [place], [place], or [place] counties for at least the last three months.   You cannot be eligible for state or federal sponsored [place], including [employer], [place], or [employer].   You generally cannot be eligible for healthcare insurance through your employer.    How to apply: Eligibility screenings are held every Tuesday and Wednesday afternoon from 1:00 pm until 4:00 pm. You do not need an appointment for the interview!  [facility] [address], [place], [place] [phone]   [facility] Health Department  [phone]   [facility] Health Department  [phone]   [facility] Health [facility]  [phone]    Behavioral Health Resources in the Community: Intensive Outpatient Programs Organization         Address  Phone  Notes  [facility] Services [address] [place], [place] [phone]   [facility] Outpatient [address], [place], [place] [phone]   [facility] [address], [place], [place]  [phone]   [facility] Mental Health [address],  [place], [place] [phone] or [phone]   Substance Abuse Resources Organization         Address  Phone  Notes  [facility]  [phone]   [facility]  [phone]   [facility]  [phone]   Floydene Flock  [phone]   Residential & Outpatient Substance Abuse Program  [phone]   Psychological Services Organization         Address  Phone  Notes  [facility]  [phone]   [facility]  [phone]   [facility] Mental Health [address], [place] [phone] or [phone]    Mobile Crisis Teams Organization         Address  Phone  Notes  [facility], Mobile Crisis Care Unit  [phone]   [facility]  [address] [place], [place] [phone]   Doristine Locks [address] [place] [phone]    Self-Help/Support Groups Organization         Address  Phone             Notes  [facility] [place] - variety of support groups  336- I7437963 Call for more information  Narcotics Anonymous (NA), [facility] [address], [employer] [place]  2 meetings at this location   Statistician         Address  Phone  Notes  [facility] [address],    [place]  [phone]   [facility]  [address] [place] [address], [place], [place] [phone]   [facility] [address] [place], [place] [phone] Admissions: 8am-3pm M-F  [facility] [address]    [place], [place] [phone]   [facility] [address] [place], [place], [place] [phone]   [facility] [address],  [place], [place] [phone]   [facility] - Intensive  Outpatient [address] Laurell Joseph[address], [place], [place] [phone]   [facility] ([facility] [address] [place]  [place], [place] [phone] or [phone]   [facility] ([facility]) [address] [place], [place] [phone] Accepts Medicaid  [facility] [address]  [place] [phone] Substance Abuse/Addiction Treatment   [facility] Organization         Address  Phone  Notes  [facility]  5317242452   Angie Fava, PhD [address] Ervin Knack [place], [place]   [phone] or [phone]   [facility] Behavioral   [address] [place], [place] [phone]   [facility] [address] [place], [place] [phone] Insurance/Medicaid/sponsorship through [facility] [address]                                    [place], [place] [phone] Therapy/tele-psych/case  [facility] [address], [place] [phone]    Dr. Lolly Mustache  [phone]   [facility]  [employer] [facility] Dept. 1) [address]  2) [address] [place] 3)  [address], [place] [phone]  [phone]   [facility] Child Abuse Hotline 8100093642 or [phone] (After Hours)

## 2015-01-20 NOTE — ED Notes (Signed)
Declined W/C at D/C and was escorted to lobby by RN. 

## 2015-03-16 ENCOUNTER — Emergency Department (HOSPITAL_COMMUNITY)
Admission: EM | Admit: 2015-03-16 | Discharge: 2015-03-16 | Disposition: A | Discharge status: Home / Self Care | Payer: Medicaid Other | Source: Home / Self Care | Attending: Family Medicine | Admitting: Family Medicine

## 2015-03-16 ENCOUNTER — Encounter (HOSPITAL_COMMUNITY): Payer: Self-pay | Admitting: *Deleted

## 2015-03-16 ENCOUNTER — Other Ambulatory Visit (HOSPITAL_COMMUNITY)
Admission: RE | Admit: 2015-03-16 | Discharge: 2015-03-16 | Disposition: A | Discharge status: Home / Self Care | Payer: Medicaid Other | Source: Ambulatory Visit | Attending: Family Medicine | Admitting: Family Medicine

## 2015-03-16 DIAGNOSIS — N760 Acute vaginitis: Secondary | ICD-10-CM | POA: Diagnosis present

## 2015-03-16 DIAGNOSIS — Z711 Person with feared health complaint in whom no diagnosis is made: Secondary | ICD-10-CM | POA: Diagnosis not present

## 2015-03-16 DIAGNOSIS — Z113 Encounter for screening for infections with a predominantly sexual mode of transmission: Secondary | ICD-10-CM | POA: Insufficient documentation

## 2015-03-16 LAB — POCT URINALYSIS DIP (DEVICE)
BILIRUBIN URINE: NEGATIVE
Glucose, UA: NEGATIVE mg/dL
Ketones, ur: NEGATIVE mg/dL
Leukocytes, UA: NEGATIVE
Nitrite: NEGATIVE
PH: 7.5 (ref 5.0–8.0)
Protein, ur: NEGATIVE mg/dL
Specific Gravity, Urine: 1.02 (ref 1.005–1.030)
Urobilinogen, UA: 1 mg/dL (ref 0.0–1.0)

## 2015-03-16 LAB — POCT PREGNANCY, URINE: Preg Test, Ur: NEGATIVE

## 2015-03-16 NOTE — ED Provider Notes (Signed)
CSN: [id number]     Arrival date & time 03/16/15  1826 History   First MD Initiated Contact with Patient 03/16/15 1857     Chief Complaint  Patient presents with  . Exposure to STD   (Consider location/radiation/quality/duration/timing/severity/associated sxs/prior Treatment) Patient is a 29 y.o. female presenting with STD exposure. The history is provided by the patient.  Exposure to STD This is a new problem. The current episode started more than 1 week ago. The problem has not changed since onset.Associated symptoms include abdominal pain and headaches. Associated symptoms comments: Pt stressed over poss std exposure, she has no sx, has never had any std in past, no menses from ablation..    Past Medical History  Diagnosis Date  . Adenomyosis 2012    Planning hysterectomy   . 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 Asthma 1988  . Thyromegaly     H/O  . ADHD (attention deficit hyperactivity disorder)     NO MEDS  . 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 Incise and drain abcess      surgical incision, wound produced mrsa  . Endometrial ablation  2011  . Tubal ligation  2010   Family History  Problem Relation Age of Onset  . Depression Sister   . Depression Mother   . Diabetes Father   . Alzheimer's disease Maternal Grandfather   . Ovarian cancer      maternal great grandmother  . Uterine cancer      maternal great aunt  . Testicular cancer Cousin     maternal cousin   Social History  Substance Use Topics  . Smoking status: Current Every Day Smoker -- 0.50 packs/day    Types: Cigarettes  . Smokeless tobacco: Never Used  . Alcohol Use: 0.6 oz/week    1 Cans of beer per week     Comment: ocassionally   OB History    Gravida Para Term Preterm AB TAB SAB Ectopic Multiple Living   Review of Systems  Constitutional: Negative.  Negative for fever.  Gastrointestinal: Positive for nausea and abdominal pain.  Negative for vomiting and diarrhea.  Genitourinary: Positive for menstrual problem. Negative for dysuria, vaginal bleeding and vaginal discharge.  Musculoskeletal: Negative.   Skin: Negative.   Neurological: Positive for headaches.  All other systems reviewed and are negative.   Allergies  Ciprofloxacin and Penicillins  Home Medications   Prior to Admission medications   Medication Sig Start Date End Date Taking? Authorizing Provider  albuterol (PROVENTIL HFA;VENTOLIN HFA) 108 (90 BASE) MCG/ACT inhaler Inhale 1 puff into the lungs every 6 (six) hours as needed for wheezing or shortness of breath.    Historical Provider, MD  clindamycin (CLEOCIN) 150 MG capsule Take 3 capsules (450 mg total) by mouth 3 (three) times daily. 01/20/15   Hannah Muthersbaugh, PA-C  hydrocortisone cream 1 % Apply 1 application topically 2 (two) times daily as needed for itching.    Historical Provider, MD  predniSONE (STERAPRED UNI-PAK) 10 MG tablet Take by mouth daily. Day 1: take 6 tabs.  Day 2: 5 tabs  Day 3: 4 tabs  Day 4: 3 tabs  Day 5: 2 tabs  Day 6: 1 tab 02/27/14   Trixie Dredge, PA-C   Meds Ordered and Administered this Visit  Medications - No data to display  BP 128/80 mmHg  Pulse 84  Temp(Src) 99.3 F (37.4 C) (Oral)  Resp 16  SpO2 98% No data found.   Physical Exam  Constitutional: She is oriented to person, place, and time. She appears well-developed and well-nourished. She appears distressed.  Abdominal: Soft. Bowel sounds are normal. There is no tenderness. Hernia confirmed negative in the right inguinal area and confirmed negative in the left inguinal area.  Genitourinary: Vagina normal and uterus normal. Pelvic exam was performed with patient supine. Cervix exhibits no motion tenderness, no discharge and no friability. Right adnexum displays no mass, no tenderness and no fullness. Left adnexum displays tenderness and fullness. Left adnexum displays no mass. No tenderness in the vagina. No  foreign body around the vagina. No vaginal discharge found.  Musculoskeletal: Normal range of motion.  Lymphadenopathy:       Right: No inguinal adenopathy present.       Left: No inguinal adenopathy present.  Neurological: She is alert and oriented to person, place, and time.  Skin: Skin is warm and dry.  Nursing note and vitals reviewed.   ED Course  Procedures (including critical care time)  Labs Review Labs Reviewed  POCT URINALYSIS DIP (DEVICE) - Abnormal; Notable for the following:    Hgb urine dipstick SMALL (*)    All other components within normal limits  HIV ANTIBODY (ROUTINE TESTING)  RPR  POCT PREGNANCY, URINE  CERVICOVAGINAL ANCILLARY ONLY    Imaging Review No results found.   Visual Acuity Review  Right Eye Distance:   Left Eye Distance:   Bilateral Distance:    Right Eye Near:   Left Eye Near:    Bilateral Near:         MDM   1. Concern about STD in female without diagnosis       Linna Hoff, MD 03/16/15 [phone]

## 2015-03-16 NOTE — ED Notes (Signed)
Pt  Wants  To  Be  Checked  For  Std        She  States   She  Has     Been privvy  To   increminating      Occurences     In regards  To     Partner      she  denys  Any  Vaginal  Discharge  Bleeding  Cramps  Or  Sores    She  Does  Report  Some  Nausea

## 2015-03-16 NOTE — Discharge Instructions (Signed)
We will call with positive test results and treat as indicated  °

## 2015-03-17 LAB — HIV ANTIBODY (ROUTINE TESTING W REFLEX): HIV SCREEN 4TH GENERATION: NONREACTIVE

## 2015-03-17 LAB — RPR: RPR: NONREACTIVE

## 2015-03-19 LAB — CERVICOVAGINAL ANCILLARY ONLY
CHLAMYDIA, DNA PROBE: NEGATIVE
Neisseria Gonorrhea: NEGATIVE

## 2015-03-19 NOTE — ED Notes (Addendum)
Final report of ID screenings available for review. GC, chlamydia negative, RPR negative , HIV negative.yeast, trich, gardnerella negative.  No furter action required

## 2015-03-20 LAB — CERVICOVAGINAL ANCILLARY ONLY: Wet Prep (BD Affirm): NEGATIVE

## 2015-05-30 ENCOUNTER — Emergency Department (HOSPITAL_COMMUNITY)
Admission: EM | Admit: 2015-05-30 | Discharge: 2015-05-31 | Disposition: A | Discharge status: Home / Self Care | Payer: Medicaid Other | Attending: Emergency Medicine | Admitting: Emergency Medicine

## 2015-05-30 ENCOUNTER — Encounter (HOSPITAL_COMMUNITY): Payer: Self-pay | Admitting: Emergency Medicine

## 2015-05-30 ENCOUNTER — Emergency Department (HOSPITAL_COMMUNITY): Payer: Medicaid Other

## 2015-05-30 DIAGNOSIS — Z8619 Personal history of other infectious and parasitic diseases: Secondary | ICD-10-CM | POA: Diagnosis not present

## 2015-05-30 DIAGNOSIS — Z79899 Other long term (current) drug therapy: Secondary | ICD-10-CM | POA: Diagnosis not present

## 2015-05-30 DIAGNOSIS — Z8659 Personal history of other mental and behavioral disorders: Secondary | ICD-10-CM | POA: Insufficient documentation

## 2015-05-30 DIAGNOSIS — Z8639 Personal history of other endocrine, nutritional and metabolic disease: Secondary | ICD-10-CM | POA: Diagnosis not present

## 2015-05-30 DIAGNOSIS — R1031 Right lower quadrant pain: Secondary | ICD-10-CM | POA: Diagnosis present

## 2015-05-30 DIAGNOSIS — Z880 Allergy status to penicillin: Secondary | ICD-10-CM | POA: Diagnosis not present

## 2015-05-30 DIAGNOSIS — J45909 Unspecified asthma, uncomplicated: Secondary | ICD-10-CM | POA: Insufficient documentation

## 2015-05-30 DIAGNOSIS — F17210 Nicotine dependence, cigarettes, uncomplicated: Secondary | ICD-10-CM | POA: Insufficient documentation

## 2015-05-30 DIAGNOSIS — Z3202 Encounter for pregnancy test, result negative: Secondary | ICD-10-CM | POA: Insufficient documentation

## 2015-05-30 DIAGNOSIS — N83201 Unspecified ovarian cyst, right side: Secondary | ICD-10-CM | POA: Insufficient documentation

## 2015-05-30 DIAGNOSIS — Z792 Long term (current) use of antibiotics: Secondary | ICD-10-CM | POA: Insufficient documentation

## 2015-05-30 LAB — COMPREHENSIVE METABOLIC PANEL
ALBUMIN: 4.4 g/dL (ref 3.5–5.0)
ALT: 19 U/L (ref 14–54)
AST: 21 U/L (ref 15–41)
Alkaline Phosphatase: 63 U/L (ref 38–126)
Anion gap: 9 (ref 5–15)
BILIRUBIN TOTAL: 0.6 mg/dL (ref 0.3–1.2)
BUN: 9 mg/dL (ref 6–20)
CHLORIDE: 106 mmol/L (ref 101–111)
CO2: 27 mmol/L (ref 22–32)
Calcium: 9.3 mg/dL (ref 8.9–10.3)
Creatinine, Ser: 0.97 mg/dL (ref 0.44–1.00)
GFR calc Af Amer: >60 mL/min (ref >60)
GFR calc non Af Amer: >60 mL/min (ref >60)
GLUCOSE: 130 mg/dL — AB (ref 65–99)
POTASSIUM: 2.8 mmol/L — AB (ref 3.5–5.1)
SODIUM: 142 mmol/L (ref 135–145)
Total Protein: 7.3 g/dL (ref 6.5–8.1)

## 2015-05-30 LAB — URINALYSIS, ROUTINE W REFLEX MICROSCOPIC
BILIRUBIN URINE: NEGATIVE
GLUCOSE, UA: NEGATIVE mg/dL
KETONES UR: 15 mg/dL — AB
Nitrite: NEGATIVE
PH: 6.5 (ref 5.0–8.0)
Protein, ur: 30 mg/dL — AB
Specific Gravity, Urine: 1.02 (ref 1.005–1.030)

## 2015-05-30 LAB — CBC
HEMATOCRIT: 38.6 % (ref 36.0–46.0)
Hemoglobin: 13.2 g/dL (ref 12.0–15.0)
MCH: 32.6 pg (ref 26.0–34.0)
MCHC: 34.2 g/dL (ref 30.0–36.0)
MCV: 95.3 fL (ref 78.0–100.0)
Platelets: 271 10*3/uL (ref 150–400)
RBC: 4.05 MIL/uL (ref 3.87–5.11)
RDW: 12.6 % (ref 11.5–15.5)
WBC: 6.7 10*3/uL (ref 4.0–10.5)

## 2015-05-30 LAB — POC URINE PREG, ED: Preg Test, Ur: NEGATIVE

## 2015-05-30 LAB — URINE MICROSCOPIC-ADD ON

## 2015-05-30 LAB — LIPASE, BLOOD: LIPASE: 104 U/L — AB (ref 11–51)

## 2015-05-30 MED ORDER — POTASSIUM CHLORIDE 10 MEQ/100ML IV SOLN: 10.0000 meq | Freq: Once | INTRAVENOUS | Status: DC

## 2015-05-30 MED ORDER — HYDROMORPHONE HCL 1 MG/ML IJ SOLN
1.0000 mg | Freq: Once | INTRAMUSCULAR | Status: AC
Start: 2015-05-31 — End: 2015-05-31
  Administered 2015-05-31: 1 mg via INTRAVENOUS
  Filled 2015-05-30: qty 1

## 2015-05-30 MED ORDER — ONDANSETRON HCL 4 MG/2ML IJ SOLN
4.0000 mg | Freq: Once | INTRAMUSCULAR | Status: AC | PRN
  Administered 2015-05-30: 4 mg via INTRAVENOUS
  Filled 2015-05-30: qty 2

## 2015-05-30 MED ORDER — ONDANSETRON HCL 4 MG/2ML IJ SOLN: 4.0000 mg | Freq: Once | INTRAMUSCULAR | Status: DC

## 2015-05-30 MED ORDER — KETOROLAC TROMETHAMINE 30 MG/ML IJ SOLN
30.0000 mg | Freq: Once | INTRAMUSCULAR | Status: AC
  Administered 2015-05-30: 30 mg via INTRAVENOUS
  Filled 2015-05-30: qty 1

## 2015-05-30 MED ORDER — HYDROMORPHONE HCL 1 MG/ML IJ SOLN
1.0000 mg | Freq: Once | INTRAMUSCULAR | Status: AC
  Administered 2015-05-30: 1 mg via INTRAVENOUS
  Filled 2015-05-30: qty 1

## 2015-05-30 MED ORDER — SODIUM CHLORIDE 0.9 % IV BOLUS (SEPSIS)
1000.0000 mL | Freq: Once | INTRAVENOUS | Status: AC
  Administered 2015-05-30: 1000 mL via INTRAVENOUS

## 2015-05-30 MED ORDER — FENTANYL CITRATE (PF) 100 MCG/2ML IJ SOLN
50.0000 ug | Freq: Once | INTRAMUSCULAR | Status: AC
  Administered 2015-05-30: 50 ug via INTRAVENOUS
  Filled 2015-05-30: qty 2

## 2015-05-30 MED ORDER — POTASSIUM CHLORIDE CRYS ER 20 MEQ PO TBCR
40.0000 meq | EXTENDED_RELEASE_TABLET | Freq: Once | ORAL | Status: AC
  Administered 2015-05-31: 40 meq via ORAL
  Filled 2015-05-30: qty 2

## 2015-05-30 NOTE — ED Notes (Signed)
Nurse drawing labs. 

## 2015-05-30 NOTE — ED Notes (Signed)
Pt returned from CT via stretcher.

## 2015-05-30 NOTE — ED Notes (Signed)
Pt to CT via stretcher

## 2015-05-30 NOTE — ED Provider Notes (Signed)
CSN: [id number]     Arrival date & time 05/30/15  2203 History   First MD Initiated Contact with Patient 05/30/15 2230     Chief Complaint  Patient presents with  . Abdominal Pain     (Consider location/radiation/quality/duration/timing/severity/associated sxs/prior Treatment) HPI   [name] is a 29 y.o. female with PMH significant for adenomyosis, asthma, ADHD who presents with sudden onset, constant, severe RLQ pain that radiates to the right back that began approximately 7 PM this evening.  She describes it as cramping and "it feels like I'm having contractions or I'm in labor".   Made worse with movement.  She took a percocet with minimal relief.  Associated sxs include N/V and increased urinary frequency.  Denies fever, CP, SOB, hematuria, dysuria, vaginal bleeding, vaginal discharge, bloody stools, or flank pain.   Past Medical History  Diagnosis Date  . Adenomyosis 2012    Planning hysterectomy   . 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 Asthma 1988  . Thyromegaly     H/O  . ADHD (attention deficit hyperactivity disorder)     NO MEDS  . 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 Incise and drain abcess      surgical incision, wound produced mrsa  . Endometrial ablation  2011  . Tubal ligation  2010   Family History  Problem Relation Age of Onset  . Depression Sister   . Depression Mother   . Diabetes Father   . Alzheimer's disease Maternal Grandfather   . Ovarian cancer      maternal great grandmother  . Uterine cancer      maternal great aunt  . Testicular cancer Cousin     maternal cousin   Social History  Substance Use Topics  . Smoking status: Current Every Day Smoker -- 0.50 packs/day    Types: Cigarettes  . Smokeless tobacco: Never Used  . Alcohol Use: 0.6 oz/week    1 Cans of beer per week     Comment: ocassionally   OB History    Gravida Para Term Preterm AB TAB SAB Ectopic Multiple Living   Review of Systems All other systems negative unless otherwise stated in HPI    Allergies  Ciprofloxacin and Penicillins  Home Medications   Prior to Admission medications   Medication Sig Start Date End Date Taking? Authorizing Provider  albuterol (PROVENTIL HFA;VENTOLIN HFA) 108 (90 BASE) MCG/ACT inhaler Inhale 1 puff into the lungs every 6 (six) hours as needed for wheezing or shortness of breath.   Yes Historical Provider, MD  clindamycin (CLEOCIN) 150 MG capsule Take 3 capsules (450 mg total) by mouth 3 (three) times daily. 01/20/15  Yes Hannah Muthersbaugh, PA-C  oxyCODONE-acetaminophen (PERCOCET/ROXICET) 5-325 MG tablet Take 1 tablet by mouth every 4 (four) hours as needed for moderate pain or severe pain.   Yes Historical Provider, MD  predniSONE (STERAPRED UNI-PAK) 10 MG tablet Take by mouth daily. Day 1: take 6 tabs.  Day 2: 5 tabs  Day 3: 4 tabs  Day 4: 3 tabs  Day 5: 2 tabs  Day 6: 1 tab Patient not taking: Reported on 05/30/2015 02/27/14   Trixie Dredge, PA-C   BP 128/79 mmHg  Pulse 77  Temp(Src) 98.7 F (37.1 C) (Oral)  Resp 18  Wt 74.844 kg  SpO2 99%  LMP  Physical Exam  Constitutional: She is oriented to person, place, and time. She appears well-developed and well-nourished.  Patient appears visibly uncomfortable, moaning, writhing in pain curled up in the bed.  HENT:  Head: Normocephalic and atraumatic.  Mouth/Throat: Oropharynx is clear and moist.  Eyes: Conjunctivae are normal. Pupils are equal, round, and reactive to light.  Neck: Normal range of motion. Neck supple.  Cardiovascular: Normal rate, regular rhythm and normal heart sounds.   No murmur heard. Pulmonary/Chest: Effort normal and breath sounds normal. No accessory muscle usage or stridor. No respiratory distress. She has no wheezes. She has no rhonchi. She has no rales.  Abdominal: Soft. Bowel sounds are normal. She exhibits no distension. There is tenderness in the right lower quadrant. There is no  rigidity, no rebound, no guarding and no CVA tenderness.  Musculoskeletal: Normal range of motion.  Lymphadenopathy:    She has no cervical adenopathy.  Neurological: She is alert and oriented to person, place, and time.  Speech clear without dysarthria.  Skin: Skin is warm and dry.  Psychiatric: She has a normal mood and affect. Her behavior is normal.    ED Course  Procedures (including critical care time) Labs Review Labs Reviewed  LIPASE, BLOOD - Abnormal; Notable for the following:    Lipase 104 (*)    All other components within normal limits  COMPREHENSIVE METABOLIC PANEL - Abnormal; Notable for the following:    Potassium 2.8 (*)    Glucose, Bld 130 (*)    All other components within normal limits  URINALYSIS, ROUTINE W REFLEX MICROSCOPIC (NOT AT [facility]) - Abnormal; Notable for the following:    APPearance CLOUDY (*)    Hgb urine dipstick MODERATE (*)    Ketones, ur 15 (*)    Protein, ur 30 (*)    Leukocytes, UA SMALL (*)    All other components within normal limits  URINE MICROSCOPIC-ADD ON - Abnormal; Notable for the following:    Squamous Epithelial / LPF 6-30 (*)    Bacteria, UA MANY (*)    All other components within normal limits  CBC  POC URINE PREG, ED    Imaging Review Ct Renal Stone Study  05/30/2015  CLINICAL DATA:  29 year old female with right lower quadrant abdominal pain, nausea and vomiting. EXAM: CT ABDOMEN AND PELVIS WITHOUT CONTRAST TECHNIQUE: Multidetector CT imaging of the abdomen and pelvis was performed following the standard protocol without IV contrast. COMPARISON:  Radiograph dated 02/28 08/07/2011 FINDINGS: Evaluation of this exam is limited in the absence of intravenous contrast. A marker The visualized lung bases are clear. No intra-abdominal free air or free fluid. The liver, gallbladder, pancreas, spleen, adrenal glands, left kidney and left ureter, and urinary bladder appear unremarkable. There is mild right hydronephrosis with mild  perinephric haziness. No calculus identified. Findings may be related to a recently passed right renal calculus versus pyelonephritis. Correlation with urinalysis recommended. No fluid collection identified. The uterus is anteverted. A 1.8 cm right ovarian dominant follicle/cyst noted. There is moderate stool within the rectosigmoid. No evidence of bowel obstruction or inflammation. Normal appendix. There is a small hiatal hernia. The abdominal aorta and IVC appear grossly unremarkable on this noncontrast study. No portal venous gas identified. There is no adenopathy. The abdominal wall soft tissues appear unremarkable. There bilateral L5 pars defects with grade 1 L5-S1 anterolisthesis. No acute fracture. IMPRESSION: Mild right hydronephrosis likely related to a recently passed right renal calculus versus pyelonephritis. Correlation with urinalysis mm. No renal  stone identified. No evidence of bowel obstruction or inflammation.  Normal appendix. A 1.8 cm right ovarian dominant follicle/cyst. Electronically Signed   By: Elgie Collard M.D.   On: 05/30/2015 23:51   I have personally reviewed and evaluated these images and lab results as part of my medical decision-making.   EKG Interpretation None      MDM   Final diagnoses:  RLQ abdominal pain  Cyst of right ovary    Patient presents with sudden onset RLQ pain that started approximately 7 PM this evening.  No fever.  VSS, NAD.  On exam, heart RRR, lungs CTAB, abdomen soft with generalized tenderness.  Patient appears visibly uncomfortable and curled up in pain.  Will obtain labs.  Will give fluids and manage pain.  Will obtain CT renal stone study.  Concern for nephrolithiasis, ovarian torsion, ovarian cyst, appendicitis.    UA shows hematuria with leukocytes.  No signs of infection.  CBC unremarkable.  CMP shows K 2.8, Cr 0.97  Will give 40 meq PO. Lipase 104, no indication of pancreatitis. CT renal shows right hydronephrosis likely related to  recently passed R renal calculus vs pyelonephritis.  No renal stone identified.  Doubt pyelonephritis.  A 1.8 cm right ovarian dominant follicle/cyst. Will obtain pelvic ultrasound to r/o torsion.  Upon reassessment, patient's pain much improved.  Anticipate discharge following pelvic ultrasound with Norco, Motrin and gynecology follow up.    Patient care hand off to [facility], PA-C at shift change who will follow up on ultrasounds and appropriate disposition.  Case has been discussed with and seen by Dr. Patria Mane who agrees with the above plan.     Cheri Fowler, PA-C 05/31/15 0121  Azalia Bilis, MD 06/01/15 [phone]

## 2015-05-30 NOTE — ED Notes (Signed)
Pt states symptoms began 2 hours ago, emesis x 4, denies diarrhea. RLQ pain, severe, guarding, pt rolling around bed, not following directions, continually asking for pain medication.

## 2015-05-30 NOTE — ED Notes (Signed)
Pt transported from home with c/o RLQ pain onset to day, +n/v, pt took percocet 2 hours ago with no relief

## 2015-05-31 ENCOUNTER — Emergency Department (HOSPITAL_COMMUNITY): Payer: Medicaid Other

## 2015-05-31 MED ORDER — HYDROCODONE-ACETAMINOPHEN 5-325 MG PO TABS: 2.0000 | ORAL_TABLET | ORAL | Status: DC | PRN

## 2015-05-31 MED ORDER — IBUPROFEN 800 MG PO TABS: 800.0000 mg | ORAL_TABLET | Freq: Three times a day (TID) | ORAL | Status: DC

## 2015-05-31 MED ORDER — HYDROCODONE-ACETAMINOPHEN 5-325 MG PO TABS: 1.0000 | ORAL_TABLET | ORAL | Status: DC | PRN

## 2015-05-31 NOTE — Discharge Instructions (Signed)
Ovarian Cyst An ovarian cyst is a fluid-filled sac that forms on an ovary. The ovaries are small organs that produce eggs in women. Various types of cysts can form on the ovaries. Most are not cancerous. Many do not cause problems, and they often go away on their own. Some may cause symptoms and require treatment. Common types of ovarian cysts include:  Functional cysts--These cysts may occur every month during the menstrual cycle. This is normal. The cysts usually go away with the next menstrual cycle if the woman does not get pregnant. Usually, there are no symptoms with a functional cyst.  Endometrioma cysts--These cysts form from the tissue that lines the uterus. They are also called "chocolate cysts" because they become filled with blood that turns brown. This type of cyst can cause pain in the lower abdomen during intercourse and with your menstrual period.  Cystadenoma cysts--This type develops from the cells on the outside of the ovary. These cysts can get very big and cause lower abdomen pain and pain with intercourse. This type of cyst can twist on itself, cut off its blood supply, and cause severe pain. It can also easily rupture and cause a lot of pain.  Dermoid cysts--This type of cyst is sometimes found in both ovaries. These cysts may contain different kinds of body tissue, such as skin, teeth, hair, or cartilage. They usually do not cause symptoms unless they get very big.  Theca lutein cysts--These cysts occur when too much of a certain hormone (human chorionic gonadotropin) is produced and overstimulates the ovaries to produce an egg. This is most common after procedures used to assist with the conception of a baby (in vitro fertilization). CAUSES   Fertility drugs can cause a condition in which multiple large cysts are formed on the ovaries. This is called ovarian hyperstimulation syndrome.  A condition called polycystic ovary syndrome can cause hormonal imbalances that can lead to  nonfunctional ovarian cysts. SIGNS AND SYMPTOMS  Many ovarian cysts do not cause symptoms. If symptoms are present, they may include:  Pelvic pain or pressure.  Pain in the lower abdomen.  Pain during sexual intercourse.  Increasing girth (swelling) of the abdomen.  Abnormal menstrual periods.  Increasing pain with menstrual periods.  Stopping having menstrual periods without being pregnant. DIAGNOSIS  These cysts are commonly found during a routine or annual pelvic exam. Tests may be ordered to find out more about the cyst. These tests may include:  Ultrasound.  X-ray of the pelvis.  CT scan.  MRI.  Blood tests. TREATMENT  Many ovarian cysts go away on their own without treatment. Your health care provider may want to check your cyst regularly for 2-3 months to see if it changes. For women in menopause, it is particularly important to monitor a cyst closely because of the higher rate of ovarian cancer in menopausal women. When treatment is needed, it may include any of the following:  A procedure to drain the cyst (aspiration). This may be done using a long needle and ultrasound. It can also be done through a laparoscopic procedure. This involves using a thin, lighted tube with a tiny camera on the end (laparoscope) inserted through a small incision.  Surgery to remove the whole cyst. This may be done using laparoscopic surgery or an open surgery involving a larger incision in the lower abdomen.  Hormone treatment or birth control pills. These methods are sometimes used to help dissolve a cyst. HOME CARE INSTRUCTIONS   Only take over-the-counter   or prescription medicines as directed by your health care provider.  Follow up with your health care provider as directed.  Get regular pelvic exams and Pap tests. SEEK MEDICAL CARE IF:   Your periods are late, irregular, or painful, or they stop.  Your pelvic pain or abdominal pain does not go away.  Your abdomen becomes  larger or swollen.  You have pressure on your bladder or trouble emptying your bladder completely.  You have pain during sexual intercourse.  You have feelings of fullness, pressure, or discomfort in your stomach.  You lose weight for no apparent reason.  You feel generally ill.  You become constipated.  You lose your appetite.  You develop acne.  You have an increase in body and facial hair.  You are gaining weight, without changing your exercise and eating habits.  You think you are pregnant. SEEK IMMEDIATE MEDICAL CARE IF:   You have increasing abdominal pain.  You feel sick to your stomach (nauseous), and you throw up (vomit).  You develop a fever that comes on suddenly.  You have abdominal pain during a bowel movement.  Your menstrual periods become heavier than usual. MAKE SURE YOU:  Understand these instructions.  Will watch your condition.  Will get help right away if you are not doing well or get worse.   This information is not intended to replace advice given to you by your health care provider. Make sure you discuss any questions you have with your health care provider.   Document Released: 05/26/2005 Document Revised: 05/31/2013 Document Reviewed: 01/31/2013 Elsevier Interactive Patient Education 2016 [employer]  

## 2015-06-02 ENCOUNTER — Emergency Department (HOSPITAL_COMMUNITY): Payer: Medicaid Other

## 2015-06-02 ENCOUNTER — Encounter (HOSPITAL_COMMUNITY): Payer: Self-pay | Admitting: Oncology

## 2015-06-02 ENCOUNTER — Inpatient Hospital Stay (HOSPITAL_COMMUNITY)
Admission: EM | Admit: 2015-06-02 | Discharge: 2015-06-05 | DRG: 872 | Disposition: A | Discharge status: Home / Self Care | Payer: Medicaid Other | Attending: Internal Medicine | Admitting: Internal Medicine

## 2015-06-02 DIAGNOSIS — T361X5A Adverse effect of cephalosporins and other beta-lactam antibiotics, initial encounter: Secondary | ICD-10-CM | POA: Diagnosis not present

## 2015-06-02 DIAGNOSIS — Z791 Long term (current) use of non-steroidal anti-inflammatories (NSAID): Secondary | ICD-10-CM | POA: Diagnosis not present

## 2015-06-02 DIAGNOSIS — Z79899 Other long term (current) drug therapy: Secondary | ICD-10-CM

## 2015-06-02 DIAGNOSIS — Z880 Allergy status to penicillin: Secondary | ICD-10-CM

## 2015-06-02 DIAGNOSIS — L270 Generalized skin eruption due to drugs and medicaments taken internally: Secondary | ICD-10-CM | POA: Diagnosis not present

## 2015-06-02 DIAGNOSIS — Z8041 Family history of malignant neoplasm of ovary: Secondary | ICD-10-CM

## 2015-06-02 DIAGNOSIS — Z820 Family history of epilepsy and other diseases of the nervous system: Secondary | ICD-10-CM | POA: Diagnosis not present

## 2015-06-02 DIAGNOSIS — N179 Acute kidney failure, unspecified: Secondary | ICD-10-CM | POA: Diagnosis not present

## 2015-06-02 DIAGNOSIS — Z881 Allergy status to other antibiotic agents status: Secondary | ICD-10-CM | POA: Diagnosis not present

## 2015-06-02 DIAGNOSIS — F17200 Nicotine dependence, unspecified, uncomplicated: Secondary | ICD-10-CM | POA: Diagnosis present

## 2015-06-02 DIAGNOSIS — E876 Hypokalemia: Secondary | ICD-10-CM | POA: Diagnosis not present

## 2015-06-02 DIAGNOSIS — N12 Tubulo-interstitial nephritis, not specified as acute or chronic: Secondary | ICD-10-CM

## 2015-06-02 DIAGNOSIS — Z818 Family history of other mental and behavioral disorders: Secondary | ICD-10-CM

## 2015-06-02 DIAGNOSIS — N390 Urinary tract infection, site not specified: Secondary | ICD-10-CM

## 2015-06-02 DIAGNOSIS — B964 Proteus (mirabilis) (morganii) as the cause of diseases classified elsewhere: Secondary | ICD-10-CM | POA: Diagnosis present

## 2015-06-02 DIAGNOSIS — F909 Attention-deficit hyperactivity disorder, unspecified type: Secondary | ICD-10-CM | POA: Diagnosis present

## 2015-06-02 DIAGNOSIS — G43909 Migraine, unspecified, not intractable, without status migrainosus: Secondary | ICD-10-CM | POA: Diagnosis present

## 2015-06-02 DIAGNOSIS — J4520 Mild intermittent asthma, uncomplicated: Secondary | ICD-10-CM | POA: Diagnosis present

## 2015-06-02 DIAGNOSIS — Z833 Family history of diabetes mellitus: Secondary | ICD-10-CM | POA: Diagnosis not present

## 2015-06-02 DIAGNOSIS — Z8049 Family history of malignant neoplasm of other genital organs: Secondary | ICD-10-CM | POA: Diagnosis not present

## 2015-06-02 DIAGNOSIS — A419 Sepsis, unspecified organism: Secondary | ICD-10-CM | POA: Diagnosis not present

## 2015-06-02 DIAGNOSIS — D696 Thrombocytopenia, unspecified: Secondary | ICD-10-CM | POA: Diagnosis present

## 2015-06-02 DIAGNOSIS — H53141 Visual discomfort, right eye: Secondary | ICD-10-CM | POA: Diagnosis present

## 2015-06-02 DIAGNOSIS — R6520 Severe sepsis without septic shock: Secondary | ICD-10-CM | POA: Diagnosis present

## 2015-06-02 HISTORY — DX: Tubulo-interstitial nephritis, not specified as acute or chronic: N12

## 2015-06-02 HISTORY — DX: Acute kidney failure, unspecified: N17.9

## 2015-06-02 LAB — URINALYSIS, ROUTINE W REFLEX MICROSCOPIC
GLUCOSE, UA: NEGATIVE mg/dL
Nitrite: NEGATIVE
Protein, ur: >300 mg/dL — AB
Specific Gravity, Urine: 1.019 (ref 1.005–1.030)
pH: 6 (ref 5.0–8.0)

## 2015-06-02 LAB — BASIC METABOLIC PANEL
ANION GAP: 9 (ref 5–15)
BUN: 8 mg/dL (ref 6–20)
CALCIUM: 7.7 mg/dL — AB (ref 8.9–10.3)
CO2: 22 mmol/L (ref 22–32)
CREATININE: 1.13 mg/dL — AB (ref 0.44–1.00)
Chloride: 105 mmol/L (ref 101–111)
GFR calc Af Amer: >60 mL/min (ref >60)
GLUCOSE: 125 mg/dL — AB (ref 65–99)
Potassium: 3.8 mmol/L (ref 3.5–5.1)
Sodium: 136 mmol/L (ref 135–145)

## 2015-06-02 LAB — URINE MICROSCOPIC-ADD ON

## 2015-06-02 LAB — MRSA PCR SCREENING: MRSA by PCR: NEGATIVE

## 2015-06-02 LAB — CBC WITH DIFFERENTIAL/PLATELET
Basophils Absolute: 0 10*3/uL (ref 0.0–0.1)
Basophils Relative: 0 %
EOS ABS: 0 10*3/uL (ref 0.0–0.7)
EOS PCT: 0 %
HCT: 38 % (ref 36.0–46.0)
Hemoglobin: 13.3 g/dL (ref 12.0–15.0)
LYMPHS ABS: 0.7 10*3/uL (ref 0.7–4.0)
LYMPHS PCT: 4 %
MCH: 32.5 pg (ref 26.0–34.0)
MCHC: 35 g/dL (ref 30.0–36.0)
MCV: 92.9 fL (ref 78.0–100.0)
MONO ABS: 0.8 10*3/uL (ref 0.1–1.0)
Monocytes Relative: 5 %
Neutro Abs: 14.2 10*3/uL — ABNORMAL HIGH (ref 1.7–7.7)
Neutrophils Relative %: 91 %
PLATELETS: 142 10*3/uL — AB (ref 150–400)
RBC: 4.09 MIL/uL (ref 3.87–5.11)
RDW: 12.7 % (ref 11.5–15.5)
WBC: 15.8 10*3/uL — ABNORMAL HIGH (ref 4.0–10.5)

## 2015-06-02 LAB — COMPREHENSIVE METABOLIC PANEL
ALBUMIN: 3.1 g/dL — AB (ref 3.5–5.0)
ALT: 30 U/L (ref 14–54)
AST: 24 U/L (ref 15–41)
Alkaline Phosphatase: 87 U/L (ref 38–126)
Anion gap: 12 (ref 5–15)
BILIRUBIN TOTAL: 0.6 mg/dL (ref 0.3–1.2)
BUN: 10 mg/dL (ref 6–20)
CO2: 25 mmol/L (ref 22–32)
CREATININE: 1.24 mg/dL — AB (ref 0.44–1.00)
Calcium: 8.4 mg/dL — ABNORMAL LOW (ref 8.9–10.3)
Chloride: 99 mmol/L — ABNORMAL LOW (ref 101–111)
GFR calc Af Amer: >60 mL/min (ref >60)
GFR calc non Af Amer: 59 mL/min — ABNORMAL LOW (ref >60)
GLUCOSE: 118 mg/dL — AB (ref 65–99)
POTASSIUM: 3 mmol/L — AB (ref 3.5–5.1)
Sodium: 136 mmol/L (ref 135–145)
TOTAL PROTEIN: 6.9 g/dL (ref 6.5–8.1)

## 2015-06-02 LAB — CBC
HCT: 35.7 % — ABNORMAL LOW (ref 36.0–46.0)
Hemoglobin: 12.3 g/dL (ref 12.0–15.0)
MCH: 32.5 pg (ref 26.0–34.0)
MCHC: 34.5 g/dL (ref 30.0–36.0)
MCV: 94.2 fL (ref 78.0–100.0)
PLATELETS: 129 10*3/uL — AB (ref 150–400)
RBC: 3.79 MIL/uL — AB (ref 3.87–5.11)
RDW: 12.9 % (ref 11.5–15.5)
WBC: 13 10*3/uL — ABNORMAL HIGH (ref 4.0–10.5)

## 2015-06-02 LAB — I-STAT CG4 LACTIC ACID, ED: Lactic Acid, Venous: 0.93 mmol/L (ref 0.5–2.0)

## 2015-06-02 LAB — MAGNESIUM: Magnesium: 1.9 mg/dL (ref 1.7–2.4)

## 2015-06-02 MED ORDER — POTASSIUM CHLORIDE CRYS ER 20 MEQ PO TBCR
40.0000 meq | EXTENDED_RELEASE_TABLET | Freq: Once | ORAL | Status: AC
  Administered 2015-06-02: 40 meq via ORAL
  Filled 2015-06-02: qty 2

## 2015-06-02 MED ORDER — METOCLOPRAMIDE HCL 5 MG/ML IJ SOLN
10.0000 mg | Freq: Once | INTRAMUSCULAR | Status: AC
  Administered 2015-06-02: 10 mg via INTRAVENOUS
  Filled 2015-06-02: qty 2

## 2015-06-02 MED ORDER — ONDANSETRON HCL 4 MG/2ML IJ SOLN: 4.0000 mg | Freq: Four times a day (QID) | INTRAMUSCULAR | Status: DC | PRN

## 2015-06-02 MED ORDER — DEXTROSE 5 % IV SOLN
2.0000 g | INTRAVENOUS | Status: DC
  Administered 2015-06-02 – 2015-06-03 (×2): 2 g via INTRAVENOUS
  Filled 2015-06-02 (×3): qty 2

## 2015-06-02 MED ORDER — DEXTROSE 5 % IV SOLN: 1.0000 g | INTRAVENOUS | Status: DC

## 2015-06-02 MED ORDER — SODIUM CHLORIDE 0.9 % IV SOLN
INTRAVENOUS | Status: AC
  Administered 2015-06-02: 06:00:00 via INTRAVENOUS

## 2015-06-02 MED ORDER — ONDANSETRON HCL 4 MG PO TABS: 4.0000 mg | ORAL_TABLET | Freq: Four times a day (QID) | ORAL | Status: DC | PRN

## 2015-06-02 MED ORDER — MORPHINE SULFATE (PF) 4 MG/ML IV SOLN
4.0000 mg | Freq: Once | INTRAVENOUS | Status: AC
  Administered 2015-06-02: 4 mg via INTRAVENOUS
  Filled 2015-06-02: qty 1

## 2015-06-02 MED ORDER — SODIUM CHLORIDE 0.9 % IV BOLUS (SEPSIS)
500.0000 mL | Freq: Once | INTRAVENOUS | Status: AC
  Administered 2015-06-02: 500 mL via INTRAVENOUS

## 2015-06-02 MED ORDER — ENOXAPARIN SODIUM 40 MG/0.4ML ~~LOC~~ SOLN
40.0000 mg | SUBCUTANEOUS | Status: DC
  Administered 2015-06-02 – 2015-06-05 (×4): 40 mg via SUBCUTANEOUS
  Filled 2015-06-02 (×4): qty 0.4

## 2015-06-02 MED ORDER — SODIUM CHLORIDE 0.9 % IV SOLN
1000.0000 mL | Freq: Once | INTRAVENOUS | Status: AC
  Administered 2015-06-02: 1000 mL via INTRAVENOUS

## 2015-06-02 MED ORDER — CEFTRIAXONE SODIUM 1 G IJ SOLR
INTRAMUSCULAR | Status: AC
  Filled 2015-06-02: qty 10

## 2015-06-02 MED ORDER — DEXTROSE 5 % IV SOLN
1.0000 g | Freq: Once | INTRAVENOUS | Status: AC
  Administered 2015-06-02: 1 g via INTRAVENOUS
  Filled 2015-06-02: qty 10

## 2015-06-02 MED ORDER — POTASSIUM CHLORIDE 10 MEQ/100ML IV SOLN
10.0000 meq | Freq: Once | INTRAVENOUS | Status: AC
  Administered 2015-06-02: 10 meq via INTRAVENOUS
  Filled 2015-06-02: qty 100

## 2015-06-02 MED ORDER — SODIUM CHLORIDE 0.9 % IV BOLUS (SEPSIS)
1000.0000 mL | Freq: Once | INTRAVENOUS | Status: AC
  Administered 2015-06-02: 1000 mL via INTRAVENOUS

## 2015-06-02 MED ORDER — SODIUM CHLORIDE 0.9 % IV SOLN: 1000.0000 mL | INTRAVENOUS | Status: DC

## 2015-06-02 MED ORDER — ACETAMINOPHEN 325 MG PO TABS: 650.0000 mg | ORAL_TABLET | Freq: Four times a day (QID) | ORAL | Status: DC | PRN

## 2015-06-02 MED ORDER — HYDROCODONE-ACETAMINOPHEN 5-325 MG PO TABS
1.0000 | ORAL_TABLET | ORAL | Status: DC | PRN
  Administered 2015-06-02 – 2015-06-03 (×5): 1 via ORAL
  Filled 2015-06-02 (×5): qty 1

## 2015-06-02 MED ORDER — DIPHENHYDRAMINE HCL 50 MG/ML IJ SOLN
25.0000 mg | Freq: Once | INTRAMUSCULAR | Status: AC
Start: 2015-06-02 — End: 2015-06-02
  Administered 2015-06-02: 25 mg via INTRAVENOUS
  Filled 2015-06-02: qty 1

## 2015-06-02 MED ORDER — ACETAMINOPHEN 650 MG RE SUPP: 650.0000 mg | Freq: Four times a day (QID) | RECTAL | Status: DC | PRN

## 2015-06-02 NOTE — ED Notes (Signed)
During obtaining second set of blood cultures pt. Demanded me to take needle out and only obtained blue tube.

## 2015-06-02 NOTE — Progress Notes (Signed)
ANTIBIOTIC CONSULT NOTE - INITIAL  Pharmacy Consult for ceftriaxone Indication: pyelonephritis with sepsis  Allergies  Allergen Reactions  . Ciprofloxacin Hives  . Penicillins Hives    Has patient had a PCN reaction causing immediate rash, facial/tongue/throat swelling, SOB or lightheadedness with hypotension: yes Has patient had a PCN reaction causing severe rash involving mucus membranes or skin necrosis: no Has patient had a PCN reaction that required hospitalization no Has patient had a PCN reaction occurring within the last 10 years: no If all of the above answers are "NO", then may proceed with Cephalosporin use.     Patient Measurements: Height: 5\' 3"  (160 cm) Weight: 165 lb (74.844 kg) IBW/kg (Calculated) : 52.4 Adjusted Body Weight:   Vital Signs: Temp: 101.4 F (38.6 C) (12/24 0559) Temp Source: Oral (12/24 0559) BP: 123/67 mmHg (12/24 0559) Pulse Rate: 117 (12/24 0559) Intake/Output from previous day: 12/23 0701 - 12/24 0700 In: 2000 [I.V.:2000] Out: 700 [Urine:700] Intake/Output from this shift: Total I/O In: 2000 [I.V.:2000] Out: 700 [Urine:700]  Labs:  Recent Labs  05/30/15 2224 06/02/15 0214  WBC 6.7 15.8*  HGB 13.2 13.3  PLT 271 142*  CREATININE 0.97 1.24*   Estimated Creatinine Clearance: 65.5 mL/min (by C-G formula based on Cr of 1.24). No results for input(s): VANCOTROUGH, VANCOPEAK, VANCORANDOM, GENTTROUGH, GENTPEAK, GENTRANDOM, TOBRATROUGH, TOBRAPEAK, TOBRARND, AMIKACINPEAK, AMIKACINTROU, AMIKACIN in the last 72 hours.   Microbiology: No results found for this or any previous visit (from the past 720 hour(s)).  Medical History: Past Medical History  Diagnosis Date  . Adenomyosis 2012    Planning hysterectomy   . 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 Asthma 1988  . Thyromegaly     H/O  . ADHD (attention deficit hyperactivity disorder)     NO MEDS  . 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    Medications:  Anti-infectives    Start      Dose/Rate Route Frequency Ordered Stop   06/02/15 2200  cefTRIAXone (ROCEPHIN) 1 g in dextrose 5 % 50 mL IVPB  Status:  Discontinued     1 g 100 mL/hr over 30 Minutes Intravenous Every 24 hours 06/02/15 0601 06/02/15 0608   06/02/15 1000  cefTRIAXone (ROCEPHIN) 2 g in dextrose 5 % 50 mL IVPB     2 g 100 mL/hr over 30 Minutes Intravenous Every 24 hours 06/02/15 0608     06/02/15 0225  cefTRIAXone (ROCEPHIN) 1 G injection  Status:  Discontinued    Comments:  May, Barbara   : cabinet override      06/02/15 0225 06/02/15 0228   06/02/15 0200  cefTRIAXone (ROCEPHIN) 1 g in dextrose 5 % 50 mL IVPB     1 g 100 mL/hr over 30 Minutes Intravenous  Once 06/02/15 0146 06/02/15 0410     Assessment: Pt with flank pain, fever, chills, nausea in ED. Pharmacy asked to dose ceftriaxone for pyelonephritis with sepsis   Goal of Therapy:  Rocephin based on manufacturer dosing recommendations.  Plan: Ceftriaxone 2gm iv q24hr Follow up culture results  Aleene DavidsonGrimsley Jr, Tamina Cyphers Crowford 06/02/2015,6:09 AM

## 2015-06-02 NOTE — H&P (Signed)
History and Physical  Patient Name: [name]     ZOX:[id number]    DOB: October 07, 1985    DOA: 06/02/2015 Referring physician: Dione Booze, MD PCP: Devota Pace, MD      Chief Complaint: Flank pain, fever, chills, nausea  HPI: [name] is a 29 y.o. female with no significant past medical history who presents with acute worsening flank pain, fever, and chills.  The patient was in her normal state of health until Monday night when she was at a restaurant with her family, had sudden urge to urinate, and felt a sharp pain radiating to the groin, staying with pain, followed by generalized malaise and emesis. After that she had colicky, severe right flank pain, like "back labor", and "worse than when I was having a baby", and so she came to the ER. In the ER, a CT renal study and transvaginal ultrasound were performed (the former showed, "There is mild right hydronephrosis with mild perinephric haziness. No calculus identified."), it appeared that she had passed a kidney stone and so she was discharged with ibuprofen and Percocet.  Since then, the patient has had continued fever, chills, flank pain, emesis, urinary urgency, and dysuria, so she returned to the ER tonight. Here she was febrile to 102.44F, tachycardic to 129 bpm, tachypneic, and hypotensive but responsive to fluids. Urinalysis again showed full field RBCs, WBCs, and casts, she had a mild AKI, hypokalemia, and leukocytosis. Blood cultures were drawn, and ceftriaxone was administered, as well as a 30 mls/kg fluid bolus. TR[facility] were asked to admit for pyelonephritis with sepsis.     Review of Systems:  Pt complains of fever, chills, right flank pain, cramps, dysuria, emesis, urinary urgency and frequency, photophobia, headache. Pt denies any cough, sputum, confusion, passing out.  All other systems negative except as just noted or noted in the history of present illness.   Allergies:  Allergies  Allergen Reactions  .  Ciprofloxacin Hives  . Penicillins Hives    Has patient had a PCN reaction causing immediate rash, facial/tongue/throat swelling, SOB or lightheadedness with hypotension: yes Has patient had a PCN reaction causing severe rash involving mucus membranes or skin necrosis: no Has patient had a PCN reaction that required hospitalization no Has patient had a PCN reaction occurring within the last 10 years: no If all of the above answers are "NO", then may proceed with Cephalosporin use.     Home medications: None Oral opioid and ibuprofen since Tuesday  Past medical history: 1. Adenomyosis 2. Asthma, intermittent 3. ADHD not on medicines  Past surgical history: 1. Tubal ligation 2. Endometrial ablation  Family history:  Mother, depression. Father, diabetes.  Social History:  Patient lives with her boyfriend and 3 children. She smokes and has been interested in quitting. Rarely uses alcohol. 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        Physical Exam: BP 109/65 mmHg  Pulse 99  Temp(Src) 102.1 F (38.9 C) (Oral)  Resp 25  Ht  (1.6 m)  Wt 74.844 kg (165 lb)  BMI 29.24 kg/m2  SpO2 97% General appearance: Well-developed, adult female, alert and in moderate distress from chills.   Eyes: Anicteric, conjunctiva pink, lids and lashes normal.     ENT: No nasal deformity, discharge, or epistaxis.  OP moist without lesions.   Lymph: No cervical, supraclavicular lymphadenopathy. Skin: Warm and dry.  Cap refill good.  Cardiac: Tachy regular, nl S1-S2, no murmurs appreciated.   Respiratory: CTAB without  rales or wheezes. Abdomen: Abdomen soft without rigidity.  Mild right sided TTP without guarding. No ascites, distension.   MSK: No deformities or effusions.  No CVA tenderness. Neuro: Sensorium intact and responding to questions, attention normal.  Speech is fluent.  Moves all extremities equally and with normal coordination.  Cranial nerves intact.  Naming  intact.  Oriented to person, place, time and situation.   Psych: Behavior appropriate.  Affect tired and in pain.  No evidence of aural or visual hallucinations or delusions.       Labs on Admission:  The metabolic panel shows hypokalemia and serum creatinine 1.24 mg/dL from a baseline around 0.8 mg/dL. The albumin is slightly low. Otherwise the transaminases and bilirubin are normal. Urinalysis shows RBCs, WBCs, ketones, and granular casts. The complete blood count shows leukocytosis and mild thrombocytopenia.   Radiological Exams on Admission: Personally reviewed: Dg Chest 2 View 06/02/2015  Clear, no focal opacities.        Assessment/Plan 1. Sepsis from pyelonephritis:  This is new.  Suspected source urine. Organism unknown. Patient meets criteria given tachycardia, tachypnea, fever, leukocytosis, and evidence of organ dysfunction (renal and thrombocytopenia).  Blood and urine cultures drawn.  Lactate normal, will not repeat.  MAP > 65 mmHg with fluids. -Continue ceftriaxone for community-acquired UTI -Follow urine and blood cultures -Sepsis bundle:  -30 ml/kg bolus completed in ED  -Acetaminophen for fever, opioid oral for pain At the time of my evaluation, the patient does not have signs of hemodynamic compromise or end-organ dysfunction and is stable for a medical-surgical bed.       2. Hypokalemia:  This is new.   -Check magnesium -Replete orally -Repeat BMP  3. Thrombocytopenia and leukocytosis:  In setting of early sepsis.   -Trend CBC  4. AKI:  In setting of early sepsis and pyelonephritis.   -Fluids and trend BMP    DVT PPx: Lovenox Diet: Regular Consultants: None Code Status: Full Family Communication: None  Medical decision making: What exists of the patient's previous chart was reviewed in depth and the case was discussed with Dr. Preston FleetingGlick. Patient seen 4:40 AM on 06/02/2015.  Disposition Plan:  Admit to inpatient for sepsis with pyelo.  IV  antibitoics and follow cultures.  Trend BMP given mild AKI and hypokalemia.      Alberteen SamChristopher P Danford [facility] Pager [phone]

## 2015-06-02 NOTE — ED Provider Notes (Signed)
CSN: [id number]     Arrival date & time 06/02/15  0117 History  By signing my name below, I, Emmanuella Mensah, attest that this documentation has been prepared under the direction and in the presence of Dione Booze, MD. Electronically Signed: Angelene Giovanni, ED Scribe. 06/02/2015. 1:51 AM.      Chief Complaint  Patient presents with  . Fever  . Migraine   The history is provided by the patient. No language interpreter was used.   HPI Comments: [name] is a 29 y.o. female with a hx of asthma who presents to the Emergency Department complaining of gradually worsening sharp and achy frontal HA onset 2 days ago. She reports associated fever, chills, n/v, decrease in appetite, photophobia, right side pain, frequency, and urgency. She adds that the right side pain could be due to having a cyst on her right ovary. She explains that the pain is worse with head movements. She reports that she was seen here on 05/30/15 when she passed a kidney stone and was sent home with Ibuprofen and Vicodin. She states that she has been compliant with those medications but the pain is still persistent. She denies any diarrhea.   She reports an allergy to Cipro and Penicillin. She states that when she took the Penicillin, she had a generalized rash within hours of taking it when she was younger. She currently reports a rash to her bilateral inner thighs and legs. She states that the rash is similar to when she had the rash with Penicillin however onset is not as immediate.    Past Medical History  Diagnosis Date  . Adenomyosis 2012    Planning hysterectomy   . 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 Asthma 1988  . Thyromegaly     H/O  . ADHD (attention deficit hyperactivity disorder)     NO MEDS  . 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 Incise and drain abcess      surgical incision, wound produced mrsa  . Endometrial ablation  2011  . Tubal ligation  2010    Family History  Problem Relation Age of Onset  . Depression Sister   . Depression Mother   . Diabetes Father   . Alzheimer's disease Maternal Grandfather   . Ovarian cancer      maternal great grandmother  . Uterine cancer      maternal great aunt  . Testicular cancer Cousin     maternal cousin   Social History  Substance Use Topics  . Smoking status: Current Every Day Smoker -- 0.50 packs/day    Types: Cigarettes  . Smokeless tobacco: Never Used  . Alcohol Use: 0.6 oz/week    1 Cans of beer per week     Comment: ocassionally   OB History    Gravida Para Term Preterm AB TAB SAB Ectopic Multiple Living   Review of Systems  Constitutional: Positive for fever, chills and appetite change.  Eyes: Positive for photophobia.  Gastrointestinal: Positive for nausea and vomiting. Negative for diarrhea.  Musculoskeletal: Positive for myalgias.  Neurological: Positive for headaches.  All other systems reviewed and are negative.     Allergies  Ciprofloxacin and Penicillins  Home Medications   Prior to Admission medications   Medication Sig Start Date End Date Taking? Authorizing Provider  albuterol (PROVENTIL HFA;VENTOLIN  HFA) 108 (90 BASE) MCG/ACT inhaler Inhale 1 puff into the lungs every 6 (six) hours as needed for wheezing or shortness of breath.    Historical Provider, MD  clindamycin (CLEOCIN) 150 MG capsule Take 3 capsules (450 mg total) by mouth 3 (three) times daily. 01/20/15   Hannah Muthersbaugh, PA-C  HYDROcodone-acetaminophen (NORCO/VICODIN) 5-325 MG tablet Take 1 tablet by mouth every 4 (four) hours as needed. 05/31/15   Cheri Fowler, PA-C  ibuprofen (ADVIL,MOTRIN) 800 MG tablet Take 1 tablet (800 mg total) by mouth 3 (three) times daily. 05/31/15   Cheri Fowler, PA-C  oxyCODONE-acetaminophen (PERCOCET/ROXICET) 5-325 MG tablet Take 1 tablet by mouth every 4 (four) hours as needed for moderate pain or severe pain.    Historical Provider, MD   predniSONE (STERAPRED UNI-PAK) 10 MG tablet Take by mouth daily. Day 1: take 6 tabs.  Day 2: 5 tabs  Day 3: 4 tabs  Day 4: 3 tabs  Day 5: 2 tabs  Day 6: 1 tab Patient not taking: Reported on 05/30/2015 02/27/14   Trixie Dredge, PA-C   BP 103/54 mmHg  Pulse 129  Temp(Src) 102.1 F (38.9 C) (Oral)  Resp 18  Ht  (1.6 m)  Wt 165 lb (74.844 kg)  BMI 29.24 kg/m2  SpO2 98% Physical Exam  Constitutional: She is oriented to person, place, and time. She appears well-developed and well-nourished. No distress.  Appears uncomfortable   HENT:  Head: Normocephalic and atraumatic.  Eyes: Conjunctivae and EOM are normal. Pupils are equal, round, and reactive to light.  Fundi are normal  Neck: Normal range of motion. Neck supple. No JVD present.  Cardiovascular: Regular rhythm and normal heart sounds.   No murmur heard. Tachycardic  Pulmonary/Chest: Effort normal and breath sounds normal. She has no wheezes. She has no rales. She exhibits no tenderness.  Abdominal: She exhibits no distension and no mass. There is tenderness. There is no rebound and no guarding.  Mild RLQ tenderness,. Moderate right CVA tenderness Bowel sounds decreased  Musculoskeletal: Normal range of motion. She exhibits no edema.  Lymphadenopathy:    She has no cervical adenopathy.  Neurological: She is alert and oriented to person, place, and time. No cranial nerve deficit. She exhibits normal muscle tone. Coordination normal.  Skin: Skin is warm and dry. No rash noted.  Psychiatric: She has a normal mood and affect.  Nursing note and vitals reviewed.   ED Course  Procedures (including critical care time) DIAGNOSTIC STUDIES: Oxygen Saturation is 98% on RA, normal by my interpretation.    COORDINATION OF CARE: 1:49 AM- Pt advised of plan for treatment and pt agrees. Pt will provide urine sample by catheter. Pt will receive morphine, benadryl, Reglan, and rocephin. Informed pt that she will need to be admitted.    Labs  Review Labs Reviewed  CULTURE, BLOOD (ROUTINE X 2)  CULTURE, BLOOD (ROUTINE X 2)  URINE CULTURE  COMPREHENSIVE METABOLIC PANEL  CBC WITH DIFFERENTIAL/PLATELET  URINALYSIS, ROUTINE W REFLEX MICROSCOPIC (NOT AT [facility])  I-STAT CG4 LACTIC ACID, ED    Imaging Review Dg Chest 2 View  06/02/2015  CLINICAL DATA:  29 year old female with headache, fever and chills. Suspected sepsis. EXAM: CHEST  2 VIEW COMPARISON:  Radiograph dated 02/27/2014 FINDINGS: The heart size and mediastinal contours are within normal limits. Both lungs are clear. The visualized skeletal structures are unremarkable. IMPRESSION: No active cardiopulmonary disease. Electronically Signed   By: Elgie Collard M.D.   On: 06/02/2015 02:39    Onalee Hua  Preston FleetingGlick, MD has personally reviewed and evaluated these images and lab results as part of his medical decision-making.  CRITICAL CARE Performed by: Dione BoozeGLICK,Ivey Cina Total critical care time: 90 minutes Critical care time was exclusive of separately billable procedures and treating other patients. Critical care was necessary to treat or prevent imminent or life-threatening deterioration. Critical care was time spent personally by me on the following activities: development of treatment plan with patient and/or surrogate as well as nursing, discussions with consultants, evaluation of patient's response to treatment, examination of patient, obtaining history from patient or surrogate, ordering and performing treatments and interventions, ordering and review of laboratory studies, ordering and review of radiographic studies, pulse oximetry and re-evaluation of patient's condition.  MDM   Final diagnoses:  Pyelonephritis  Hypokalemia  Acute kidney injury (nontraumatic) (HCC)    Fever, tachycardia, headache, flank pain. Old records are reviewed and she was in the ED 3 days ago with flank pain and CTU which suggested either recently passed kidney stone or pyelonephritis. Urinalysis at that  time had too numerous to count the PVCs and many bacteria. She also had an ovarian cyst identified on CT. Ultrasound was obtained which did not identify the cyst. Patient has gotten progressively worse since discharge from the ED. Clinical picture today is that of pyelonephritis. She is started on aggressive IV fluid therapy and is started on antibiotics for community-acquired urinary tract infection. She's given a dose of ceftriaxone. Initial lactic acid level is normal, the blood pressure has dropped while in the ED in spite of getting fluids. She will be given additional IV fluids. Symptoms are improving with hydration as well as metoclopramide and a small dose of morphine.  Blood pressure improved after additional fluids and is currently 109/65 with heart rate down to 99. Case is discussed with Dr. Maryfrances Bunnellanford of [facility] who agrees to admit the patient.   I personally performed the services described in this documentation, which was scribed in my presence. The recorded information has been reviewed and is accurate.      Dione Boozeavid Raveen Wieseler, MD 06/02/15 [phone]

## 2015-06-02 NOTE — Progress Notes (Addendum)
[facility] PROGRESS NOTE  [name] ZOX:[id number] DOB: 04/29/1986 DOA: 06/02/2015 PCP: Devota Pace, MD   SAME DAY NOTE: Pt was admitted this AM  HPI/Brief narrative 29 y.o. female with no significant past medical history who presents with acute worsening flank pain, fever, and chills. Pt was found to have UTI with sepsis on admission  Assessment/Plan: 1. Pyelonephritis with sepsis present on admission - Presented with UTI with tachycardia, tachypnea, fever, leukocytosis, and evidence of organ dysfunction (renal and thrombocytopenia).  -Blood and urine cultures drawn, pending. -Continue empiric ceftriaxone for community-acquired UTI -Leukocytosis has improved somewhat overnight. Cont to monitor  2. Hypokalemia:  -Magnesium unremarkable -Replaced  3. Thrombocytopenia and leukocytosis:  -In setting of early sepsis.  -Trend CBC  4. AKI:  -In setting of early sepsis and pyelonephritis.  -Fluids and trend BMP -Renal function is improving  Code Status: Full Family Communication: Pt in room Disposition Plan: Home when no longer septic and on PO meds   Consultants:    Procedures:    Antibiotics: Anti-infectives    Start     Dose/Rate Route Frequency Ordered Stop   06/02/15 2200  cefTRIAXone (ROCEPHIN) 1 g in dextrose 5 % 50 mL IVPB  Status:  Discontinued     1 g 100 mL/hr over 30 Minutes Intravenous Every 24 hours 06/02/15 0601 06/02/15 0608   06/02/15 1000  cefTRIAXone (ROCEPHIN) 2 g in dextrose 5 % 50 mL IVPB     2 g 100 mL/hr over 30 Minutes Intravenous Every 24 hours 06/02/15 0608     06/02/15 0225  cefTRIAXone (ROCEPHIN) 1 G injection  Status:  Discontinued    Comments:  May, Barbara   : cabinet override      06/02/15 0225 06/02/15 0228   06/02/15 0200  cefTRIAXone (ROCEPHIN) 1 g in dextrose 5 % 50 mL IVPB     1 g 100 mL/hr over 30 Minutes Intravenous  Once 06/02/15 0146 06/02/15 0410      HPI/Subjective: Still reports malaise and  chills  Objective: Filed Vitals:   06/02/15 0430 06/02/15 0445 06/02/15 0559 06/02/15 1302  BP: 113/56  123/67 109/61  Pulse: 111 115 117 110  Temp:   101.4 F (38.6 C) 99.9 F (37.7 C)  TempSrc:   Oral Oral  Resp: 23 33 21 20  Height:      Weight:      SpO2: 96% 92% 94% 99%    Intake/Output Summary (Last 24 hours) at 06/02/15 1712 Last data filed at 06/02/15 1403  Gross per 24 hour  Intake 3272.5 ml  Output   1000 ml  Net 2272.5 ml   Filed Weights   06/02/15 0123  Weight: 74.844 kg (165 lb)    Exam:   General:  Asleep, easily arousable, in nad  Cardiovascular: regular, s1, s2  Respiratory: normal resp effort, no wheezing  Abdomen: soft,nondistended  Musculoskeletal: perfused, no clubbing   Data Reviewed: Basic Metabolic Panel:  Recent Labs Lab 05/30/15 2224 06/02/15 0214 06/02/15 0641  NA 142 136 136  K 2.8* 3.0* 3.8  CL 106 99* 105  CO2 GLUCOSE 130* 118* 125*  BUN CREATININE 0.97 1.24* 1.13*  CALCIUM 9.3 8.4* 7.7*  MG  --   --  1.9   Liver Function Tests:  Recent Labs Lab 05/30/15 2224 06/02/15 0214  AST 21 24  ALT 19 30  ALKPHOS 63 87  BILITOT 0.6 0.6  PROT 7.3 6.9  ALBUMIN 4.4 3.1*  Recent Labs Lab 05/30/15 2224  LIPASE 104*   No results for input(s): AMMONIA in the last 168 hours. CBC:  Recent Labs Lab 05/30/15 2224 06/02/15 0214 06/02/15 0641  WBC 6.7 15.8* 13.0*  NEUTROABS  --  14.2*  --   HGB 13.2 13.3 12.3  HCT 38.6 38.0 35.7*  MCV 95.3 92.9 94.2  PLT 271 142* 129*   Cardiac Enzymes: No results for input(s): CKTOTAL, CKMB, CKMBINDEX, TROPONINI in the last 168 hours. BNP (last 3 results) No results for input(s): BNP in the last 8760 hours.  ProBNP (last 3 results) No results for input(s): PROBNP in the last 8760 hours.  CBG: No results for input(s): GLUCAP in the last 168 hours.  Recent Results (from the past 240 hour(s))  MRSA PCR Screening     Status: None   Collection Time:  06/02/15  5:27 AM  Result Value Ref Range Status   MRSA by PCR NEGATIVE NEGATIVE Final    Comment:        The GeneXpert MRSA Assay (FDA approved for NASAL specimens only), is one component of a comprehensive MRSA colonization surveillance program. It is not intended to diagnose MRSA infection nor to guide or monitor treatment for MRSA infections.      Studies: Dg Chest 2 View  06/02/2015  CLINICAL DATA:  29 year old female with headache, fever and chills. Suspected sepsis. EXAM: CHEST  2 VIEW COMPARISON:  Radiograph dated 02/27/2014 FINDINGS: The heart size and mediastinal contours are within normal limits. Both lungs are clear. The visualized skeletal structures are unremarkable. IMPRESSION: No active cardiopulmonary disease. Electronically Signed   By: Elgie CollardArash  Radparvar M.D.   On: 06/02/2015 02:39    Scheduled Meds: . cefTRIAXone (ROCEPHIN)  IV  2 g Intravenous Q24H  . enoxaparin (LOVENOX) injection  40 mg Subcutaneous Q24H   Continuous Infusions:   Principal Problem:   Sepsis (HCC) Active Problems:   Pyelonephritis   Thrombocytopenia (HCC)   AKI (acute kidney injury) (HCC)   Hypokalemia   CHIU, STEPHEN K  [facility] Pager [phone]. If 7PM-7AM, please contact night-coverage at [email or web address], password [email or web address] 06/02/2015, 5:12 PM  LOS: 0 days

## 2015-06-02 NOTE — Progress Notes (Signed)
PHARMACY NOTE -  Rocephin  Pharmacy has been assisting with dosing of Rocephin for UTI. Dosage remains stable at 2gm IV q24h and need for further dosage adjustment appears unlikely at present.    Will sign off at this time.  Please reconsult if a change in clinical status warrants re-evaluation of dosage.  Junita PushMichelle Elva Mauro, PharmD, BCPS Pager: [phone] 06/02/2015@1 :23 PM

## 2015-06-02 NOTE — ED Notes (Addendum)
Pt c/o HA, +photophobia.  +nausea.  +vomiting.  Pt reports pain is generalized and rates pain 10/10, sharp and throbbing in nature.  Neurologically intact.  No facial droop, grips equal b/l and speech is clear.

## 2015-06-03 DIAGNOSIS — N12 Tubulo-interstitial nephritis, not specified as acute or chronic: Secondary | ICD-10-CM

## 2015-06-03 DIAGNOSIS — E876 Hypokalemia: Secondary | ICD-10-CM

## 2015-06-03 DIAGNOSIS — D696 Thrombocytopenia, unspecified: Secondary | ICD-10-CM

## 2015-06-03 LAB — BASIC METABOLIC PANEL
Anion gap: 9 (ref 5–15)
BUN: 5 mg/dL — AB (ref 6–20)
CALCIUM: 8.4 mg/dL — AB (ref 8.9–10.3)
CO2: 26 mmol/L (ref 22–32)
CREATININE: 0.94 mg/dL (ref 0.44–1.00)
Chloride: 106 mmol/L (ref 101–111)
GFR calc Af Amer: >60 mL/min (ref >60)
GLUCOSE: 94 mg/dL (ref 65–99)
Potassium: 3.8 mmol/L (ref 3.5–5.1)
SODIUM: 141 mmol/L (ref 135–145)

## 2015-06-03 LAB — CBC
HCT: 33.9 % — ABNORMAL LOW (ref 36.0–46.0)
Hemoglobin: 11.8 g/dL — ABNORMAL LOW (ref 12.0–15.0)
MCH: 31.9 pg (ref 26.0–34.0)
MCHC: 34.8 g/dL (ref 30.0–36.0)
MCV: 91.6 fL (ref 78.0–100.0)
PLATELETS: 130 10*3/uL — AB (ref 150–400)
RBC: 3.7 MIL/uL — ABNORMAL LOW (ref 3.87–5.11)
RDW: 13.2 % (ref 11.5–15.5)
WBC: 8.6 10*3/uL (ref 4.0–10.5)

## 2015-06-03 MED ORDER — KETOROLAC TROMETHAMINE 15 MG/ML IJ SOLN
15.0000 mg | Freq: Four times a day (QID) | INTRAMUSCULAR | Status: DC | PRN
  Administered 2015-06-03 – 2015-06-04 (×3): 15 mg via INTRAVENOUS
  Filled 2015-06-03 (×4): qty 1

## 2015-06-03 MED ORDER — VANCOMYCIN HCL IN DEXTROSE 750-5 MG/150ML-% IV SOLN
750.0000 mg | Freq: Three times a day (TID) | INTRAVENOUS | Status: DC
  Administered 2015-06-03 – 2015-06-04 (×5): 750 mg via INTRAVENOUS
  Filled 2015-06-03 (×7): qty 150

## 2015-06-03 MED ORDER — DIPHENHYDRAMINE HCL 50 MG/ML IJ SOLN
25.0000 mg | Freq: Four times a day (QID) | INTRAMUSCULAR | Status: DC | PRN
Start: 2015-06-03 — End: 2015-06-05
  Administered 2015-06-03: 25 mg via INTRAVENOUS
  Filled 2015-06-03: qty 1

## 2015-06-03 NOTE — Progress Notes (Signed)
CRITICAL VALUE ALERT  Critical value received:  Blood culture  Date of notification:  06/03/15  Time of notification:  0117  Critical value read back:Yes.    Nurse who received alert:  Marilynn LatinoJ. Clark Rn  MD notified (1st page):  na  Time of first page:  na  MD notified (2nd page):na  Time of second page:  Responding MD:  na  Time MD responded:  na

## 2015-06-03 NOTE — Progress Notes (Signed)
ANTIBIOTIC CONSULT NOTE - INITIAL  Pharmacy Consult for Vancomycin Indication: Bacteremia  Allergies  Allergen Reactions  . Ciprofloxacin Hives  . Penicillins Hives    Has patient had a PCN reaction causing immediate rash, facial/tongue/throat swelling, SOB or lightheadedness with hypotension: yes Has patient had a PCN reaction causing severe rash involving mucus membranes or skin necrosis: no Has patient had a PCN reaction that required hospitalization no Has patient had a PCN reaction occurring within the last 10 years: no If all of the above answers are "NO", then may proceed with Cephalosporin use.   . Rocephin [Ceftriaxone] Rash    Patient Measurements: Height: 5\' 3"  (160 cm) Weight: 165 lb (74.844 kg) IBW/kg (Calculated) : 52.4 Adjusted Body Weight:   Vital Signs: Temp: 99.8 F (37.7 C) (12/25 0430) Temp Source: Oral (12/25 0430) BP: 113/81 mmHg (12/25 0430) Pulse Rate: 88 (12/25 0430) Intake/Output from previous day: 12/24 0701 - 12/25 0700 In: 1272.5 [I.V.:1222.5; IV Piggyback:50] Out: 1050 [Urine:1050] Intake/Output from this shift:    Labs:  Recent Labs  06/02/15 0214 06/02/15 0641 06/03/15 0413  WBC 15.8* 13.0* 8.6  HGB 13.3 12.3 11.8*  PLT 142* 129* 130*  CREATININE 1.24* 1.13* 0.94   Estimated Creatinine Clearance: 86.4 mL/min (by C-G formula based on Cr of 0.94). No results for input(s): VANCOTROUGH, VANCOPEAK, VANCORANDOM, GENTTROUGH, GENTPEAK, GENTRANDOM, TOBRATROUGH, TOBRAPEAK, TOBRARND, AMIKACINPEAK, AMIKACINTROU, AMIKACIN in the last 72 hours.   Microbiology: Recent Results (from the past 720 hour(s))  Culture, blood (routine x 2)     Status: None (Preliminary result)   Collection Time: 06/02/15  2:14 AM  Result Value Ref Range Status   Specimen Description BLOOD RIGHT ANTECUBITAL  Final   Special Requests BOTTLES DRAWN AEROBIC AND ANAEROBIC 5CC  Final   Culture  Setup Time   Final    IN BOTH AEROBIC AND ANAEROBIC BOTTLES GRAM NEGATIVE  RODS GRAM POSITIVE COCCI IN CLUSTERS IN PAIRS CRITICAL RESULT CALLED TO, READ BACK BY AND VERIFIED WITH: J.[facility] CenterCLARK,RN [id number] 06/03/15 M.CAMPBELL    Culture   Final    TOO YOUNG TO READ [facility]    Report Status PENDING  Incomplete  MRSA PCR Screening     Status: None   Collection Time: 06/02/15  5:27 AM  Result Value Ref Range Status   MRSA by PCR NEGATIVE NEGATIVE Final    Comment:        The GeneXpert MRSA Assay (FDA approved for NASAL specimens only), is one component of a comprehensive MRSA colonization surveillance program. It is not intended to diagnose MRSA infection nor to guide or monitor treatment for MRSA infections.     Medical History: Past Medical History  Diagnosis Date  . Adenomyosis 2012    Planning hysterectomy   . 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 Asthma 1988  . Thyromegaly     H/O  . ADHD (attention deficit hyperactivity disorder)     NO MEDS  . 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    Assessment: [age] yoF with no past medical history admitted with sepsis secondary to pyelonephritis and GNR bacteremia.  Patient initially treated with ceftriaxone and now vancomycin added for 1 of 2 blood cultures growing GPC in clusters and pairs.    Anti-infectives 12/24 >> Ceftriaxone  >>  12/25 >> Vancomycin  >>    Vitals/Labs WBC: Improved to WNL Tm24h: 101.5 (Tc 98.6) Renal: SCr improved to 0.94, CrCl 86 CG (N~100) Lactate WNL  Cultures 12/24 bloodx2: 1 of 2 growing GNRs, GPC in clusters, pairs 12/24 urine: collected  12/24 MRSA PCR negative  Goal of Therapy:  Vancomycin trough level 15-20 mcg/ml Eradication of infection  Plan:  Start Vancomycin  IV q8h Continue Ceftriaxone 2g IV q24h F/u renal fxn, VT at Css, micro, clinical course  Haynes Hoehn, PharmD, BCPS 06/03/2015, 3:08 PM  Pager: [phone]

## 2015-06-03 NOTE — Progress Notes (Signed)
[facility] PROGRESS NOTE  [name] NWG:[id number] DOB: 01-Oct-1985 DOA: 06/02/2015 PCP: Devota Pace, MD   SAME DAY NOTE: Pt was admitted this AM  HPI/Brief narrative 29 y.o. female with no significant past medical history who presents with acute worsening flank pain, fever, and chills. Pt was found to have UTI with sepsis on admission  Assessment/Plan: 1. Pyelonephritis and gram neg bacteremia with sepsis present on admission - Presented with UTI with tachycardia, tachypnea, fever, leukocytosis, and evidence of organ dysfunction (renal and thrombocytopenia).  -Blood cultures thus far pos for gram neg organisms -Urine culture pending. -Continue empiric ceftriaxone for community-acquired UTI and bacteremia -Leukocytosis has normalized  2. Hypokalemia:  -Magnesium unremarkable -Replaced - Remains stable  3. Thrombocytopenia and leukocytosis:  - In setting of early sepsis.  -Trend CBC  4. AKI:  -In setting of early sepsis and pyelonephritis.  -Fluids and trend BMP -Renal function has improved  Code Status: Full Family Communication: Pt in room Disposition Plan: Home when no longer septic and on PO meds   Consultants:    Procedures:    Antibiotics: Anti-infectives    Start     Dose/Rate Route Frequency Ordered Stop   06/02/15 2200  cefTRIAXone (ROCEPHIN) 1 g in dextrose 5 % 50 mL IVPB  Status:  Discontinued     1 g 100 mL/hr over 30 Minutes Intravenous Every 24 hours 06/02/15 0601 06/02/15 0608   06/02/15 1000  cefTRIAXone (ROCEPHIN) 2 g in dextrose 5 % 50 mL IVPB     2 g 100 mL/hr over 30 Minutes Intravenous Every 24 hours 06/02/15 0608     06/02/15 0225  cefTRIAXone (ROCEPHIN) 1 G injection  Status:  Discontinued    Comments:  May, Barbara   : cabinet override      06/02/15 0225 06/02/15 0228   06/02/15 0200  cefTRIAXone (ROCEPHIN) 1 g in dextrose 5 % 50 mL IVPB     1 g 100 mL/hr over 30 Minutes Intravenous  Once 06/02/15 0146 06/02/15  0410      HPI/Subjective: Complaining of headache  Objective: Filed Vitals:   06/02/15 0559 06/02/15 1302 06/02/15 2039 06/03/15 0430  BP: 123/67 109/61 106/60 113/81  Pulse: 117 110 71 88  Temp: 101.4 F (38.6 C) 99.9 F (37.7 C) 101.5 F (38.6 C) 99.8 F (37.7 C)  TempSrc: Oral Oral Oral Oral  Resp: Height:      Weight:      SpO2: 94% 99% 98% 100%    Intake/Output Summary (Last 24 hours) at 06/03/15 1140 Last data filed at 06/03/15 0423  Gross per 24 hour  Intake 1222.5 ml  Output   1050 ml  Net  172.5 ml   Filed Weights   06/02/15 0123  Weight: 74.844 kg (165 lb)    Exam:   General:  Awake, laying in bed, in nad  Cardiovascular: regular, s1, s2  Respiratory: normal resp effort, no wheezing  Abdomen: soft,nondistended  Musculoskeletal: perfused, no clubbing   Data Reviewed: Basic Metabolic Panel:  Recent Labs Lab 05/30/15 2224 06/02/15 0214 06/02/15 0641 06/03/15 0413  NA 142 136 136 141  K 2.8* 3.0* 3.8 3.8  CL 106 99* 105 106  CO2 GLUCOSE 130* 118* 125* 94  BUN 5*  CREATININE 0.97 1.24* 1.13* 0.94  CALCIUM 9.3 8.4* 7.7* 8.4*  MG  --   --  1.9  --    Liver Function Tests:  Recent  Labs Lab 05/30/15 2224 06/02/15 0214  AST 21 24  ALT 19 30  ALKPHOS 63 87  BILITOT 0.6 0.6  PROT 7.3 6.9  ALBUMIN 4.4 3.1*    Recent Labs Lab 05/30/15 2224  LIPASE 104*   No results for input(s): AMMONIA in the last 168 hours. CBC:  Recent Labs Lab 05/30/15 2224 06/02/15 0214 06/02/15 0641 06/03/15 0413  WBC 6.7 15.8* 13.0* 8.6  NEUTROABS  --  14.2*  --   --   HGB 13.2 13.3 12.3 11.8*  HCT 38.6 38.0 35.7* 33.9*  MCV 95.3 92.9 94.2 91.6  PLT 271 142* 129* 130*   Cardiac Enzymes: No results for input(s): CKTOTAL, CKMB, CKMBINDEX, TROPONINI in the last 168 hours. BNP (last 3 results) No results for input(s): BNP in the last 8760 hours.  ProBNP (last 3 results) No results for input(s): PROBNP in the  last 8760 hours.  CBG: No results for input(s): GLUCAP in the last 168 hours.  Recent Results (from the past 240 hour(s))  Culture, blood (routine x 2)     Status: None (Preliminary result)   Collection Time: 06/02/15  2:14 AM  Result Value Ref Range Status   Specimen Description BLOOD RIGHT ANTECUBITAL  Final   Special Requests BOTTLES DRAWN AEROBIC AND ANAEROBIC 5CC  Final   Culture  Setup Time   Final    IN BOTH AEROBIC AND ANAEROBIC BOTTLES GRAM NEGATIVE RODS GRAM POSITIVE COCCI IN CLUSTERS IN PAIRS CRITICAL RESULT CALLED TO, READ BACK BY AND VERIFIED WITH: J.[facility] HospitalCLARK,RN [id number] 06/03/15 M.CAMPBELL    Culture   Final    TOO YOUNG TO READ [facility]    Report Status PENDING  Incomplete  MRSA PCR Screening     Status: None   Collection Time: 06/02/15  5:27 AM  Result Value Ref Range Status   MRSA by PCR NEGATIVE NEGATIVE Final    Comment:        The GeneXpert MRSA Assay (FDA approved for NASAL specimens only), is one component of a comprehensive MRSA colonization surveillance program. It is not intended to diagnose MRSA infection nor to guide or monitor treatment for MRSA infections.      Studies: Dg Chest 2 View  06/02/2015  CLINICAL DATA:  29 year old female with headache, fever and chills. Suspected sepsis. EXAM: CHEST  2 VIEW COMPARISON:  Radiograph dated 02/27/2014 FINDINGS: The heart size and mediastinal contours are within normal limits. Both lungs are clear. The visualized skeletal structures are unremarkable. IMPRESSION: No active cardiopulmonary disease. Electronically Signed   By: Elgie CollardArash  Radparvar M.D.   On: 06/02/2015 02:39    Scheduled Meds: . cefTRIAXone (ROCEPHIN)  IV  2 g Intravenous Q24H  . enoxaparin (LOVENOX) injection  40 mg Subcutaneous Q24H   Continuous Infusions:   Principal Problem:   Sepsis (HCC) Active Problems:   Pyelonephritis   Thrombocytopenia (HCC)   AKI (acute kidney injury) (HCC)   Hypokalemia   CHIU,  STEPHEN K  [facility] Pager [phone]. If 7PM-7AM, please contact night-coverage at [email or web address], password [email or web address] 06/03/2015, 11:40 AM  LOS: 1 day

## 2015-06-04 LAB — URINE CULTURE

## 2015-06-04 MED ORDER — FLUCONAZOLE 100 MG PO TABS
150.0000 mg | ORAL_TABLET | Freq: Once | ORAL | Status: AC
  Administered 2015-06-04: 150 mg via ORAL
  Filled 2015-06-04: qty 2

## 2015-06-04 MED ORDER — DEXTROSE 5 % IV SOLN
1.0000 g | Freq: Three times a day (TID) | INTRAVENOUS | Status: DC
  Administered 2015-06-04 (×2): 1 g via INTRAVENOUS
  Filled 2015-06-04 (×4): qty 1

## 2015-06-04 NOTE — Progress Notes (Signed)
[facility] PROGRESS NOTE  [name] NGE:[id number] DOB: 07/08/1985 DOA: 06/02/2015 PCP: Devota Pace, MD   HPI/Brief narrative 29 y.o. female with no significant past medical history who presents with acute worsening flank pain, fever, and chills. Pt was found to have UTI with sepsis on admission  Assessment/Plan: 1. Pyelonephritis and gram neg bacteremia with sepsis present on admission - Initially presented with UTI with tachycardia, tachypnea, fever, leukocytosis, and evidence of organ dysfunction (renal and thrombocytopenia).  -Urine cultures thus far pos for proteus species -Blood culture pos for Proteus and GPC  -Patient was initially continued on rocephin however developed generalized rash on 12/25, thus rocephin has been d/c'd and changed to empiric aztreonam pending sensitivities -Leukocytosis has normalized  2. Hypokalemia:  -Magnesium unremarkable -Replaced - Remains stable  3. Thrombocytopenia and leukocytosis:  - In setting of early sepsis.  -Trend CBC -slowly improving  4. AKI:  -In setting of early sepsis and pyelonephritis.  -Fluids and trend BMP -Renal function has normalized  5. DVT prophylaxis -Lovenox subQ  Code Status: Full Family Communication: Pt in room Disposition Plan: Home when no longer septic and on PO meds   Consultants:    Procedures:    Antibiotics: Anti-infectives    Start     Dose/Rate Route Frequency Ordered Stop   06/04/15 1200  aztreonam (AZACTAM) 1 g in dextrose 5 % 50 mL IVPB     1 g 100 mL/hr over 30 Minutes Intravenous 3 times per day 06/04/15 1115     06/04/15 1145  fluconazole (DIFLUCAN) tablet 150 mg     150 mg Oral  Once 06/04/15 1130 06/04/15 1150   06/03/15 1600  vancomycin (VANCOCIN) IVPB 750 mg/150 ml premix     750 mg 150 mL/hr over 60 Minutes Intravenous Every 8 hours 06/03/15 1509     06/02/15 2200  cefTRIAXone (ROCEPHIN) 1 g in dextrose 5 % 50 mL IVPB  Status:  Discontinued     1  g 100 mL/hr over 30 Minutes Intravenous Every 24 hours 06/02/15 0601 06/02/15 0608   06/02/15 1000  cefTRIAXone (ROCEPHIN) 2 g in dextrose 5 % 50 mL IVPB  Status:  Discontinued     2 g 100 mL/hr over 30 Minutes Intravenous Every 24 hours 06/02/15 0608 06/04/15 1115   06/02/15 0225  cefTRIAXone (ROCEPHIN) 1 G injection  Status:  Discontinued    Comments:  May, Barbara   : cabinet override      06/02/15 0225 06/02/15 0228   06/02/15 0200  cefTRIAXone (ROCEPHIN) 1 g in dextrose 5 % 50 mL IVPB     1 g 100 mL/hr over 30 Minutes Intravenous  Once 06/02/15 0146 06/02/15 0410      HPI/Subjective: Wanting to go home  Objective: Filed Vitals:   06/03/15 0430 06/03/15 1501 06/03/15 2115 06/04/15 0456  BP: 113/81 117/57 110/63 103/66  Pulse: 88 94 80 77  Temp: 99.8 F (37.7 C) 98.6 F (37 C) 98.3 F (36.8 C) 97.3 F (36.3 C)  TempSrc: Oral Oral Oral Oral  Resp: Height:      Weight:      SpO2: 100% 99% 99% 100%    Intake/Output Summary (Last 24 hours) at 06/04/15 1222 Last data filed at 06/04/15 0501  Gross per 24 hour  Intake    780 ml  Output    900 ml  Net   -120 ml   Filed Weights   06/02/15 0123  Weight: 74.844 kg (165 lb)  Exam:   General:  Sitting in chair on phone, tearful, in nad  Cardiovascular: regular, s1, s2  Respiratory: normal resp effort, no wheezing  Abdomen: soft,nondistended  Musculoskeletal: perfused, no clubbing, no cyanosis  Data Reviewed: Basic Metabolic Panel:  Recent Labs Lab 05/30/15 2224 06/02/15 0214 06/02/15 0641 06/03/15 0413  NA 142 136 136 141  K 2.8* 3.0* 3.8 3.8  CL 106 99* 105 106  CO2 GLUCOSE 130* 118* 125* 94  BUN 5*  CREATININE 0.97 1.24* 1.13* 0.94  CALCIUM 9.3 8.4* 7.7* 8.4*  MG  --   --  1.9  --    Liver Function Tests:  Recent Labs Lab 05/30/15 2224 06/02/15 0214  AST 21 24  ALT 19 30  ALKPHOS 63 87  BILITOT 0.6 0.6  PROT 7.3 6.9  ALBUMIN 4.4 3.1*    Recent  Labs Lab 05/30/15 2224  LIPASE 104*   No results for input(s): AMMONIA in the last 168 hours. CBC:  Recent Labs Lab 05/30/15 2224 06/02/15 0214 06/02/15 0641 06/03/15 0413  WBC 6.7 15.8* 13.0* 8.6  NEUTROABS  --  14.2*  --   --   HGB 13.2 13.3 12.3 11.8*  HCT 38.6 38.0 35.7* 33.9*  MCV 95.3 92.9 94.2 91.6  PLT 271 142* 129* 130*   Cardiac Enzymes: No results for input(s): CKTOTAL, CKMB, CKMBINDEX, TROPONINI in the last 168 hours. BNP (last 3 results) No results for input(s): BNP in the last 8760 hours.  ProBNP (last 3 results) No results for input(s): PROBNP in the last 8760 hours.  CBG: No results for input(s): GLUCAP in the last 168 hours.  Recent Results (from the past 240 hour(s))  Urine culture     Status: None (Preliminary result)   Collection Time: 06/02/15  2:01 AM  Result Value Ref Range Status   Specimen Description URINE, CLEAN CATCH  Final   Special Requests NONE  Final   Culture   Final    >=100,000 COLONIES/mL PROTEUS MIRABILIS Performed at [facility]    Report Status PENDING  Incomplete  Culture, blood (routine x 2)     Status: None (Preliminary result)   Collection Time: 06/02/15  2:14 AM  Result Value Ref Range Status   Specimen Description BLOOD RIGHT ANTECUBITAL  Final   Special Requests BOTTLES DRAWN AEROBIC AND ANAEROBIC 5CC  Final   Culture  Setup Time   Final    GRAM NEGATIVE RODS IN BOTH AEROBIC AND ANAEROBIC BOTTLES GRAM POSITIVE COCCI IN PAIRS IN CLUSTERS AEROBIC BOTTLE ONLY CRITICAL RESULT CALLED TO, READ BACK BY AND VERIFIED WITH: J.[facility] 4098 06/03/15 M.CAMPBELL    Culture   Final    PROTEUS MIRABILIS GRAM POSITIVE COCCI Performed at [facility]    Report Status PENDING  Incomplete  Culture, blood (routine x 2)     Status: None (Preliminary result)   Collection Time: 06/02/15  2:51 AM  Result Value Ref Range Status   Specimen Description BLOOD RIGHT HAND  Final   Special Requests BOTTLES DRAWN AEROBIC  ONLY 1CC  Final   Culture   Final    NO GROWTH 2 DAYS Performed at [facility]    Report Status PENDING  Incomplete  MRSA PCR Screening     Status: None   Collection Time: 06/02/15  5:27 AM  Result Value Ref Range Status   MRSA by PCR NEGATIVE NEGATIVE Final    Comment:  The GeneXpert MRSA Assay (FDA approved for NASAL specimens only), is one component of a comprehensive MRSA colonization surveillance program. It is not intended to diagnose MRSA infection nor to guide or monitor treatment for MRSA infections.      Studies: No results found.  Scheduled Meds: . aztreonam  1 g Intravenous 3 times per day  . enoxaparin (LOVENOX) injection  40 mg Subcutaneous Q24H  . vancomycin  750 mg Intravenous Q8H   Continuous Infusions:   Principal Problem:   Sepsis (HCC) Active Problems:   Pyelonephritis   Thrombocytopenia (HCC)   AKI (acute kidney injury) (HCC)   Hypokalemia   Aceson Labell K  [facility] Pager [phone]. If 7PM-7AM, please contact night-coverage at [email or web address], password [email or web address] 06/04/2015, 12:22 PM  LOS: 2 days

## 2015-06-05 DIAGNOSIS — A419 Sepsis, unspecified organism: Secondary | ICD-10-CM

## 2015-06-05 DIAGNOSIS — N390 Urinary tract infection, site not specified: Secondary | ICD-10-CM

## 2015-06-05 HISTORY — DX: Urinary tract infection, site not specified: N39.0

## 2015-06-05 HISTORY — DX: Sepsis, unspecified organism: A41.9

## 2015-06-05 LAB — BASIC METABOLIC PANEL
Anion gap: 11 (ref 5–15)
BUN: 7 mg/dL (ref 6–20)
CHLORIDE: 101 mmol/L (ref 101–111)
CO2: 25 mmol/L (ref 22–32)
CREATININE: 0.74 mg/dL (ref 0.44–1.00)
Calcium: 8.5 mg/dL — ABNORMAL LOW (ref 8.9–10.3)
GFR calc Af Amer: >60 mL/min (ref >60)
GFR calc non Af Amer: >60 mL/min (ref >60)
Glucose, Bld: 91 mg/dL (ref 65–99)
POTASSIUM: 3.3 mmol/L — AB (ref 3.5–5.1)
SODIUM: 137 mmol/L (ref 135–145)

## 2015-06-05 LAB — CULTURE, BLOOD (ROUTINE X 2)

## 2015-06-05 LAB — CBC
HEMATOCRIT: 33.8 % — AB (ref 36.0–46.0)
HEMOGLOBIN: 12.1 g/dL (ref 12.0–15.0)
MCH: 32.5 pg (ref 26.0–34.0)
MCHC: 35.8 g/dL (ref 30.0–36.0)
MCV: 90.9 fL (ref 78.0–100.0)
Platelets: 196 10*3/uL (ref 150–400)
RBC: 3.72 MIL/uL — AB (ref 3.87–5.11)
RDW: 13.1 % (ref 11.5–15.5)
WBC: 10.1 10*3/uL (ref 4.0–10.5)

## 2015-06-05 MED ORDER — POTASSIUM CHLORIDE CRYS ER 20 MEQ PO TBCR
40.0000 meq | EXTENDED_RELEASE_TABLET | Freq: Once | ORAL | Status: AC
  Administered 2015-06-05: 40 meq via ORAL
  Filled 2015-06-05: qty 2

## 2015-06-05 MED ORDER — SULFAMETHOXAZOLE-TRIMETHOPRIM 800-160 MG PO TABS: 1.0000 | ORAL_TABLET | Freq: Two times a day (BID) | ORAL | Status: DC

## 2015-06-05 MED ORDER — SULFAMETHOXAZOLE-TRIMETHOPRIM 800-160 MG PO TABS
1.0000 | ORAL_TABLET | Freq: Two times a day (BID) | ORAL | Status: DC
  Administered 2015-06-05: 1 via ORAL
  Filled 2015-06-05: qty 1

## 2015-06-05 MED ORDER — FLUCONAZOLE 150 MG PO TABS
150.0000 mg | ORAL_TABLET | Freq: Once | ORAL | Status: AC
  Administered 2015-06-05: 150 mg via ORAL
  Filled 2015-06-05: qty 1

## 2015-06-05 NOTE — Progress Notes (Addendum)
Pharmacy Antibiotic Follow-up Note  [name] is a 29 y.o. year-old female admitted on 06/02/2015.  The patient is currently on day #2 vanco/D#4 effective antibiotics for proteus of for pyelonephritis with bacteremia.  Pharmacy now asked to change to PO TMP/SMZ  Assessment/Plan:  TMP/SMZ DS 1 tab PO BID  Temp (24hrs), Avg:98.9 F (37.2 C), Min:98.7 F (37.1 C), Max:99.2 F (37.3 C)   Recent Labs Lab 05/30/15 2224 06/02/15 0214 06/02/15 0641 06/03/15 0413 06/05/15 0441  WBC 6.7 15.8* 13.0* 8.6 10.1    Recent Labs Lab 05/30/15 2224 06/02/15 0214 06/02/15 0641 06/03/15 0413 06/05/15 0441  CREATININE 0.97 1.24* 1.13* 0.94 0.74   Estimated Creatinine Clearance: 101.5 mL/min (by C-G formula based on Cr of 0.74).    Allergies  Allergen Reactions  . Ciprofloxacin Hives  . Penicillins Hives    Has patient had a PCN reaction causing immediate rash, facial/tongue/throat swelling, SOB or lightheadedness with hypotension: yes Has patient had a PCN reaction causing severe rash involving mucus membranes or skin necrosis: no Has patient had a PCN reaction that required hospitalization no Has patient had a PCN reaction occurring within the last 10 years: no If all of the above answers are "NO", then may proceed with Cephalosporin use.   . Rocephin [Ceftriaxone] Rash    Antimicrobials this admission: 12/24 >> Ceftriaxone >> 12/26  12/25 >> Vancomycin >> 12/27  12/26 >> aztreonam >> 12/27 12/27 >> TMP/SMZ >>   Levels/dose changes this admission:  Microbiology results: 12/24 bloodx2: 1/2 proteus mirabilis (R only to NTF), CoNS  12/24 urine: proteus mirabilis (R only to NTF)  12/24 MRSA PCR negative   Thank you for allowing pharmacy to be a part of this patient's care.  Juliette Alcideustin Devaney Segers, PharmD, BCPS.   Pager: [phone] 06/05/2015 8:05 AM

## 2015-06-05 NOTE — Discharge Summary (Addendum)
Physician Discharge Summary  Alease FrameKimberly Wollen ZOX:[id number] DOB: 1986-02-11 DOA: 06/02/2015  PCP: Devota Pacealeb Melancon, MD  Admit date: 06/02/2015 Discharge date: 06/05/2015  Time spent: 20 minutes  Recommendations for Outpatient Follow-up:  1. Follow up with PCP in 1-2 weeks   Discharge Diagnoses:  Principal Problem:   Sepsis secondary to UTI [facility](HCC) Active Problems:   Pyelonephritis   Sepsis (HCC)   Thrombocytopenia (HCC)   AKI (acute kidney injury) (HCC)   Hypokalemia   Discharge Condition: Improved  Diet recommendation: Regular  Filed Weights   06/02/15 0123  Weight: 74.844 kg (165 lb)    History of present illness:  Please review dictated H and P from 12/24 for details. Briefly, 29 y.o. female with no significant past medical history who presents with acute worsening flank pain, fever, and chills. Pt was found to have UTI with sepsis on admission  Hospital Course:  1. Pyelonephritis and gram neg bacteremia with sepsis present on admission - Initially presented with UTI with tachycardia, tachypnea, fever, leukocytosis, and evidence of organ dysfunction (renal and thrombocytopenia).  -Blood and Urine cultures pos for proteus species sensitive to bactrim -Patient was initially continued on rocephin however developed generalized rash on 12/25, thus rocephin has been d/c'd and later changed to empiric aztreonam pending sensitivities -Leukocytosis has since normalized - Of note, pt did have Coag neg staph in 1/2 blood culture, likely contaminant -Patient will complete course of bactrim on discharge  2. Hypokalemia:  -Magnesium unremarkable -Replaced - Remains stable  3. Thrombocytopenia and leukocytosis:  - In setting of early sepsis.  -Trend CBC -improved  4. AKI:  -In setting of early sepsis and pyelonephritis.  -Fluids and trend BMP -Renal function has normalized  5. DVT prophylaxis -Lovenox subQ  6. Hypokalemia - Replace  Discharge Exam: Filed  Vitals:   06/04/15 1312 06/04/15 2214 06/05/15 0621 06/05/15 1334  BP: 112/63 111/68 102/63 125/67  Pulse: 88 84 65 84  Temp: 99.2 F (37.3 C) 98.8 F (37.1 C) 98.7 F (37.1 C) 98.7 F (37.1 C)  TempSrc: Oral Oral Oral Oral  Resp: 16 20 20 16   Height:      Weight:      SpO2: 100% 100% 100% 100%    General: awake, in nad Cardiovascular: regular, s1, s2 Respiratory: normal resp effort, no wheezing  Discharge Instructions     Medication List    STOP taking these medications        oxyCODONE-acetaminophen 5-325 MG tablet  Commonly known as:  PERCOCET/ROXICET      TAKE these medications        albuterol 108 (90 BASE) MCG/ACT inhaler  Commonly known as:  PROVENTIL HFA;VENTOLIN HFA  Inhale 1 puff into the lungs every 6 (six) hours as needed for wheezing or shortness of breath.     HYDROcodone-acetaminophen 5-325 MG tablet  Commonly known as:  NORCO/VICODIN  Take 1 tablet by mouth every 4 (four) hours as needed.     ibuprofen 800 MG tablet  Commonly known as:  ADVIL,MOTRIN  Take 1 tablet (800 mg total) by mouth 3 (three) times daily.     sulfamethoxazole-trimethoprim 800-160 MG tablet  Commonly known as:  BACTRIM DS,SEPTRA DS  Take 1 tablet by mouth every 12 (twelve) hours.       Allergies  Allergen Reactions  . Ciprofloxacin Hives  . Penicillins Hives    Has patient had a PCN reaction causing immediate rash, facial/tongue/throat swelling, SOB or lightheadedness with hypotension: yes Has patient had a PCN reaction  causing severe rash involving mucus membranes or skin necrosis: no Has patient had a PCN reaction that required hospitalization no Has patient had a PCN reaction occurring within the last 10 years: no If all of the above answers are "NO", then may proceed with Cephalosporin use.   . Rocephin [Ceftriaxone] Rash   Follow-up Information    Follow up with Devota Pace, MD. Schedule an appointment as soon as possible for a visit in 1 week.   Specialty:   Family Medicine   Why:  Hospital follow up   Contact information:   [address] [place] [address] [phone]        The results of significant diagnostics from this hospitalization (including imaging, microbiology, ancillary and laboratory) are listed below for reference.    Significant Diagnostic Studies: Dg Chest 2 View  06/02/2015  CLINICAL DATA:  29 year old female with headache, fever and chills. Suspected sepsis. EXAM: CHEST  2 VIEW COMPARISON:  Radiograph dated 02/27/2014 FINDINGS: The heart size and mediastinal contours are within normal limits. Both lungs are clear. The visualized skeletal structures are unremarkable. IMPRESSION: No active cardiopulmonary disease. Electronically Signed   By: Elgie Collard M.D.   On: 06/02/2015 02:39   US Transvaginal Non-ob  05/31/2015  CLINICAL DATA:  Right lower quadrant pain.  Rule out torsion EXAM: TRANSABDOMINAL AND TRANSVAGINAL ULTRASOUND OF PELVIS DOPPLER ULTRASOUND OF OVARIES TECHNIQUE: Both transabdominal and transvaginal ultrasound examinations of the pelvis were performed. Transabdominal technique was performed for global imaging of the pelvis including uterus, ovaries, adnexal regions, and pelvic cul-de-sac. It was necessary to proceed with endovaginal exam following the transabdominal exam to visualize the ovaries. Color and duplex Doppler ultrasound was utilized to evaluate blood flow to the ovaries. COMPARISON:  None. FINDINGS: Uterus Measurements: 8 x 5 x 6 cm. No fibroids or other mass visualized. Endometrium Thickness: 9 mm. No focal abnormality visualized. Minimal fluid that is considered insignificant. Right ovary Measurements: 35 x 23 x 28 mm. Normal appearance/no adnexal mass. Left ovary Measurements: 33 x 14 x 22 mm. Normal appearance/no adnexal mass. Pulsed Doppler evaluation of both ovaries demonstrates normal low-resistance arterial and venous waveforms. Other findings No abnormal free fluid. IMPRESSION: Normal  pelvic ultrasound. Electronically Signed   By: Marnee Spring M.D.   On: 05/31/2015 02:21   US Pelvis Complete  05/31/2015  CLINICAL DATA:  Right lower quadrant pain.  Rule out torsion EXAM: TRANSABDOMINAL AND TRANSVAGINAL ULTRASOUND OF PELVIS DOPPLER ULTRASOUND OF OVARIES TECHNIQUE: Both transabdominal and transvaginal ultrasound examinations of the pelvis were performed. Transabdominal technique was performed for global imaging of the pelvis including uterus, ovaries, adnexal regions, and pelvic cul-de-sac. It was necessary to proceed with endovaginal exam following the transabdominal exam to visualize the ovaries. Color and duplex Doppler ultrasound was utilized to evaluate blood flow to the ovaries. COMPARISON:  None. FINDINGS: Uterus Measurements: 8 x 5 x 6 cm. No fibroids or other mass visualized. Endometrium Thickness: 9 mm. No focal abnormality visualized. Minimal fluid that is considered insignificant. Right ovary Measurements: 35 x 23 x 28 mm. Normal appearance/no adnexal mass. Left ovary Measurements: 33 x 14 x 22 mm. Normal appearance/no adnexal mass. Pulsed Doppler evaluation of both ovaries demonstrates normal low-resistance arterial and venous waveforms. Other findings No abnormal free fluid. IMPRESSION: Normal pelvic ultrasound. Electronically Signed   By: Marnee Spring M.D.   On: 05/31/2015 02:21   [place] Art/ven Flow Abd Pelv Doppler  05/31/2015  CLINICAL DATA:  Right lower quadrant pain.  Rule out  torsion EXAM: TRANSABDOMINAL AND TRANSVAGINAL ULTRASOUND OF PELVIS DOPPLER ULTRASOUND OF OVARIES TECHNIQUE: Both transabdominal and transvaginal ultrasound examinations of the pelvis were performed. Transabdominal technique was performed for global imaging of the pelvis including uterus, ovaries, adnexal regions, and pelvic cul-de-sac. It was necessary to proceed with endovaginal exam following the transabdominal exam to visualize the ovaries. Color and duplex Doppler ultrasound was utilized to  evaluate blood flow to the ovaries. COMPARISON:  None. FINDINGS: Uterus Measurements: 8 x 5 x 6 cm. No fibroids or other mass visualized. Endometrium Thickness: 9 mm. No focal abnormality visualized. Minimal fluid that is considered insignificant. Right ovary Measurements: 35 x 23 x 28 mm. Normal appearance/no adnexal mass. Left ovary Measurements: 33 x 14 x 22 mm. Normal appearance/no adnexal mass. Pulsed Doppler evaluation of both ovaries demonstrates normal low-resistance arterial and venous waveforms. Other findings No abnormal free fluid. IMPRESSION: Normal pelvic ultrasound. Electronically Signed   By: Marnee Spring M.D.   On: 05/31/2015 02:21   Ct Renal Stone Study  05/30/2015  CLINICAL DATA:  29 year old female with right lower quadrant abdominal pain, nausea and vomiting. EXAM: CT ABDOMEN AND PELVIS WITHOUT CONTRAST TECHNIQUE: Multidetector CT imaging of the abdomen and pelvis was performed following the standard protocol without IV contrast. COMPARISON:  Radiograph dated 02/28 08/07/2011 FINDINGS: Evaluation of this exam is limited in the absence of intravenous contrast. A marker The visualized lung bases are clear. No intra-abdominal free air or free fluid. The liver, gallbladder, pancreas, spleen, adrenal glands, left kidney and left ureter, and urinary bladder appear unremarkable. There is mild right hydronephrosis with mild perinephric haziness. No calculus identified. Findings may be related to a recently passed right renal calculus versus pyelonephritis. Correlation with urinalysis recommended. No fluid collection identified. The uterus is anteverted. A 1.8 cm right ovarian dominant follicle/cyst noted. There is moderate stool within the rectosigmoid. No evidence of bowel obstruction or inflammation. Normal appendix. There is a small hiatal hernia. The abdominal aorta and IVC appear grossly unremarkable on this noncontrast study. No portal venous gas identified. There is no adenopathy. The  abdominal wall soft tissues appear unremarkable. There bilateral L5 pars defects with grade 1 L5-S1 anterolisthesis. No acute fracture. IMPRESSION: Mild right hydronephrosis likely related to a recently passed right renal calculus versus pyelonephritis. Correlation with urinalysis mm. No renal stone identified. No evidence of bowel obstruction or inflammation.  Normal appendix. A 1.8 cm right ovarian dominant follicle/cyst. Electronically Signed   By: Elgie Collard M.D.   On: 05/30/2015 23:51    Microbiology: Recent Results (from the past 240 hour(s))  Urine culture     Status: None   Collection Time: 06/02/15  2:01 AM  Result Value Ref Range Status   Specimen Description URINE, CLEAN CATCH  Final   Special Requests NONE  Final   Culture   Final    >=100,000 COLONIES/mL PROTEUS MIRABILIS Performed at [facility]    Report Status 06/04/2015 FINAL  Final   Organism ID, Bacteria PROTEUS MIRABILIS  Final      Susceptibility   Proteus mirabilis - MIC*    AMPICILLIN <=2 SENSITIVE Sensitive     CEFAZOLIN <=4 SENSITIVE Sensitive     CEFTRIAXONE <=1 SENSITIVE Sensitive     CIPROFLOXACIN <=0.25 SENSITIVE Sensitive     GENTAMICIN <=1 SENSITIVE Sensitive     IMIPENEM 4 SENSITIVE Sensitive     NITROFURANTOIN 128 RESISTANT Resistant     TRIMETH/SULFA <=20 SENSITIVE Sensitive     AMPICILLIN/SULBACTAM <=2 SENSITIVE Sensitive  PIP/TAZO <=4 SENSITIVE Sensitive     * >=100,000 COLONIES/mL PROTEUS MIRABILIS  Culture, blood (routine x 2)     Status: None   Collection Time: 06/02/15  2:14 AM  Result Value Ref Range Status   Specimen Description BLOOD RIGHT ANTECUBITAL  Final   Special Requests BOTTLES DRAWN AEROBIC AND ANAEROBIC 5CC  Final   Culture  Setup Time   Final    GRAM NEGATIVE RODS IN BOTH AEROBIC AND ANAEROBIC BOTTLES GRAM POSITIVE COCCI IN PAIRS IN CLUSTERS AEROBIC BOTTLE ONLY CRITICAL RESULT CALLED TO, READ BACK BY AND VERIFIED WITH: J.[facility] 1610 06/03/15 M.CAMPBELL     Culture   Final    PROTEUS MIRABILIS STAPHYLOCOCCUS SPECIES (COAGULASE NEGATIVE) THE SIGNIFICANCE OF ISOLATING THIS ORGANISM FROM A SINGLE SET OF BLOOD CULTURES WHEN MULTIPLE SETS ARE DRAWN IS UNCERTAIN. PLEASE NOTIFY THE MICROBIOLOGY DEPARTMENT WITHIN ONE WEEK IF SPECIATION AND SENSITIVITIES ARE REQUIRED. Performed at [facility]    Report Status 06/05/2015 FINAL  Final   Organism ID, Bacteria PROTEUS MIRABILIS  Final      Susceptibility   Proteus mirabilis - MIC*    AMPICILLIN <=2 SENSITIVE Sensitive     CEFAZOLIN <=4 SENSITIVE Sensitive     CEFEPIME <=1 SENSITIVE Sensitive     CEFTAZIDIME <=1 SENSITIVE Sensitive     CEFTRIAXONE <=1 SENSITIVE Sensitive     CIPROFLOXACIN <=0.25 SENSITIVE Sensitive     GENTAMICIN <=1 SENSITIVE Sensitive     IMIPENEM 4 SENSITIVE Sensitive     TRIMETH/SULFA <=20 SENSITIVE Sensitive     AMPICILLIN/SULBACTAM <=2 SENSITIVE Sensitive     PIP/TAZO <=4 SENSITIVE Sensitive     * PROTEUS MIRABILIS  Culture, blood (routine x 2)     Status: None (Preliminary result)   Collection Time: 06/02/15  2:51 AM  Result Value Ref Range Status   Specimen Description BLOOD RIGHT HAND  Final   Special Requests BOTTLES DRAWN AEROBIC ONLY 1CC  Final   Culture   Final    NO GROWTH 3 DAYS Performed at [facility]    Report Status PENDING  Incomplete  MRSA PCR Screening     Status: None   Collection Time: 06/02/15  5:27 AM  Result Value Ref Range Status   MRSA by PCR NEGATIVE NEGATIVE Final    Comment:        The GeneXpert MRSA Assay (FDA approved for NASAL specimens only), is one component of a comprehensive MRSA colonization surveillance program. It is not intended to diagnose MRSA infection nor to guide or monitor treatment for MRSA infections.      Labs: Basic Metabolic Panel:  Recent Labs Lab 05/30/15 2224 06/02/15 0214 06/02/15 0641 06/03/15 0413 06/05/15 0441  NA 142 136 136 141 137  K 2.8* 3.0* 3.8 3.8 3.3*  CL 106 99* 105 106  101  CO2 GLUCOSE 130* 118* 125* 94 91  BUN 5* 7  CREATININE 0.97 1.24* 1.13* 0.94 0.74  CALCIUM 9.3 8.4* 7.7* 8.4* 8.5*  MG  --   --  1.9  --   --    Liver Function Tests:  Recent Labs Lab 05/30/15 2224 06/02/15 0214  AST 21 24  ALT 19 30  ALKPHOS 63 87  BILITOT 0.6 0.6  PROT 7.3 6.9  ALBUMIN 4.4 3.1*    Recent Labs Lab 05/30/15 2224  LIPASE 104*   No results for input(s): AMMONIA in the last 168 hours. CBC:  Recent Labs Lab 05/30/15  2224 06/02/15 0214 06/02/15 0641 06/03/15 0413 06/05/15 0441  WBC 6.7 15.8* 13.0* 8.6 10.1  NEUTROABS  --  14.2*  --   --   --   HGB 13.2 13.3 12.3 11.8* 12.1  HCT 38.6 38.0 35.7* 33.9* 33.8*  MCV 95.3 92.9 94.2 91.6 90.9  PLT 271 142* 129* 130* 196   Cardiac Enzymes: No results for input(s): CKTOTAL, CKMB, CKMBINDEX, TROPONINI in the last 168 hours. BNP: BNP (last 3 results) No results for input(s): BNP in the last 8760 hours.  ProBNP (last 3 results) No results for input(s): PROBNP in the last 8760 hours.  CBG: No results for input(s): GLUCAP in the last 168 hours.   Signed:  CHIU, Scheryl Marten  [facility] 06/05/2015, 2:47 PM

## 2015-06-05 NOTE — Progress Notes (Signed)
Able to assessed the vein. Attempted 3x, unable to thread the wire, patient clotted easily. RN made aware.

## 2015-06-07 LAB — CULTURE, BLOOD (ROUTINE X 2): CULTURE: NO GROWTH

## 2015-06-12 ENCOUNTER — Encounter (HOSPITAL_COMMUNITY): Payer: Self-pay | Admitting: Emergency Medicine

## 2015-06-12 ENCOUNTER — Emergency Department (HOSPITAL_COMMUNITY)
Admission: EM | Admit: 2015-06-12 | Discharge: 2015-06-12 | Disposition: A | Discharge status: Home / Self Care | Payer: Medicaid Other | Attending: Emergency Medicine | Admitting: Emergency Medicine

## 2015-06-12 DIAGNOSIS — Z8619 Personal history of other infectious and parasitic diseases: Secondary | ICD-10-CM | POA: Insufficient documentation

## 2015-06-12 DIAGNOSIS — Z8659 Personal history of other mental and behavioral disorders: Secondary | ICD-10-CM | POA: Diagnosis not present

## 2015-06-12 DIAGNOSIS — Z8742 Personal history of other diseases of the female genital tract: Secondary | ICD-10-CM | POA: Insufficient documentation

## 2015-06-12 DIAGNOSIS — R110 Nausea: Secondary | ICD-10-CM | POA: Diagnosis not present

## 2015-06-12 DIAGNOSIS — Z880 Allergy status to penicillin: Secondary | ICD-10-CM | POA: Diagnosis not present

## 2015-06-12 DIAGNOSIS — Z9851 Tubal ligation status: Secondary | ICD-10-CM | POA: Insufficient documentation

## 2015-06-12 DIAGNOSIS — Z792 Long term (current) use of antibiotics: Secondary | ICD-10-CM | POA: Insufficient documentation

## 2015-06-12 DIAGNOSIS — Z79899 Other long term (current) drug therapy: Secondary | ICD-10-CM | POA: Insufficient documentation

## 2015-06-12 DIAGNOSIS — Z8639 Personal history of other endocrine, nutritional and metabolic disease: Secondary | ICD-10-CM | POA: Insufficient documentation

## 2015-06-12 DIAGNOSIS — J45909 Unspecified asthma, uncomplicated: Secondary | ICD-10-CM | POA: Insufficient documentation

## 2015-06-12 DIAGNOSIS — Z3202 Encounter for pregnancy test, result negative: Secondary | ICD-10-CM | POA: Insufficient documentation

## 2015-06-12 DIAGNOSIS — Z87448 Personal history of other diseases of urinary system: Secondary | ICD-10-CM | POA: Insufficient documentation

## 2015-06-12 DIAGNOSIS — F17210 Nicotine dependence, cigarettes, uncomplicated: Secondary | ICD-10-CM | POA: Diagnosis not present

## 2015-06-12 DIAGNOSIS — R109 Unspecified abdominal pain: Secondary | ICD-10-CM | POA: Insufficient documentation

## 2015-06-12 LAB — COMPREHENSIVE METABOLIC PANEL
ALBUMIN: 4.3 g/dL (ref 3.5–5.0)
ALK PHOS: 66 U/L (ref 38–126)
ALT: 26 U/L (ref 14–54)
AST: 17 U/L (ref 15–41)
Anion gap: 11 (ref 5–15)
BILIRUBIN TOTAL: 0.4 mg/dL (ref 0.3–1.2)
BUN: 12 mg/dL (ref 6–20)
CALCIUM: 10.1 mg/dL (ref 8.9–10.3)
CO2: 27 mmol/L (ref 22–32)
CREATININE: 1.24 mg/dL — AB (ref 0.44–1.00)
Chloride: 101 mmol/L (ref 101–111)
GFR calc Af Amer: >60 mL/min (ref >60)
GFR calc non Af Amer: 59 mL/min — ABNORMAL LOW (ref >60)
GLUCOSE: 107 mg/dL — AB (ref 65–99)
Potassium: 4.4 mmol/L (ref 3.5–5.1)
SODIUM: 139 mmol/L (ref 135–145)
TOTAL PROTEIN: 9.2 g/dL — AB (ref 6.5–8.1)

## 2015-06-12 LAB — CBC
HCT: 42.5 % (ref 36.0–46.0)
Hemoglobin: 14.6 g/dL (ref 12.0–15.0)
MCH: 32.5 pg (ref 26.0–34.0)
MCHC: 34.4 g/dL (ref 30.0–36.0)
MCV: 94.7 fL (ref 78.0–100.0)
PLATELETS: 788 10*3/uL — AB (ref 150–400)
RBC: 4.49 MIL/uL (ref 3.87–5.11)
RDW: 13.2 % (ref 11.5–15.5)
WBC: 9.3 10*3/uL (ref 4.0–10.5)

## 2015-06-12 LAB — URINE MICROSCOPIC-ADD ON

## 2015-06-12 LAB — URINALYSIS, ROUTINE W REFLEX MICROSCOPIC
BILIRUBIN URINE: NEGATIVE
Glucose, UA: NEGATIVE mg/dL
KETONES UR: NEGATIVE mg/dL
NITRITE: NEGATIVE
PROTEIN: NEGATIVE mg/dL
Specific Gravity, Urine: 1.018 (ref 1.005–1.030)
pH: 6.5 (ref 5.0–8.0)

## 2015-06-12 LAB — LIPASE, BLOOD: Lipase: 24 U/L (ref 11–51)

## 2015-06-12 LAB — PREGNANCY, URINE: PREG TEST UR: NEGATIVE

## 2015-06-12 MED ORDER — ONDANSETRON HCL 4 MG/2ML IJ SOLN
4.0000 mg | Freq: Once | INTRAMUSCULAR | Status: DC
  Filled 2015-06-12: qty 2

## 2015-06-12 MED ORDER — SODIUM CHLORIDE 0.9 % IV BOLUS (SEPSIS)
1000.0000 mL | Freq: Once | INTRAVENOUS | Status: AC
  Administered 2015-06-12: 1000 mL via INTRAVENOUS

## 2015-06-12 MED ORDER — ONDANSETRON HCL 4 MG PO TABS: 4.0000 mg | ORAL_TABLET | Freq: Four times a day (QID) | ORAL | Status: DC

## 2015-06-12 MED ORDER — DEXTROSE 5 % IV SOLN: 1.0000 g | Freq: Once | INTRAVENOUS | Status: DC

## 2015-06-12 NOTE — Discharge Instructions (Signed)
Nausea and Vomiting  Nausea means you feel sick to your stomach. Throwing up (vomiting) is a reflex where stomach contents come out of your mouth.  HOME CARE   · Take medicine as told by your doctor.  · Do not force yourself to eat. However, you do need to drink fluids.  · If you feel like eating, eat a normal diet as told by your doctor.    Eat rice, wheat, potatoes, bread, lean meats, yogurt, fruits, and vegetables.    Avoid high-fat foods.  · Drink enough fluids to keep your pee (urine) clear or pale yellow.  · Ask your doctor how to replace body fluid losses (rehydrate). Signs of body fluid loss (dehydration) include:    Feeling very thirsty.    Dry lips and mouth.    Feeling dizzy.    Dark pee.    Peeing less than normal.    Feeling confused.    Fast breathing or heart rate.  GET HELP RIGHT AWAY IF:   · You have blood in your throw up.  · You have black or bloody poop (stool).  · You have a bad headache or stiff neck.  · You feel confused.  · You have bad belly (abdominal) pain.  · You have chest pain or trouble breathing.  · You do not pee at least once every 8 hours.  · You have cold, clammy skin.  · You keep throwing up after 24 to 48 hours.  · You have a fever.  MAKE SURE YOU:   · Understand these instructions.  · Will watch your condition.  · Will get help right away if you are not doing well or get worse.     This information is not intended to replace advice given to you by your health care provider. Make sure you discuss any questions you have with your health care provider.     Document Released: 11/12/2007 Document Revised: 08/18/2011 Document Reviewed: 10/25/2010  Elsevier Interactive Patient Education ©2016 [employer] Inc[employer]

## 2015-06-12 NOTE — ED Provider Notes (Signed)
CSN: [id number]     Arrival date & time 06/12/15  1353 History   First MD Initiated Contact with Patient 06/12/15 1707     Chief Complaint  Patient presents with  . Abdominal Pain  . Nausea      HPI Patient presents for N/V, chills x2 days. Recently admitted for acute kidney injury. Also c/o left sided abdominal pain. Rates pain 9/10. Denies diarrhea. Patient presents with subway sandwich in triage, states she took a bite before coming in and had episode of emesis.  Past Medical History  Diagnosis Date  . Adenomyosis 2012    Planning hysterectomy   . 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 Asthma 1988  . Thyromegaly     H/O  . ADHD (attention deficit hyperactivity disorder)     NO MEDS  . 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 Incise and drain abcess      surgical incision, wound produced mrsa  . Endometrial ablation  2011  . Tubal ligation  2010   Family History  Problem Relation Age of Onset  . Depression Sister   . Depression Mother   . Diabetes Father   . Alzheimer's disease Maternal Grandfather   . Ovarian cancer      maternal great grandmother  . Uterine cancer      maternal great aunt  . Testicular cancer Cousin     maternal cousin   Social History  Substance Use Topics  . Smoking status: Current Every Day Smoker -- 0.50 packs/day    Types: Cigarettes  . Smokeless tobacco: Never Used  . Alcohol Use: 0.6 oz/week    1 Cans of beer per week     Comment: ocassionally   OB History    Gravida Para Term Preterm AB TAB SAB Ectopic Multiple Living   3 3 3       3      Review of Systems  Constitutional: Negative for fever and chills.   All other systems reviewed and are negative   Allergies  Ciprofloxacin; Penicillins; and Rocephin  Home Medications   Prior to Admission medications   Medication Sig Start Date End Date Taking? Authorizing Provider  albuterol (PROVENTIL HFA;VENTOLIN HFA) 108 (90 BASE) MCG/ACT  inhaler Inhale 1 puff into the lungs every 6 (six) hours as needed for wheezing or shortness of breath.   Yes Historical Provider, MD  sulfamethoxazole-trimethoprim (BACTRIM DS,SEPTRA DS) 800-160 MG tablet Take 1 tablet by mouth every 12 (twelve) hours. 06/05/15  Yes Jerald Kief, MD  ondansetron (ZOFRAN) 4 MG tablet Take 1 tablet (4 mg total) by mouth every 6 (six) hours. 06/12/15   Nelva Nay, MD   BP 104/62 mmHg  Pulse 80  Temp(Src) 98.3 F (36.8 C) (Oral)  Resp 18  SpO2 100% Physical Exam Physical Exam  Nursing note and vitals reviewed. Constitutional: She is oriented to person, place, and time. She appears well-developed and well-nourished. No distress.  HENT:  Head: Normocephalic and atraumatic.  Eyes: Pupils are equal, round, and reactive to light.  Neck: Normal range of motion.  Cardiovascular: Normal rate and intact distal pulses.   Pulmonary/Chest: No respiratory distress.  Abdominal: Normal appearance. She exhibits no distension.  No tenderness to palpation.  Active bowel sounds.   Musculoskeletal: Normal range of motion.  Neurological: She is alert and oriented to person, place, and time. No cranial nerve deficit.  Skin: Skin is  warm and dry. No rash noted.    ED Course  Procedures (including critical care time) Medications  sodium chloride 0.9 % bolus 1,000 mL (0 mLs Intravenous Stopped 06/12/15 2003)    Labs Review Labs Reviewed  COMPREHENSIVE METABOLIC PANEL - Abnormal; Notable for the following:    Glucose, Bld 107 (*)    Creatinine, Ser 1.24 (*)    Total Protein 9.2 (*)    GFR calc non Af Amer 59 (*)    All other components within normal limits  CBC - Abnormal; Notable for the following:    Platelets 788 (*)    All other components within normal limits  URINALYSIS, ROUTINE W REFLEX MICROSCOPIC (NOT AT [facility]) - Abnormal; Notable for the following:    APPearance CLOUDY (*)    Hgb urine dipstick SMALL (*)    Leukocytes, UA MODERATE (*)    All other  components within normal limits  URINE MICROSCOPIC-ADD ON - Abnormal; Notable for the following:    Squamous Epithelial / LPF 6-30 (*)    Bacteria, UA MANY (*)    Casts HYALINE CASTS (*)    All other components within normal limits  LIPASE, BLOOD  PREGNANCY, URINE    Imaging Review No results found. I have personally reviewed and evaluated these images and lab results as part of my medical decision-making.  After treatment in the ED the patient feels back to baseline and wants to go home.  MDM   Final diagnoses:  Nausea        Nelva Nayobert Thanh Pomerleau, MD 06/17/15 [id number]

## 2015-06-12 NOTE — ED Notes (Signed)
Patient presents for N/V, chills x2 days. Recently admitted for acute kidney injury. Also c/o left sided abdominal pain. Rates pain 9/10. Denies diarrhea. Patient presents with subway sandwich in triage, states she took a bite before coming in and had episode of emesis.

## 2015-06-13 ENCOUNTER — Ambulatory Visit (INDEPENDENT_AMBULATORY_CARE_PROVIDER_SITE_OTHER): Payer: Medicaid Other | Admitting: Family Medicine

## 2015-06-13 ENCOUNTER — Encounter: Payer: Self-pay | Admitting: Family Medicine

## 2015-06-13 VITALS — BP 104/68 | HR 88 | Temp 98.5°F | Wt 151.9 lb

## 2015-06-13 DIAGNOSIS — N12 Tubulo-interstitial nephritis, not specified as acute or chronic: Secondary | ICD-10-CM

## 2015-06-13 DIAGNOSIS — N10 Acute tubulo-interstitial nephritis: Secondary | ICD-10-CM | POA: Diagnosis not present

## 2015-06-13 LAB — CBC
HCT: 40.7 % (ref 36.0–46.0)
Hemoglobin: 13.8 g/dL (ref 12.0–15.0)
MCH: 31.8 pg (ref 26.0–34.0)
MCHC: 33.9 g/dL (ref 30.0–36.0)
MCV: 93.8 fL (ref 78.0–100.0)
MPV: 10.2 fL (ref 8.6–12.4)
PLATELETS: 755 10*3/uL — AB (ref 150–400)
RBC: 4.34 MIL/uL (ref 3.87–5.11)
RDW: 13.6 % (ref 11.5–15.5)
WBC: 8.8 10*3/uL (ref 4.0–10.5)

## 2015-06-13 LAB — BASIC METABOLIC PANEL WITH GFR
BUN: 11 mg/dL (ref 7–25)
CHLORIDE: 99 mmol/L (ref 98–110)
CO2: 23 mmol/L (ref 20–31)
Calcium: 9.9 mg/dL (ref 8.6–10.2)
Creat: 1.26 mg/dL — ABNORMAL HIGH (ref 0.50–1.10)
GFR, EST AFRICAN AMERICAN: 67 mL/min (ref >=60)
GFR, EST NON AFRICAN AMERICAN: 58 mL/min — AB (ref >=60)
Glucose, Bld: 73 mg/dL (ref 65–99)
POTASSIUM: 5.4 mmol/L — AB (ref 3.5–5.3)
Sodium: 133 mmol/L — ABNORMAL LOW (ref 135–146)

## 2015-06-13 NOTE — Progress Notes (Signed)
   Subjective:    Patient ID: [name], female    DOB: 1986-01-12, 30 y.o.   MRN: [id number]  HPI  CC: hospital follow up   # Pyelonephritis hospitalization:  Has been feeling nauseas since leaving the hospital.. Went to ED last night for this and got fluids and some medicine which she hasn't picked up  Still has 3 days left of antibiotics  Feels like above symptoms are due to the antibiotics  Has had some intermittent right sided abdominal pain, feels some similar to pain prior to admission  Took ibuprofen 800mg  x 2 since leaving hospital ROS: no fevers, no chills, no dysuria  Social Hx: current smoker  Review of Systems   See HPI for ROS.   Past medical history, surgical, family, and social history reviewed and updated in the EMR as appropriate. Objective:  BP 104/68 mmHg  Pulse 88  Temp(Src) 98.5 F (36.9 C) (Oral)  Wt 151 lb 14.4 oz (68.901 kg)  SpO2 99% Vitals and nursing note reviewed  General: NAD CV: RRR, normal s1s2 no murmurs, rubs or gallop.  Resp: clear to auscultation bilaterally, normal effort Back: no CVA tenderness Abdomen: soft, nontender, nondistended.  Assessment & Plan:  No problem-specific assessment & plan notes found for this encounter.

## 2015-06-13 NOTE — Patient Instructions (Addendum)
Pick up the nausea medicine -- this may help with your headache.   Tylenol up to 1000mg  at a time every 6 hours (you should never take more than 4000mg  in a 24 hour period, so if taking 1000mg  I would only do this 3 times in a day)  We will re-check your blood work today.    Due for a pap smear, schedule this with your annual visit at your earliest convenience

## 2015-06-13 NOTE — Assessment & Plan Note (Signed)
3 days left of antibiotics. Still having some mild symptoms. Reviewed ED labs from yesterday and UA is much improved, still some some leukocyte esterase, interestingly her creatinine has jumped up again to 1.24 after it had normalized prior to discharge. She has also developed a thrombocytosis up to 788k, whereas throughout hospital it was in low 100s. She overall is doing better. Will recheck BMP, CBC today. Follow up likely next week with additional repeat labs (will need to call and discuss with her)

## 2015-06-20 ENCOUNTER — Inpatient Hospital Stay: Payer: Self-pay | Admitting: Family Medicine

## 2015-06-28 ENCOUNTER — Telehealth: Payer: Self-pay | Admitting: Family Medicine

## 2015-06-28 DIAGNOSIS — N10 Acute tubulo-interstitial nephritis: Secondary | ICD-10-CM

## 2015-06-28 NOTE — Telephone Encounter (Signed)
Attempted call to patient to ask that they come in for repeat lab work to see if results have improved after the infection has gone away. Mailbox full so was unable to leave a message. If patient calls can you relay my request to come in for some repeat lab work (which I will place as a future order)? Thank you. -Dr. Waynetta Sandy

## 2015-08-01 ENCOUNTER — Ambulatory Visit (INDEPENDENT_AMBULATORY_CARE_PROVIDER_SITE_OTHER): Payer: Medicaid Other | Admitting: *Deleted

## 2015-08-01 DIAGNOSIS — Z111 Encounter for screening for respiratory tuberculosis: Secondary | ICD-10-CM | POA: Diagnosis present

## 2015-08-01 NOTE — Progress Notes (Signed)
   PPD placed Left Forearm.  Pt to return 08/03/2015 for reading.  Pt tolerated intradermal injection. Clovis Pu, RN

## 2015-08-03 ENCOUNTER — Encounter: Payer: Self-pay | Admitting: *Deleted

## 2015-08-03 ENCOUNTER — Ambulatory Visit (INDEPENDENT_AMBULATORY_CARE_PROVIDER_SITE_OTHER): Payer: Medicaid Other | Admitting: *Deleted

## 2015-08-03 DIAGNOSIS — Z111 Encounter for screening for respiratory tuberculosis: Secondary | ICD-10-CM

## 2015-08-03 DIAGNOSIS — Z7689 Persons encountering health services in other specified circumstances: Secondary | ICD-10-CM

## 2015-08-03 LAB — TB SKIN TEST
Induration: 0 mm
TB SKIN TEST: NEGATIVE

## 2015-08-03 NOTE — Progress Notes (Signed)
   PPD Reading Note PPD read and results entered in [employer]. Result: 0 mm induration. Interpretation: Negative If test not read within 48-72 hours of initial placement, patient advised to repeat in other arm 1-3 weeks after this test. Allergic reaction: no  Mahaila Tischer L, RN  

## 2015-12-30 ENCOUNTER — Emergency Department (HOSPITAL_COMMUNITY)
Admission: EM | Admit: 2015-12-30 | Discharge: 2015-12-30 | Disposition: A | Discharge status: Home / Self Care | Payer: Medicaid Other | Attending: Emergency Medicine | Admitting: Emergency Medicine

## 2015-12-30 ENCOUNTER — Encounter (HOSPITAL_COMMUNITY): Payer: Self-pay | Admitting: Emergency Medicine

## 2015-12-30 DIAGNOSIS — F17210 Nicotine dependence, cigarettes, uncomplicated: Secondary | ICD-10-CM | POA: Insufficient documentation

## 2015-12-30 DIAGNOSIS — J45909 Unspecified asthma, uncomplicated: Secondary | ICD-10-CM | POA: Diagnosis not present

## 2015-12-30 DIAGNOSIS — Y939 Activity, unspecified: Secondary | ICD-10-CM | POA: Insufficient documentation

## 2015-12-30 DIAGNOSIS — S161XXA Strain of muscle, fascia and tendon at neck level, initial encounter: Secondary | ICD-10-CM | POA: Insufficient documentation

## 2015-12-30 DIAGNOSIS — X500XXA Overexertion from strenuous movement or load, initial encounter: Secondary | ICD-10-CM | POA: Diagnosis not present

## 2015-12-30 DIAGNOSIS — S46911A Strain of unspecified muscle, fascia and tendon at shoulder and upper arm level, right arm, initial encounter: Secondary | ICD-10-CM

## 2015-12-30 DIAGNOSIS — Y990 Civilian activity done for income or pay: Secondary | ICD-10-CM | POA: Diagnosis not present

## 2015-12-30 DIAGNOSIS — Y929 Unspecified place or not applicable: Secondary | ICD-10-CM | POA: Diagnosis not present

## 2015-12-30 DIAGNOSIS — M25511 Pain in right shoulder: Secondary | ICD-10-CM

## 2015-12-30 DIAGNOSIS — S4991XA Unspecified injury of right shoulder and upper arm, initial encounter: Secondary | ICD-10-CM | POA: Diagnosis present

## 2015-12-30 MED ORDER — NAPROXEN 500 MG PO TABS: 500.0000 mg | ORAL_TABLET | Freq: Two times a day (BID) | ORAL | 0 refills | Status: DC

## 2015-12-30 MED ORDER — METHOCARBAMOL 500 MG PO TABS
500.0000 mg | ORAL_TABLET | Freq: Once | ORAL | Status: AC
  Administered 2015-12-30: 500 mg via ORAL
  Filled 2015-12-30: qty 1

## 2015-12-30 MED ORDER — METHOCARBAMOL 500 MG PO TABS: 500.0000 mg | ORAL_TABLET | Freq: Two times a day (BID) | ORAL | 0 refills | Status: DC

## 2015-12-30 MED ORDER — KETOROLAC TROMETHAMINE 60 MG/2ML IM SOLN
30.0000 mg | Freq: Once | INTRAMUSCULAR | Status: AC
  Administered 2015-12-30: 30 mg via INTRAMUSCULAR
  Filled 2015-12-30: qty 2

## 2015-12-30 NOTE — ED Provider Notes (Signed)
[facility]-EMERGENCY DEPT Provider Note   CSN: [id number] Arrival date & time: 12/30/15  1705  First Provider Contact: 5:22 PM  By signing my name below, I, [name], attest that this documentation has been prepared under the direction and in the presence of Ajia Chadderdon Y Tahir Blank, PA-C. Electronically Signed: [name], ED Scribe. 12/30/15. 5:25 PM.    History   Chief Complaint Chief Complaint  Patient presents with  . Shoulder Pain    r/shoulder pain    HPI [name] is a 30 y.o. female.  The history is provided by the patient. No language interpreter was used.   HPI Comments: [name] is a 30 y.o. female who presents to the Emergency Department complaining of burning right shoulder pain onset today. She reports this morning prior to going to work the thought she may have had "knot" in her neck from sleeping on her left side wrong. Pt repots that she was lifting heavy boxes while she was at work today. She states that the pain is worse when turning her head in the right direction or lifting her arm. She reports some relief when applying pressure to the area. Pt states that she has tried applying ice with no relief. Pt denies injury or trauma. Pt doesn't report numbness or tingling.    Past Medical History:  Diagnosis Date  . Adenomyosis 2012   Planning hysterectomy   . ADHD (attention deficit hyperactivity disorder)    NO MEDS  . 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 Asthma 1988  . 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 Thyromegaly    H/O    Patient Active Problem List   Diagnosis Date Noted  . Sepsis secondary to UTI (HCC) 06/05/2015  . Pyelonephritis 06/02/2015  . Sepsis (HCC) 06/02/2015  . Thrombocytopenia (HCC) 06/02/2015  . AKI (acute kidney injury) (HCC) 06/02/2015  . Hypokalemia 06/02/2015  . Vomiting and diarrhea 09/03/2012  . ADHD (attention deficit hyperactivity disorder) 09/03/2012  . Chronic abdominal pain 05/27/2012  . Decreased vision 03/19/2012  .  Tobacco abuse 03/19/2012  . Refusal of blood transfusions as patient is Jehovah's Witness 11/11/2011  . Adenomyosis   . Asthma     Past Surgical History:  Procedure Laterality Date  . ENDOMETRIAL ABLATION  2011  . INCISE AND DRAIN ABCESS     surgical incision, wound produced mrsa  . TUBAL LIGATION  2010    OB History    Gravida Para Term Preterm AB Living   3 3 3     3    SAB TAB Ectopic Multiple Live Births                   Home Medications    Prior to Admission medications   Medication Sig Start Date End Date Taking? Authorizing Provider  albuterol (PROVENTIL HFA;VENTOLIN HFA) 108 (90 BASE) MCG/ACT inhaler Inhale 1 puff into the lungs every 6 (six) hours as needed for wheezing or shortness of breath.    Historical Provider, MD  ondansetron (ZOFRAN) 4 MG tablet Take 1 tablet (4 mg total) by mouth every 6 (six) hours. 06/12/15   Nelva Nay, MD  sulfamethoxazole-trimethoprim (BACTRIM DS,SEPTRA DS) 800-160 MG tablet Take 1 tablet by mouth every 12 (twelve) hours. 06/05/15   Jerald Kief, MD    Family History Family History  Problem Relation Age of Onset  . Depression Sister   . Depression Mother   . Diabetes Father   . Alzheimer's disease Maternal  Grandfather   . Ovarian cancer      maternal great grandmother  . Uterine cancer      maternal great aunt  . Testicular cancer Cousin     maternal cousin    Social History Social History  Substance Use Topics  . Smoking status: Current Every Day Smoker    Packs/day: 0.50    Types: Cigarettes  . Smokeless tobacco: Never Used  . Alcohol use 0.6 oz/week    1 Cans of beer per week     Comment: ocassionally     Allergies   Ciprofloxacin; Penicillins; and Rocephin [ceftriaxone]   Review of Systems Review of Systems  Musculoskeletal: Positive for myalgias (rigth shoulder).  Neurological: Positive for numbness.     Physical Exam Updated Vital Signs BP 109/66 (BP Location: Right Arm)   Pulse 88   Temp 98.4  F (36.9 C) (Oral)   Resp 18   Ht  (1.6 m)   Wt 157 lb (71.2 kg)   SpO2 100%   BMI 27.81 kg/m   Physical Exam  Constitutional: She is oriented to person, place, and time. She appears well-developed and well-nourished. No distress.  HENT:  Head: Normocephalic and atraumatic.  Eyes: Conjunctivae and EOM are normal.  Neck: Neck supple. No tracheal deviation present.  Cardiovascular: Normal rate.   Pulmonary/Chest: Effort normal. No respiratory distress.  Musculoskeletal: Normal range of motion.  Right trapezial tenderness to palpation No midline neck or back tenderness. No stepoff or deformity  Neurological: She is alert and oriented to person, place, and time.  Skin: Skin is warm and dry.  Psychiatric: She has a normal mood and affect. Her behavior is normal.  Nursing note and vitals reviewed.    ED Treatments / Results  Labs (all labs ordered are listed, but only abnormal results are displayed) Labs Reviewed - No data to display  EKG  EKG Interpretation None       Radiology No results found.  Procedures Procedures (including critical care time)  Medications Ordered in ED Medications - No data to display   Initial Impression / Assessment and Plan / ED Course  I have reviewed the triage vital signs and the nursing notes.  Pertinent labs & imaging results that were available during my care of the patient were reviewed by me and considered in my medical decision making (see chart for details).  Pt with soft tissue tenderness on exam. No midline spinal tenderness. Neuro exam intact. Will d/c home with rx for naproxen and robaxin. Supportive therapies discussed. Encouraged f/u with ortho.  Final Clinical Impressions(s) / ED Diagnoses   Final diagnoses:  Right shoulder pain  Cervical strain, initial encounter  Shoulder strain, right, initial encounter    New Prescriptions Discharge Medication List as of 12/30/2015  6:22 PM    START taking these  medications   Details  methocarbamol (ROBAXIN) 500 MG tablet Take 1 tablet (500 mg total) by mouth 2 (two) times daily., Starting Sun 12/30/2015, Print    naproxen (NAPROSYN) 500 MG tablet Take 1 tablet (500 mg total) by mouth 2 (two) times daily., Starting Sun 12/30/2015, Print        I personally performed the services described in this documentation, which was scribed in my presence. The recorded information has been reviewed and is accurate.    Carlene Coria, PA-C 01/01/16 2057    Zadie Rhine, MD 01/01/16 [phone]

## 2015-12-30 NOTE — ED Notes (Signed)
Bed: WA29 Expected date: 12/30/15 Expected time: 5:11 PM Means of arrival:  Comments: Rt shoulder pain from working

## 2015-12-30 NOTE — Discharge Instructions (Signed)
Take medications as prescribed to help with your pain. Call [facility] orthopedics for follow up if your symptoms persist.

## 2015-12-30 NOTE — ED Triage Notes (Signed)
Per EMS-pt called EMS due to pain in r/shoulder after lifting boxes today. Denies trauma. C/o burning sensation on r/shoulder

## 2015-12-30 NOTE — ED Notes (Signed)
Ice pack to the right shoulder

## 2016-02-24 ENCOUNTER — Encounter (HOSPITAL_COMMUNITY): Payer: Self-pay | Admitting: *Deleted

## 2016-02-24 ENCOUNTER — Inpatient Hospital Stay (HOSPITAL_COMMUNITY)
Admission: AD | Admit: 2016-02-24 | Discharge: 2016-02-24 | Disposition: A | Discharge status: Home / Self Care | Payer: Medicaid Other | Source: Ambulatory Visit | Attending: Obstetrics and Gynecology | Admitting: Obstetrics and Gynecology

## 2016-02-24 DIAGNOSIS — Z3202 Encounter for pregnancy test, result negative: Secondary | ICD-10-CM | POA: Insufficient documentation

## 2016-02-24 DIAGNOSIS — F17210 Nicotine dependence, cigarettes, uncomplicated: Secondary | ICD-10-CM | POA: Insufficient documentation

## 2016-02-24 DIAGNOSIS — N390 Urinary tract infection, site not specified: Secondary | ICD-10-CM | POA: Diagnosis not present

## 2016-02-24 DIAGNOSIS — R109 Unspecified abdominal pain: Secondary | ICD-10-CM | POA: Diagnosis present

## 2016-02-24 DIAGNOSIS — Z880 Allergy status to penicillin: Secondary | ICD-10-CM | POA: Diagnosis not present

## 2016-02-24 LAB — URINALYSIS, ROUTINE W REFLEX MICROSCOPIC
Bilirubin Urine: NEGATIVE
GLUCOSE, UA: NEGATIVE mg/dL
Ketones, ur: NEGATIVE mg/dL
LEUKOCYTES UA: NEGATIVE
Nitrite: POSITIVE — AB
PH: 7 (ref 5.0–8.0)
Protein, ur: NEGATIVE mg/dL
SPECIFIC GRAVITY, URINE: 1.02 (ref 1.005–1.030)

## 2016-02-24 LAB — URINE MICROSCOPIC-ADD ON

## 2016-02-24 LAB — POCT PREGNANCY, URINE: Preg Test, Ur: NEGATIVE

## 2016-02-24 MED ORDER — NITROFURANTOIN MONOHYD MACRO 100 MG PO CAPS: 100.0000 mg | ORAL_CAPSULE | Freq: Two times a day (BID) | ORAL | 0 refills | Status: DC

## 2016-02-24 NOTE — MAU Provider Note (Signed)
History     CSN: [id number]  Arrival date and time: 02/24/16 [id number]   First Provider Initiated Contact with Patient 02/24/16 2042      Chief Complaint  Patient presents with  . Abdominal Cramping   Abdominal Cramping  This is a new problem. The current episode started today. The onset quality is gradual. The problem occurs constantly. The problem has been unchanged. The pain is located in the suprapubic region. The pain is at a severity of 10/10. The quality of the pain is cramping. The abdominal pain radiates to the back. Associated symptoms include nausea and vomiting (once at 0400 today ). Pertinent negatives include no constipation, diarrhea, dysuria, fever or frequency. Nothing aggravates the pain. The pain is relieved by nothing. She has tried nothing for the symptoms.    Past Medical History:  Diagnosis Date  . Adenomyosis 2012   Planning hysterectomy   . ADHD (attention deficit hyperactivity disorder)    NO MEDS  . 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 Asthma 1988  . Thyromegaly    H/O    Past Surgical History:  Procedure Laterality Date  . ENDOMETRIAL ABLATION  2011  . INCISE AND DRAIN ABCESS     surgical incision, wound produced mrsa  . TUBAL LIGATION  2010    Family History  Problem Relation Age of Onset  . Alzheimer's disease Maternal Grandfather   . Depression Sister   . Depression Mother   . Diabetes Father   . Ovarian cancer      maternal great grandmother  . Uterine cancer      maternal great aunt  . Testicular cancer Cousin     maternal cousin    Social History  Substance Use Topics  . Smoking status: Current Some Day Smoker    Packs/day: 0.25    Types: Cigarettes  . Smokeless tobacco: Never Used  . Alcohol use 0.6 oz/week    1 Cans of beer per week     Comment: ocassionally    Allergies:  Allergies  Allergen Reactions  . Ciprofloxacin Hives  . Penicillins Hives    "All '-cillins.'" Has patient had a PCN reaction causing  immediate rash, facial/tongue/throat swelling, SOB or lightheadedness with hypotension: yes Has patient had a PCN reaction causing severe rash involving mucus membranes or skin necrosis: no Has patient had a PCN reaction that required hospitalization no Has patient had a PCN reaction occurring within the last 10 years: no If all of the above answers are "NO", then may proceed with Cephalosporin use.   . Rocephin [Ceftriaxone] Rash    Prescriptions Prior to Admission  Medication Sig Dispense Refill Last Dose  . albuterol (PROVENTIL HFA;VENTOLIN HFA) 108 (90 BASE) MCG/ACT inhaler Inhale 1 puff into the lungs every 6 (six) hours as needed for wheezing or shortness of breath.   Past Month at Unknown time  . methocarbamol (ROBAXIN) 500 MG tablet Take 1 tablet (500 mg total) by mouth 2 (two) times daily. 20 tablet 0   . naproxen (NAPROSYN) 500 MG tablet Take 1 tablet (500 mg total) by mouth 2 (two) times daily. 30 tablet 0   . ondansetron (ZOFRAN) 4 MG tablet Take 1 tablet (4 mg total) by mouth every 6 (six) hours. 12 tablet 0   . sulfamethoxazole-trimethoprim (BACTRIM DS,SEPTRA DS) 800-160 MG tablet Take 1 tablet by mouth every 12 (twelve) hours. 20 tablet 0 06/12/2015 at 1000    Review of Systems  Constitutional: Negative for chills  and fever.  Gastrointestinal: Positive for abdominal pain, nausea and vomiting (once at 0400 today ). Negative for constipation and diarrhea.  Genitourinary: Negative for dysuria, frequency and urgency.       "I feel a lot of pressure when I pee"   Physical Exam   Blood pressure 118/60, pulse 93, temperature 98.4 F (36.9 C), temperature source Oral, resp. rate 18, last menstrual period 01/11/2016.  Physical Exam  Nursing note and vitals reviewed. Constitutional: She is oriented to person, place, and time. She appears well-developed and well-nourished. No distress.  HENT:  Head: Normocephalic.  Cardiovascular: Normal rate.   Respiratory: Effort normal.  GI:  Soft. There is tenderness (just above pubic bone ). There is no rebound.  Genitourinary:  Genitourinary Comments: No CVA tenderness   Neurological: She is alert and oriented to person, place, and time.  Skin: Skin is warm and dry.  Psychiatric: She has a normal mood and affect.     Results for orders placed or performed during the hospital encounter of 02/24/16 (from the past 24 hour(s))  Urinalysis, Routine w reflex microscopic (not at [facility])     Status: Abnormal   Collection Time: 02/24/16  7:55 PM  Result Value Ref Range   Color, Urine YELLOW YELLOW   APPearance CLEAR CLEAR   Specific Gravity, Urine 1.020 1.005 - 1.030   pH 7.0 5.0 - 8.0   Glucose, UA NEGATIVE NEGATIVE mg/dL   Hgb urine dipstick TRACE (A) NEGATIVE   Bilirubin Urine NEGATIVE NEGATIVE   Ketones, ur NEGATIVE NEGATIVE mg/dL   Protein, ur NEGATIVE NEGATIVE mg/dL   Nitrite POSITIVE (A) NEGATIVE   Leukocytes, UA NEGATIVE NEGATIVE  Urine microscopic-add on     Status: Abnormal   Collection Time: 02/24/16  7:55 PM  Result Value Ref Range   Squamous Epithelial / LPF 0-5 (A) NONE SEEN   WBC, UA 0-5 0 - 5 WBC/hpf   RBC / HPF 0-5 0 - 5 RBC/hpf   Bacteria, UA MANY (A) NONE SEEN  Pregnancy, urine POC     Status: None   Collection Time: 02/24/16  8:05 PM  Result Value Ref Range   Preg Test, Ur NEGATIVE NEGATIVE    MAU Course  Procedures  MDM Patient declines STD testing today   Assessment and Plan   1. Urinary tract infection, site not specified    DC home Comfort measures reviewed  RX: macrobid BID x 5 days  Return to MAU as needed FU with OB as planned  Follow-up Information    [facility] .   Specialty:  Family Medicine Contact information: [address] [id number] mc [place] [address] [phone]           Tawnya Crook 02/24/2016, 8:43 PM

## 2016-02-24 NOTE — Discharge Instructions (Signed)

## 2016-02-24 NOTE — MAU Note (Addendum)
Pt reports she started having abd cramping and nausea vomited once this morning and back pain. She usually just spots when she has her cycle. Had endometrial ablation 7 years ago. Has not had any spotting this month. Stated she had these symptoms when she was pregnant.

## 2016-04-24 ENCOUNTER — Encounter (HOSPITAL_COMMUNITY): Payer: Self-pay | Admitting: Emergency Medicine

## 2016-04-24 ENCOUNTER — Emergency Department (HOSPITAL_COMMUNITY)
Admission: EM | Admit: 2016-04-24 | Discharge: 2016-04-24 | Disposition: A | Discharge status: Home / Self Care | Payer: Medicaid Other | Attending: Emergency Medicine | Admitting: Emergency Medicine

## 2016-04-24 DIAGNOSIS — K029 Dental caries, unspecified: Secondary | ICD-10-CM | POA: Insufficient documentation

## 2016-04-24 DIAGNOSIS — F17210 Nicotine dependence, cigarettes, uncomplicated: Secondary | ICD-10-CM | POA: Insufficient documentation

## 2016-04-24 DIAGNOSIS — K0889 Other specified disorders of teeth and supporting structures: Secondary | ICD-10-CM | POA: Diagnosis present

## 2016-04-24 DIAGNOSIS — J45909 Unspecified asthma, uncomplicated: Secondary | ICD-10-CM | POA: Insufficient documentation

## 2016-04-24 DIAGNOSIS — F909 Attention-deficit hyperactivity disorder, unspecified type: Secondary | ICD-10-CM | POA: Insufficient documentation

## 2016-04-24 MED ORDER — IBUPROFEN 800 MG PO TABS: 800.0000 mg | ORAL_TABLET | Freq: Four times a day (QID) | ORAL | 0 refills | Status: DC | PRN

## 2016-04-24 MED ORDER — ACETAMINOPHEN 500 MG PO TABS
1000.0000 mg | ORAL_TABLET | Freq: Once | ORAL | Status: AC
  Administered 2016-04-24: 1000 mg via ORAL
  Filled 2016-04-24: qty 2

## 2016-04-24 NOTE — ED Notes (Signed)
ED Provider at bedside. 

## 2016-04-24 NOTE — ED Triage Notes (Signed)
Pt sts right sided lower dental pain x 3 days

## 2016-04-24 NOTE — ED Provider Notes (Signed)
[facility]-EMERGENCY DEPT Provider Note   CSN: [id number] Arrival date & time: 04/24/16  1200     History   Chief Complaint Chief Complaint  Patient presents with  . Dental Pain    HPI Alease FrameKimberly Porras is a 30 y.o. female.  The history is provided by the patient.  Dental Pain   This is a new problem. Episode onset: 3 days ago. The problem occurs constantly. The problem has not changed since onset.The pain is moderate. Treatments tried: ibuprofen. The treatment provided no relief.    Past Medical History:  Diagnosis Date  . Adenomyosis 2012   Planning hysterectomy   . ADHD (attention deficit hyperactivity disorder)    NO MEDS  . 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 Asthma 1988  . Thyromegaly    H/O    Patient Active Problem List   Diagnosis Date Noted  . Sepsis secondary to UTI (HCC) 06/05/2015  . Pyelonephritis 06/02/2015  . Sepsis (HCC) 06/02/2015  . Thrombocytopenia (HCC) 06/02/2015  . AKI (acute kidney injury) (HCC) 06/02/2015  . Hypokalemia 06/02/2015  . Vomiting and diarrhea 09/03/2012  . ADHD (attention deficit hyperactivity disorder) 09/03/2012  . Chronic abdominal pain 05/27/2012  . Decreased vision 03/19/2012  . Tobacco abuse 03/19/2012  . Refusal of blood transfusions as patient is Jehovah's Witness 11/11/2011  . Adenomyosis   . Asthma     Past Surgical History:  Procedure Laterality Date  . ENDOMETRIAL ABLATION  2011  . INCISE AND DRAIN ABCESS     surgical incision, wound produced mrsa  . TUBAL LIGATION  2010    OB History    Gravida Para Term Preterm AB Living   3 3 3     3    SAB TAB Ectopic Multiple Live Births           3       Home Medications    Prior to Admission medications   Medication Sig Start Date End Date Taking? Authorizing Provider  albuterol (PROVENTIL HFA;VENTOLIN HFA) 108 (90 BASE) MCG/ACT inhaler Inhale 1 puff into the lungs every 6 (six) hours as needed for wheezing or shortness of breath.    Historical Provider, MD   methocarbamol (ROBAXIN) 500 MG tablet Take 1 tablet (500 mg total) by mouth 2 (two) times daily. 12/30/15   Ace GinsSerena Y Sam, PA-C  naproxen (NAPROSYN) 500 MG tablet Take 1 tablet (500 mg total) by mouth 2 (two) times daily. 12/30/15   Ace GinsSerena Y Sam, PA-C  nitrofurantoin, macrocrystal-monohydrate, (MACROBID) 100 MG capsule Take 1 capsule (100 mg total) by mouth 2 (two) times daily. 02/24/16   Armando ReichertHeather D Hogan, CNM  ondansetron (ZOFRAN) 4 MG tablet Take 1 tablet (4 mg total) by mouth every 6 (six) hours. 06/12/15   Nelva Nayobert Beaton, MD  sulfamethoxazole-trimethoprim (BACTRIM DS,SEPTRA DS) 800-160 MG tablet Take 1 tablet by mouth every 12 (twelve) hours. 06/05/15   Jerald KiefStephen K Chiu, MD    Family History Family History  Problem Relation Age of Onset  . Alzheimer's disease Maternal Grandfather   . Depression Sister   . Depression Mother   . Diabetes Father   . Ovarian cancer      maternal great grandmother  . Uterine cancer      maternal great aunt  . Testicular cancer Cousin     maternal cousin    Social History Social History  Substance Use Topics  . Smoking status: Current Some Day Smoker    Packs/day: 0.25  Types: Cigarettes  . Smokeless tobacco: Never Used  . Alcohol use 0.6 oz/week    1 Cans of beer per week     Comment: ocassionally     Allergies   Ciprofloxacin; Penicillins; and Rocephin [ceftriaxone]   Review of Systems Review of Systems  All other systems reviewed and are negative.    Physical Exam Updated Vital Signs BP 116/71 (BP Location: Right Arm)   Pulse 90   Temp 98.6 F (37 C) (Oral)   Resp 18   SpO2 100%   Physical Exam  Constitutional: She is oriented to person, place, and time. She appears well-developed and well-nourished. No distress.  HENT:  Head: Normocephalic.  Nose: Nose normal.  Mouth/Throat: Oropharynx is clear and moist and mucous membranes are normal. No oral lesions. No trismus in the jaw. Dental caries present. No dental abscesses or uvula  swelling.    Eyes: Conjunctivae are normal.  Neck: Neck supple. No tracheal deviation present.  Cardiovascular: Normal rate and regular rhythm.   Pulmonary/Chest: Effort normal. No respiratory distress.  Abdominal: Soft. She exhibits no distension.  Neurological: She is alert and oriented to person, place, and time.  Skin: Skin is warm and dry.  Psychiatric: She has a normal mood and affect.     ED Treatments / Results  Labs (all labs ordered are listed, but only abnormal results are displayed) Labs Reviewed - No data to display  EKG  EKG Interpretation None       Radiology No results found.  Procedures Procedures (including critical care time)  Medications Ordered in ED Medications  acetaminophen (TYLENOL) tablet 1,000 mg (1,000 mg Oral Given 04/24/16 1224)     Initial Impression / Assessment and Plan / ED Course  I have reviewed the triage vital signs and the nursing notes.  Pertinent labs & imaging results that were available during my care of the patient were reviewed by me and considered in my medical decision making (see chart for details).  Clinical Course     After thorough assessment of mouth and teeth, Pt appears to have dental caries without abscess. No ludwig's angina, no systemic symptoms to suggest sepsis or endocarditis. High dose NSAIDs and tylenol as needed for pain at home. Pt will be offered alveolar block for comfort in interim. Pt was advised that definitive care for dental pain likely secondary to acute pulpitis is with a dental health professional.   Final Clinical Impressions(s) / ED Diagnoses   Final diagnoses:  Pain, dental    New Prescriptions New Prescriptions   IBUPROFEN (ADVIL,MOTRIN) 800 MG TABLET    Take 1 tablet (800 mg total) by mouth every 6 (six) hours as needed for moderate pain.     Lyndal Pulleyaniel Beckem Tomberlin, MD 04/24/16 1225

## 2016-06-08 ENCOUNTER — Encounter (HOSPITAL_COMMUNITY): Payer: Self-pay | Admitting: *Deleted

## 2016-06-08 ENCOUNTER — Inpatient Hospital Stay (HOSPITAL_COMMUNITY): Payer: Medicaid Other

## 2016-06-08 ENCOUNTER — Inpatient Hospital Stay (HOSPITAL_COMMUNITY)
Admission: AD | Admit: 2016-06-08 | Discharge: 2016-06-08 | Disposition: A | Discharge status: Home / Self Care | Payer: Medicaid Other | Source: Ambulatory Visit | Attending: Obstetrics and Gynecology | Admitting: Obstetrics and Gynecology

## 2016-06-08 DIAGNOSIS — A5901 Trichomonal vulvovaginitis: Secondary | ICD-10-CM | POA: Insufficient documentation

## 2016-06-08 DIAGNOSIS — F17210 Nicotine dependence, cigarettes, uncomplicated: Secondary | ICD-10-CM | POA: Insufficient documentation

## 2016-06-08 DIAGNOSIS — R1030 Lower abdominal pain, unspecified: Secondary | ICD-10-CM | POA: Diagnosis present

## 2016-06-08 DIAGNOSIS — Z3202 Encounter for pregnancy test, result negative: Secondary | ICD-10-CM | POA: Diagnosis not present

## 2016-06-08 DIAGNOSIS — Z880 Allergy status to penicillin: Secondary | ICD-10-CM | POA: Diagnosis not present

## 2016-06-08 DIAGNOSIS — R102 Pelvic and perineal pain: Secondary | ICD-10-CM | POA: Insufficient documentation

## 2016-06-08 LAB — CBC WITH DIFFERENTIAL/PLATELET
BASOS ABS: 0 10*3/uL (ref 0.0–0.1)
BASOS PCT: 0 %
EOS ABS: 0.2 10*3/uL (ref 0.0–0.7)
Eosinophils Relative: 3 %
HEMATOCRIT: 39.6 % (ref 36.0–46.0)
Hemoglobin: 13.8 g/dL (ref 12.0–15.0)
Lymphocytes Relative: 43 %
Lymphs Abs: 2.6 10*3/uL (ref 0.7–4.0)
MCH: 33.1 pg (ref 26.0–34.0)
MCHC: 34.8 g/dL (ref 30.0–36.0)
MCV: 95 fL (ref 78.0–100.0)
MONO ABS: 0.3 10*3/uL (ref 0.1–1.0)
MONOS PCT: 5 %
NEUTROS ABS: 3 10*3/uL (ref 1.7–7.7)
Neutrophils Relative %: 49 %
PLATELETS: 328 10*3/uL (ref 150–400)
RBC: 4.17 MIL/uL (ref 3.87–5.11)
RDW: 13.3 % (ref 11.5–15.5)
WBC: 6 10*3/uL (ref 4.0–10.5)

## 2016-06-08 LAB — COMPREHENSIVE METABOLIC PANEL
ALBUMIN: 4 g/dL (ref 3.5–5.0)
ALT: 20 U/L (ref 14–54)
ANION GAP: 7 (ref 5–15)
AST: 19 U/L (ref 15–41)
Alkaline Phosphatase: 62 U/L (ref 38–126)
BILIRUBIN TOTAL: 0.6 mg/dL (ref 0.3–1.2)
BUN: 7 mg/dL (ref 6–20)
CHLORIDE: 100 mmol/L — AB (ref 101–111)
CO2: 28 mmol/L (ref 22–32)
Calcium: 9 mg/dL (ref 8.9–10.3)
Creatinine, Ser: 0.78 mg/dL (ref 0.44–1.00)
GFR calc Af Amer: >60 mL/min (ref >60)
GFR calc non Af Amer: >60 mL/min (ref >60)
Glucose, Bld: 86 mg/dL (ref 65–99)
POTASSIUM: 3.9 mmol/L (ref 3.5–5.1)
SODIUM: 135 mmol/L (ref 135–145)
Total Protein: 7.3 g/dL (ref 6.5–8.1)

## 2016-06-08 LAB — WET PREP, GENITAL
SPERM: NONE SEEN
Yeast Wet Prep HPF POC: NONE SEEN

## 2016-06-08 LAB — HCG, SERUM, QUALITATIVE: PREG SERUM: NEGATIVE

## 2016-06-08 LAB — URINALYSIS, ROUTINE W REFLEX MICROSCOPIC
BACTERIA UA: NONE SEEN
Bilirubin Urine: NEGATIVE
Glucose, UA: NEGATIVE mg/dL
KETONES UR: NEGATIVE mg/dL
Leukocytes, UA: NEGATIVE
NITRITE: NEGATIVE
PROTEIN: NEGATIVE mg/dL
Specific Gravity, Urine: 1.017 (ref 1.005–1.030)
pH: 7 (ref 5.0–8.0)

## 2016-06-08 LAB — POCT PREGNANCY, URINE: PREG TEST UR: NEGATIVE

## 2016-06-08 MED ORDER — METRONIDAZOLE 500 MG PO TABS
2000.0000 mg | ORAL_TABLET | Freq: Once | ORAL | Status: AC
  Administered 2016-06-08: 2000 mg via ORAL
  Filled 2016-06-08: qty 4

## 2016-06-08 MED ORDER — ONDANSETRON 4 MG PO TBDP
4.0000 mg | ORAL_TABLET | Freq: Once | ORAL | Status: AC
  Administered 2016-06-08: 4 mg via ORAL
  Filled 2016-06-08: qty 1

## 2016-06-08 NOTE — MAU Note (Signed)
Pt given medication and advised that she would need to stay for 20 minutes before discharge. States she does not want to stay and requested her discharge paperwork. Discharged in stable condition

## 2016-06-08 NOTE — Discharge Instructions (Signed)
Trichomoniasis Trichomoniasis is an infection caused by an organism called Trichomonas. The infection can affect both women and men. In women, the outer female genitalia and the vagina are affected. In men, the penis is mainly affected, but the prostate and other reproductive organs can also be involved. Trichomoniasis is a sexually transmitted infection (STI) and is most often passed to another person through sexual contact.  RISK FACTORS  Having unprotected sexual intercourse.  Having sexual intercourse with an infected partner. SIGNS AND SYMPTOMS  Symptoms of trichomoniasis in women include:  Abnormal gray-green frothy vaginal discharge.  Itching and irritation of the vagina.  Itching and irritation of the area outside the vagina. Symptoms of trichomoniasis in men include:   Penile discharge with or without pain.  Pain during urination. This results from inflammation of the urethra. DIAGNOSIS  Trichomoniasis may be found during a Pap test or physical exam. Your health care provider may use one of the following methods to help diagnose this infection:  Testing the pH of the vagina with a test tape.  Using a vaginal swab test that checks for the Trichomonas organism. A test is available that provides results within a few minutes.  Examining a urine sample.  Testing vaginal secretions. Your health care provider may test you for other STIs, including HIV. TREATMENT   You may be given medicine to fight the infection. Women should inform their health care provider if they could be or are pregnant. Some medicines used to treat the infection should not be taken during pregnancy.  Your health care provider may recommend over-the-counter medicines or creams to decrease itching or irritation.  Your sexual partner will need to be treated if infected.  Your health care provider may test you for infection again 3 months after treatment. HOME CARE INSTRUCTIONS   Take medicines only as  directed by your health care provider.  Take over-the-counter medicine for itching or irritation as directed by your health care provider.  Do not have sexual intercourse while you have the infection.  Women should not douche or wear tampons while they have the infection.  Discuss your infection with your partner. Your partner may have gotten the infection from you, or you may have gotten it from your partner.  Have your sex partner get examined and treated if necessary.  Practice safe, informed, and protected sex.  See your health care provider for other STI testing. SEEK MEDICAL CARE IF:   You still have symptoms after you finish your medicine.  You develop abdominal pain.  You have pain when you urinate.  You have bleeding after sexual intercourse.  You develop a rash.  Your medicine makes you sick or makes you throw up (vomit). MAKE SURE YOU:  Understand these instructions.  Will watch your condition.  Will get help right away if you are not doing well or get worse. This information is not intended to replace advice given to you by your health care provider. Make sure you discuss any questions you have with your health care provider. Document Released: 11/19/2000 Document Revised: 06/16/2014 Document Reviewed: 03/07/2013 Elsevier Interactive Patient Education  2017 [employer]   OB/GYN Providers [facility]    [facility]  [phone]     Phone: [phone]          [facility]                      [facility]  @[place]     Phone: [phone]  Phone: [phone]         [facility]     Phone: [phone]  Phone: [phone]           [facility] [place]                hone: [phone]  Phone: [phone]         [facility] [address] Franco[address] Ce[facility]      Phone: [phone]  Phone:  [phone]         [facility]                Phone: [phone]  [facility]   [address]:[phone]    [facility]([place])          Phone: [phone] [facility]   Phone: [phone]

## 2016-06-08 NOTE — MAU Provider Note (Signed)
Chief Complaint:  No chief complaint on file.   First Provider Initiated Contact with Patient 06/08/16 1651     HPI: [name] is a 30 y.o. G3P3003 who presents to maternity admissions reporting severe lower abdominal cramping, weight gain, enlarged abdomen (comes and goes), and back pain.  . She reports no vaginal itching/burning, urinary symptoms, h/a, dizziness, n/v, or fever/chills.  Requests blood pregnancy test since she states has been pregnant before with negative urine test.  Very upset that "no one cares what is wrong with me.  They just say Im not pregnant and send me home"  States has complained multiple times over the past year or two and "no one cares about me because I have Medicaid".  Was admitted for urosepsis in December of 2016.  Has a history of ablation in 2010 by Dr Su Hiltoberts (note states it was for menorrhagia).  Patient states "she told me I had adenomyosis, which I didn't have, so she cut me for no reason".  Notes reveal patient later discussed hysterectomy with Dr Su Hiltoberts (but it was never done)  States no one has done an ultrasound "and I know there is something very wrong".  Chart reviewed with patient that she did have a pelvic US with a renal US while hospitalized last year. Pt states no one ever told her that.    Abdominal Pain  This is a recurrent problem. The current episode started 1 to 4 weeks ago. The onset quality is gradual. The problem occurs intermittently. The problem has been unchanged. The pain is located in the LLQ, RLQ and suprapubic region. The pain is severe. The quality of the pain is cramping, sharp and colicky. The abdominal pain does not radiate. Pertinent negatives include no arthralgias, constipation, diarrhea, dysuria, fever, frequency, myalgias, nausea or vomiting. Nothing aggravates the pain. The pain is relieved by nothing. She has tried nothing for the symptoms.    RN Note: Pt states that since the 5th of Nov she started having pregnancy  symptoms.  Pt states she has had a tubal.  Pt states she gain 20lbs.  Pt states that she started having abdominal and back pain a few days ago.  Pt states she last had a period in Nov.  Past Medical History: Past Medical History:  Diagnosis Date  . Adenomyosis 2012   Planning hysterectomy   . ADHD (attention deficit hyperactivity disorder)    NO MEDS  . 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 Asthma 1988  . Thyromegaly    H/O    Past obstetric history: OB History  Gravida Para Term Preterm AB Living  3 3 3     3   SAB TAB Ectopic Multiple Live Births          3    # Outcome Date GA Lbr Len/2nd Weight Sex Delivery Anes PTL Lv  3 Term      Vag-Spont   LIV  2 Term      Vag-Spont   LIV  1 Term      Vag-Spont   LIV      Past Surgical History: Past Surgical History:  Procedure Laterality Date  . ENDOMETRIAL ABLATION  2011  . INCISE AND DRAIN ABCESS     surgical incision, wound produced mrsa  . TUBAL LIGATION  2010    Family History: Family History  Problem Relation Age of Onset  . Alzheimer's disease Maternal Grandfather   . Depression Sister   . Depression Mother   .  Diabetes Father   . Ovarian cancer      maternal great grandmother  . Uterine cancer      maternal great aunt  . Testicular cancer Cousin     maternal cousin    Social History: Social History  Substance Use Topics  . Smoking status: Current Some Day Smoker    Packs/day: 0.25    Types: Cigarettes  . Smokeless tobacco: Never Used  . Alcohol use 0.6 oz/week    1 Cans of beer per week     Comment: ocassionally    Allergies:  Allergies  Allergen Reactions  . Ciprofloxacin Hives  . Penicillins Hives    "All '-cillins.'" Has patient had a PCN reaction causing immediate rash, facial/tongue/throat swelling, SOB or lightheadedness with hypotension: yes Has patient had a PCN reaction causing severe rash involving mucus membranes or skin necrosis: no Has patient had a PCN reaction that required  hospitalization no Has patient had a PCN reaction occurring within the last 10 years: no If all of the above answers are "NO", then may proceed with Cephalosporin use.   . Rocephin [Ceftriaxone] Rash    Meds:  Prescriptions Prior to Admission  Medication Sig Dispense Refill Last Dose  . albuterol (PROVENTIL HFA;VENTOLIN HFA) 108 (90 BASE) MCG/ACT inhaler Inhale 1 puff into the lungs every 6 (six) hours as needed for wheezing or shortness of breath.   Past Month at Unknown time  . ibuprofen (ADVIL,MOTRIN) 800 MG tablet Take 1 tablet (800 mg total) by mouth every 6 (six) hours as needed for moderate pain. 40 tablet 0   . methocarbamol (ROBAXIN) 500 MG tablet Take 1 tablet (500 mg total) by mouth 2 (two) times daily. 20 tablet 0   . naproxen (NAPROSYN) 500 MG tablet Take 1 tablet (500 mg total) by mouth 2 (two) times daily. 30 tablet 0   . nitrofurantoin, macrocrystal-monohydrate, (MACROBID) 100 MG capsule Take 1 capsule (100 mg total) by mouth 2 (two) times daily. 10 capsule 0   . ondansetron (ZOFRAN) 4 MG tablet Take 1 tablet (4 mg total) by mouth every 6 (six) hours. 12 tablet 0   . sulfamethoxazole-trimethoprim (BACTRIM DS,SEPTRA DS) 800-160 MG tablet Take 1 tablet by mouth every 12 (twelve) hours. 20 tablet 0 06/12/2015 at 1000    I have reviewed patient's Past Medical Hx, Surgical Hx, Family Hx, Social Hx, medications and allergies.  ROS:  Review of Systems  Constitutional: Negative for fever.  Gastrointestinal: Positive for abdominal pain. Negative for constipation, diarrhea, nausea and vomiting.  Genitourinary: Negative for dysuria and frequency.  Musculoskeletal: Negative for arthralgias and myalgias.   Other systems negative     Physical Exam  Patient Vitals for the past 24 hrs:  BP Temp Temp src Pulse Resp SpO2  06/08/16 1603 106/62 97.9 F (36.6 C) Oral 79 16 100 %   Constitutional: Well-developed, well-nourished female in no acute distress, but anxious and uncomfortable  appearing.  Cardiovascular: normal rate and rhythm, no ectopy audible, S1 & S2 heard, no murmur Respiratory: normal effort, no distress. Lungs CTAB with no wheezes or crackles GI: Abd soft, moderately tender to palpation over uterus.  Somewhat tender over RLQ but no rebound.  Nondistended.  No rebound, No guarding.  Bowel Sounds audible  MS: Extremities nontender, no edema, normal ROM Neurologic: Alert and oriented x 4.   Grossly nonfocal. GU: Neg CVAT. Skin:  Warm and Dry Psych:  Affect appropriate.  PELVIC EXAM: Cervix pink, visually closed, without lesion, moderate white creamy  discharge, vaginal walls and external genitalia normal Bimanual exam: Cervix firm, anterior, neg CMT, uterus nontender, nonenlarged, adnexa without tenderness, enlargement, or mass    Labs: Results for orders placed or performed during the hospital encounter of 06/08/16 (from the past 24 hour(s))  Urinalysis, Routine w reflex microscopic     Status: Abnormal   Collection Time: 06/08/16  3:50 PM  Result Value Ref Range   Color, Urine YELLOW YELLOW   APPearance CLEAR CLEAR   Specific Gravity, Urine 1.017 1.005 - 1.030   pH 7.0 5.0 - 8.0   Glucose, UA NEGATIVE NEGATIVE mg/dL   Hgb urine dipstick SMALL (A) NEGATIVE   Bilirubin Urine NEGATIVE NEGATIVE   Ketones, ur NEGATIVE NEGATIVE mg/dL   Protein, ur NEGATIVE NEGATIVE mg/dL   Nitrite NEGATIVE NEGATIVE   Leukocytes, UA NEGATIVE NEGATIVE   RBC / HPF 0-5 0 - 5 RBC/hpf   WBC, UA 0-5 0 - 5 WBC/hpf   Bacteria, UA NONE SEEN NONE SEEN   Squamous Epithelial / LPF 0-5 (A) NONE SEEN   Mucous PRESENT   Pregnancy, urine POC     Status: None   Collection Time: 06/08/16  3:55 PM  Result Value Ref Range   Preg Test, Ur NEGATIVE NEGATIVE  Wet prep, genital     Status: Abnormal   Collection Time: 06/08/16  5:30 PM  Result Value Ref Range   Yeast Wet Prep HPF POC NONE SEEN NONE SEEN   Trich, Wet Prep PRESENT (A) NONE SEEN   Clue Cells Wet Prep HPF POC PRESENT (A) NONE  SEEN   WBC, Wet Prep HPF POC MODERATE (A) NONE SEEN   Sperm NONE SEEN   CBC with Differential/Platelet     Status: None   Collection Time: 06/08/16  5:36 PM  Result Value Ref Range   WBC 6.0 4.0 - 10.5 K/uL   RBC 4.17 3.87 - 5.11 MIL/uL   Hemoglobin 13.8 12.0 - 15.0 g/dL   HCT 62.139.6 30.836.0 - 65.746.0 %   MCV 95.0 78.0 - 100.0 fL   MCH 33.1 26.0 - 34.0 pg   MCHC 34.8 30.0 - 36.0 g/dL   RDW 84.613.3 96.211.5 - 95.215.5 %   Platelets 328 150 - 400 K/uL   Neutrophils Relative % 49 %   Neutro Abs 3.0 1.7 - 7.7 K/uL   Lymphocytes Relative 43 %   Lymphs Abs 2.6 0.7 - 4.0 K/uL   Monocytes Relative 5 %   Monocytes Absolute 0.3 0.1 - 1.0 K/uL   Eosinophils Relative 3 %   Eosinophils Absolute 0.2 0.0 - 0.7 K/uL   Basophils Relative 0 %   Basophils Absolute 0.0 0.0 - 0.1 K/uL  Comprehensive metabolic panel     Status: Abnormal   Collection Time: 06/08/16  5:36 PM  Result Value Ref Range   Sodium 135 135 - 145 mmol/L   Potassium 3.9 3.5 - 5.1 mmol/L   Chloride 100 (L) 101 - 111 mmol/L   CO2 28 22 - 32 mmol/L   Glucose, Bld 86 65 - 99 mg/dL   BUN 7 6 - 20 mg/dL   Creatinine, Ser 8.410.78 0.44 - 1.00 mg/dL   Calcium 9.0 8.9 - 32.410.3 mg/dL   Total Protein 7.3 6.5 - 8.1 g/dL   Albumin 4.0 3.5 - 5.0 g/dL   AST 19 15 - 41 U/L   ALT 20 14 - 54 U/L   Alkaline Phosphatase 62 38 - 126 U/L   Total Bilirubin 0.6 0.3 - 1.2 mg/dL  GFR calc non Af Amer >60 >60 mL/min   GFR calc Af Amer >60 >60 mL/min   Anion gap 7 5 - 15  hCG, serum, qualitative     Status: None   Collection Time: 06/08/16  5:36 PM  Result Value Ref Range   Preg, Serum NEGATIVE NEGATIVE      Imaging:  No results found.  MAU Course/MDM: I have ordered labs as follows:  CBC to help rule out acute infection like appendicitis, Serum pregnancy, UA and STD testing as requested.  Imaging ordered: Pelvic US ordered due to severe uterine and RLQ tenderness Results reviewed. All studies are normal except for Trichomonas.  Discussed this can cause pelvic  tenderness. Other causes can include GI disturbances or bladder spasms, though urine is clear   Treatments in MAU included Metronidazole 2gms .   Pt stable at time of discharge.  Assessment: 1. Pelvic pain   2.   Trichomonal infection  Plan: Discharge home Recommend abstinence for 2 weeks until both treated.; Ibuprofen for cramping Follow up with Primary doctor for possible referral to Urology  Encouraged to return here or to other Urgent Care/ED if she develops worsening of symptoms, increase in pain, fever, or other concerning symptoms.   Wynelle Bourgeois CNM, MSN Certified Nurse-Midwife 06/08/2016 6:28 PM

## 2016-06-08 NOTE — MAU Note (Signed)
Pt states that since the 5th of Nov she started having pregnancy symptoms.  Pt states she has had a tubal.  Pt states she gain 20lbs.  Pt states that she started having abdominal and back pain a few days ago.  Pt states she last had a period in Nov.

## 2016-06-09 LAB — HIV ANTIBODY (ROUTINE TESTING W REFLEX): HIV SCREEN 4TH GENERATION: NONREACTIVE

## 2016-06-10 LAB — GC/CHLAMYDIA PROBE AMP (~~LOC~~) NOT AT ARMC
CHLAMYDIA, DNA PROBE: NEGATIVE
NEISSERIA GONORRHEA: NEGATIVE

## 2016-06-12 ENCOUNTER — Encounter: Payer: Self-pay | Admitting: Family Medicine

## 2016-06-29 ENCOUNTER — Emergency Department (HOSPITAL_COMMUNITY)
Admission: EM | Admit: 2016-06-29 | Discharge: 2016-06-29 | Disposition: A | Discharge status: Home / Self Care | Payer: Medicaid Other

## 2016-06-29 NOTE — ED Notes (Signed)
Unable to locate pt after multiple attempts. 

## 2016-06-30 ENCOUNTER — Ambulatory Visit (INDEPENDENT_AMBULATORY_CARE_PROVIDER_SITE_OTHER): Payer: Medicaid Other | Admitting: Family Medicine

## 2016-06-30 VITALS — BP 100/68 | HR 82 | Temp 98.8°F | Ht 63.0 in | Wt 154.8 lb

## 2016-06-30 DIAGNOSIS — J069 Acute upper respiratory infection, unspecified: Secondary | ICD-10-CM | POA: Diagnosis not present

## 2016-06-30 DIAGNOSIS — R52 Pain, unspecified: Secondary | ICD-10-CM

## 2016-06-30 LAB — INFLUENZA PANEL BY PCR (TYPE A & B)
INFLBPCR: NEGATIVE
Influenza A By PCR: NEGATIVE

## 2016-06-30 MED ORDER — OSELTAMIVIR PHOSPHATE 75 MG PO CAPS: 75.0000 mg | ORAL_CAPSULE | Freq: Two times a day (BID) | ORAL | 0 refills | Status: AC

## 2016-06-30 NOTE — Progress Notes (Signed)
   Subjective:    Patient ID: [name] , female   DOB: 1985-07-08 , 31 y.o..   MRN: [id number]  HPI  [name] is here for a same day visit for  Chief Complaint  Patient presents with  . possible flu   URI  Has been sick for 2 days. Nasal discharge: yes, clear bilateral  Medications tried: Tylenol as needed Sick contacts: None that she knows of   Symptoms Fever: Yes, had a temp of 102.4 F today  Headache or face pain: Is having occasional headache  Tooth pain: None Sneezing: No Scratchy throat: Yes  Allergies: No Muscle aches: Yes, diffuse body aches Severe fatigue: Yes Stiff neck: No  Shortness of breath: No3 Rash: No Sore throat or swollen glands: Yes sore throat, no swollen glands   ROS see HPI Smoking Status noted  Past Me  Social Hx:  reports that she has been smoking Cigarettes.  She has been smoking about 0.25 packs per day. She has never used smokeless tobacco.   Objective:   BP 100/68   Pulse 82   Temp 98.8 F (37.1 C) (Oral)   Ht 5\' 3"  (1.6 m)   Wt 154 lb 12.8 oz (70.2 kg)   SpO2 99%   BMI 27.42 kg/m  Physical Exam  Gen: NAD, alert, cooperative with exam, ill and tired appearing HEENT: NCAT, PERRL, clear conjunctiva, oropharynx clear, supple neck Cardiac: Regular rate and rhythm, normal S1/S2, no murmur, no edema, capillary refill brisk  Respiratory: Clear to auscultation bilaterally, no wheezes, non-labored breathing Gastrointestinal: soft, non tender, non distended, bowel sounds present Skin: no rashes, normal turgor  Neurological: no gross deficits.  Psych: good insight, normal mood and affect   Assessment & Plan:  URI, acute Symptoms seem to be flulike. Check patient for influenza will she was at clinic. Due to flulike symptoms  that have only been over the last 36 hours and history of asthma, will go ahead and treat with Tamiflu for 5 day course. We'll follow up with flu results. Symptomatic therapy at this time including  plenty of rest, fluids, warm tea with honey, cough drops. Discussed return precautions with patient. Note provided for work.    Anders Simmondshristina Gambino, MD [facility], PGY-2

## 2016-06-30 NOTE — Patient Instructions (Addendum)
Thank you for coming in today, it was so nice to see you! Today we talked about:    Upper respiratory virus: Please take 400 mg of Ibuprofen every 6 hours for the next couple days for body aches and fever. Get plenty of rest, drink warm tea with honey, and plenty of fluids. Take Tamiflu as prescribed.   Go to the hospital for chest pain, shortness of breath, or wheezing  If you have any questions or concerns, please do not hesitate to call the office at [phone]. You can also message me directly via MyChart.   Sincerely,  Anders Simmondshristina Clementina Mareno, MD    Upper Respiratory Infection, Adult Most upper respiratory infections (URIs) are caused by a virus. A URI affects the nose, throat, and upper air passages. The most common type of URI is often called "the common cold." Follow these instructions at home:  Take medicines only as told by your doctor.  Gargle warm saltwater or take cough drops to comfort your throat as told by your doctor.  Use a warm mist humidifier or inhale steam from a shower to increase air moisture. This may make it easier to breathe.  Drink enough fluid to keep your pee (urine) clear or pale yellow.  Eat soups and other clear broths.  Have a healthy diet.  Rest as needed.  Go back to work when your fever is gone or your doctor says it is okay.  You may need to stay home longer to avoid giving your URI to others.  You can also wear a face mask and wash your hands often to prevent spread of the virus.  Use your inhaler more if you have asthma.  Do not use any tobacco products, including cigarettes, chewing tobacco, or electronic cigarettes. If you need help quitting, ask your doctor. Contact a doctor if:  You are getting worse, not better.  Your symptoms are not helped by medicine.  You have chills.  You are getting more short of breath.  You have brown or red mucus.  You have yellow or brown discharge from your nose.  You have pain in your face,  especially when you bend forward.  You have a fever.  You have puffy (swollen) neck glands.  You have pain while swallowing.  You have white areas in the back of your throat. Get help right away if:  You have very bad or constant:  Headache.  Ear pain.  Pain in your forehead, behind your eyes, and over your cheekbones (sinus pain).  Chest pain.  You have long-lasting (chronic) lung disease and any of the following:  Wheezing.  Long-lasting cough.  Coughing up blood.  A change in your usual mucus.  You have a stiff neck.  You have changes in your:  Vision.  Hearing.  Thinking.  Mood. This information is not intended to replace advice given to you by your health care provider. Make sure you discuss any questions you have with your health care provider. Document Released: 11/12/2007 Document Revised: 01/27/2016 Document Reviewed: 08/31/2013 Elsevier Interactive Patient Education  2017 [employer]

## 2016-06-30 NOTE — Assessment & Plan Note (Addendum)
Symptoms seem to be flulike. Check patient for influenza will she was at clinic. Due to flulike symptoms  that have only been over the last 36 hours and history of asthma, will go ahead and treat with Tamiflu for 5 day course. We'll follow up with flu results. Symptomatic therapy at this time including plenty of rest, fluids, warm tea with honey, cough drops. Discussed return precautions with patient. Note provided for work.

## 2016-11-01 ENCOUNTER — Ambulatory Visit (HOSPITAL_COMMUNITY)
Admission: EM | Admit: 2016-11-01 | Discharge: 2016-11-01 | Disposition: A | Discharge status: Home / Self Care | Payer: Medicaid Other | Attending: Family Medicine | Admitting: Family Medicine

## 2016-11-01 ENCOUNTER — Encounter (HOSPITAL_COMMUNITY): Payer: Self-pay | Admitting: *Deleted

## 2016-11-01 DIAGNOSIS — M7701 Medial epicondylitis, right elbow: Secondary | ICD-10-CM | POA: Diagnosis not present

## 2016-11-01 DIAGNOSIS — L089 Local infection of the skin and subcutaneous tissue, unspecified: Secondary | ICD-10-CM

## 2016-11-01 DIAGNOSIS — M79661 Pain in right lower leg: Secondary | ICD-10-CM | POA: Diagnosis not present

## 2016-11-01 MED ORDER — CLINDAMYCIN HCL 300 MG PO CAPS: 300.0000 mg | ORAL_CAPSULE | Freq: Three times a day (TID) | ORAL | 0 refills | Status: DC

## 2016-11-01 NOTE — ED Notes (Signed)
Pt calls and reports she is allergic to clindamycin. This nurse calls in Bactrim DS BID for 10 days to [facility] on [address]. PT left a callback # and requested call back when med was called in. This nurse attempted to call PT back, but no one answered and voicemail is not set up for that number. PT is [facility]

## 2016-11-01 NOTE — ED Triage Notes (Signed)
Pt  Presents   With  2    Complaints    -  She  Reports  2  Days  Ago   She  Noticed  What  Appeared  To  Be  A  Small   Blister of  Finger  Of  r  Hand  She    Poked  It with a  Needle   And  The  Pain  Is  Going  Up  Her  r  Arm     She  Also  Reports  Pain in  Her r  Calf     Yesterday   After  Working   -  She  Denies   Any   Chest  Pain or  Shortness   Of  Breath  She  Denies  Any  Injury

## 2016-11-01 NOTE — ED Provider Notes (Addendum)
CSN: [id number]     Arrival date & time 11/01/16  1339 History   First MD Initiated Contact with Patient 11/01/16 1413     Chief Complaint  Patient presents with  . Arm Problem   (Consider location/radiation/quality/duration/timing/severity/associated sxs/prior Treatment) 31-year-old female with 3 complaints today. She is complaining of pain to the right middle finger. It started approximately 2 days ago. The initial sign was a small vesicle at the tip of the finger radial side. She popped it with AP and. Since that time she has noticed some swelling along the distal phalanx, lesser to the middle phalanx. She states it is becoming a little red.  Second complaint is that of pain to the medial aspect of the right elbow. Sometimes worse with movement. No history of trauma.  Third complaint is that of right calf pain for a day or 2. She states it is sore to walk on. No known trauma but she is on her feet for several hours a day at work. It is worse with walking. She has noticed no swelling or discoloration.      Past Medical History:  Diagnosis Date  . Adenomyosis 2012   Planning hysterectomy   . ADHD (attention deficit hyperactivity disorder)    NO MEDS  . 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 Asthma 1988  . Thyromegaly    H/O   Past Surgical History:  Procedure Laterality Date  . ENDOMETRIAL ABLATION  2011  . INCISE AND DRAIN ABCESS     surgical incision, wound produced mrsa  . TUBAL LIGATION  2010   Family History  Problem Relation Age of Onset  . Alzheimer's disease Maternal Grandfather   . Depression Sister   . Depression Mother   . Diabetes Father   . Ovarian cancer Unknown        maternal great grandmother  . Uterine cancer Unknown        maternal great aunt  . Testicular cancer Cousin        maternal cousin   Social History  Substance Use Topics  . Smoking status: Current Some Day Smoker    Packs/day: 0.25    Types: Cigarettes  . Smokeless tobacco: Never  Used  . Alcohol use 0.6 oz/week    1 Cans of beer per week     Comment: ocassionally   OB History    Gravida Para Term Preterm AB Living   3 3 3     3    SAB TAB Ectopic Multiple Live Births           3     Review of Systems  Constitutional: Negative.  Negative for fever.  HENT: Negative.   Respiratory: Negative.   Cardiovascular: Negative.   Genitourinary: Negative.   Musculoskeletal:       As per history of present illness  Skin:       As per history of present illness and regards to finger  Neurological: Negative.   All other systems reviewed and are negative.   Allergies  Ciprofloxacin; Penicillins; and Rocephin [ceftriaxone]  Home Medications   Prior to Admission medications   Medication Sig Start Date End Date Taking? Authorizing Provider  albuterol (PROVENTIL HFA;VENTOLIN HFA) 108 (90 BASE) MCG/ACT inhaler Inhale 1 puff into the lungs every 6 (six) hours as needed for wheezing or shortness of breath.    [provider]  ibuprofen (ADVIL,MOTRIN) 800 MG tablet Take 1 tablet (800 mg total) by mouth every 6 (  six) hours as needed for moderate pain. 04/24/16   Lyndal PulleyKnott, Daniel, MD  methocarbamol (ROBAXIN) 500 MG tablet Take 1 tablet (500 mg total) by mouth 2 (two) times daily. 12/30/15   Sam, Ace GinsSerena Y, PA-C  naproxen (NAPROSYN) 500 MG tablet Take 1 tablet (500 mg total) by mouth 2 (two) times daily. 12/30/15   Sam, Ace GinsSerena Y, PA-C  nitrofurantoin, macrocrystal-monohydrate, (MACROBID) 100 MG capsule Take 1 capsule (100 mg total) by mouth 2 (two) times daily. 02/24/16   Thressa ShellerHogan, Heather D, CNM  ondansetron (ZOFRAN) 4 MG tablet Take 1 tablet (4 mg total) by mouth every 6 (six) hours. 06/12/15   Nelva NayBeaton, Robert, MD   Meds Ordered and Administered this Visit  Medications - No data to display  BP 130/72 (BP Location: Right Arm)   Pulse 78   Temp 98.6 F (37 C) (Oral)   Resp 18   SpO2 100%  No data found.   Physical Exam  Constitutional: She is oriented to person, place,  and time. She appears well-developed and well-nourished. No distress.  HENT:  Head: Normocephalic and atraumatic.  Neck: Neck supple.  Cardiovascular: Normal rate.   Pulmonary/Chest: Effort normal.  Musculoskeletal: Normal range of motion. She exhibits no edema or deformity.  Right middle finger distal phalanx with minor swelling, no significant swelling to the pulp. Positive for tenderness to the radials side of the distal phalanx. No joint tenderness. Minor middle Phalen's tenderness. There is mild swelling along the length of the finger. No discoloration to the hand or forearm.  Tenderness over the medial epicondyles.  Right Exam reveals no swelling. No discoloration. Calf is soft but with mild tenderness. Pain is reproduced with toe [facility] and ambulation. No edema. Well's criteria 1.  Neurological: She is alert and oriented to person, place, and time.  Skin: Skin is warm and dry.  Psychiatric: She has a normal mood and affect.  Nursing note and vitals reviewed.   Urgent Care Course     Procedures (including critical care time)  Labs Review Labs Reviewed - No data to display  Imaging Review No results found.   Visual Acuity Review  Right Eye Distance:   Left Eye Distance:   Bilateral Distance:    Right Eye Near:   Left Eye Near:    Bilateral Near:         MDM   1. Finger infection   2. Medial epicondylitis of right elbow   3. Right calf pain    Apply ice to the elbow. Apply heat to the finger and calf muscle. There is very low risk for a blood clot in the leg and no current signs. You have some tendinitis in the right elbow causing her pain. The finger appears to be infected. Take the antibiotics as directed and use some warm compresses or soak in warm water periodically. It is getting worse particularly with redness and swelling in the pad of the finger then you will need to seek medical treatment promptly either by hand surgeon or she department. Meds  ordered this encounter  Medications  . clindamycin (CLEOCIN) 300 MG capsule    Sig: Take 1 capsule (300 mg total) by mouth 3 (three) times daily.    Dispense:  22 capsule    Refill:  0    Order Specific Question:   Supervising Provider    Answer:   Elvina SidleLAUENSTEIN, KURT [[id number]]    Some time after the patient left to get her prescription filled she called back and stated  that she remembered that she is also allergic to clindamycin. Her allergies alleviate most medications for skin infections to include good coverage for both Staphylococcus and strep. Since sulfamethoxazole and trimethoprim are mentioned in up-to-date as coverage for staph and for some stress will go ahead and prescribe that. Discontinue the clindamycin. Start Septra DS 1 twice a day for 10 days.    Hayden Rasmussen, NP 11/01/16 1453    Hayden Rasmussen, NP 11/01/16 2022

## 2016-11-01 NOTE — Discharge Instructions (Signed)
Apply ice to the elbow. Apply heat to the finger and calf muscle. There is very low risk for a blood clot in the leg and no current signs. You have some tendinitis in the right elbow causing her pain. The finger appears to be infected. Take the antibiotics as directed and use some warm compresses or soak in warm water periodically. It is getting worse particularly with redness and swelling in the pad of the finger then you will need to seek medical treatment promptly either by hand surgeon or she department.

## 2016-12-11 ENCOUNTER — Emergency Department (HOSPITAL_COMMUNITY)
Admission: EM | Admit: 2016-12-11 | Discharge: 2016-12-11 | Disposition: A | Discharge status: Home / Self Care | Payer: Medicaid Other | Attending: Emergency Medicine | Admitting: Emergency Medicine

## 2016-12-11 DIAGNOSIS — Z79899 Other long term (current) drug therapy: Secondary | ICD-10-CM | POA: Insufficient documentation

## 2016-12-11 DIAGNOSIS — J45909 Unspecified asthma, uncomplicated: Secondary | ICD-10-CM | POA: Insufficient documentation

## 2016-12-11 DIAGNOSIS — J020 Streptococcal pharyngitis: Secondary | ICD-10-CM | POA: Diagnosis not present

## 2016-12-11 DIAGNOSIS — F17219 Nicotine dependence, cigarettes, with unspecified nicotine-induced disorders: Secondary | ICD-10-CM | POA: Diagnosis not present

## 2016-12-11 DIAGNOSIS — J029 Acute pharyngitis, unspecified: Secondary | ICD-10-CM | POA: Diagnosis present

## 2016-12-11 LAB — RAPID STREP SCREEN (MED CTR MEBANE ONLY): STREPTOCOCCUS, GROUP A SCREEN (DIRECT): POSITIVE — AB

## 2016-12-11 MED ORDER — HYDROCODONE-ACETAMINOPHEN 5-325 MG PO TABS: 2.0000 | ORAL_TABLET | ORAL | 0 refills | Status: DC | PRN

## 2016-12-11 MED ORDER — AZITHROMYCIN 250 MG PO TABS: 250.0000 mg | ORAL_TABLET | Freq: Every day | ORAL | 0 refills | Status: DC

## 2016-12-11 MED ORDER — AZITHROMYCIN 250 MG PO TABS
500.0000 mg | ORAL_TABLET | Freq: Once | ORAL | Status: AC
  Administered 2016-12-11: 500 mg via ORAL
  Filled 2016-12-11: qty 2

## 2016-12-11 MED ORDER — HYDROCODONE-ACETAMINOPHEN 5-325 MG PO TABS
2.0000 | ORAL_TABLET | Freq: Once | ORAL | Status: AC
Start: 2016-12-11 — End: 2016-12-11
  Administered 2016-12-11: 2 via ORAL
  Filled 2016-12-11: qty 2

## 2016-12-11 NOTE — ED Notes (Signed)
Bed: [id number] Expected date:  Expected time:  Means of arrival:  Comments: 89 F sore throat

## 2016-12-11 NOTE — ED Provider Notes (Addendum)
[facility]-EMERGENCY DEPT Provider Note   CSN: [id number] Arrival date & time: 12/11/16  [id number]     History   Chief Complaint No chief complaint on file.   HPI [name] is a 31 y.o. female.  Complains of sore throat with pain on swallowing onset 3 days ago. No fever. She admits to sneeze no cough. No shortness of breath No other associated symptoms. Treated with ibuprofen, without relief. Brought by EMS and pain worse with swallowing not improved by anything. She  HPI  Past Medical History:  Diagnosis Date  . Adenomyosis 2012   Planning hysterectomy   . ADHD (attention deficit hyperactivity disorder)    NO MEDS  . 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 Asthma 1988  . Thyromegaly    H/O    Patient Active Problem List   Diagnosis Date Noted  . URI, acute 06/30/2016  . Sepsis secondary to UTI (HCC) 06/05/2015  . Pyelonephritis 06/02/2015  . Sepsis (HCC) 06/02/2015  . Thrombocytopenia (HCC) 06/02/2015  . AKI (acute kidney injury) (HCC) 06/02/2015  . Hypokalemia 06/02/2015  . Vomiting and diarrhea 09/03/2012  . ADHD (attention deficit hyperactivity disorder) 09/03/2012  . Chronic abdominal pain 05/27/2012  . Decreased vision 03/19/2012  . Tobacco abuse 03/19/2012  . Refusal of blood transfusions as patient is Jehovah's Witness 11/11/2011  . Adenomyosis   . Asthma     Past Surgical History:  Procedure Laterality Date  . ENDOMETRIAL ABLATION  2011  . INCISE AND DRAIN ABCESS     surgical incision, wound produced mrsa  . TUBAL LIGATION  2010    OB History    Gravida Para Term Preterm AB Living   3 3 3     3    SAB TAB Ectopic Multiple Live Births           3       Home Medications    Prior to Admission medications   Medication Sig Start Date End Date Taking? Authorizing Provider  fexofenadine (ALLEGRA) 180 MG tablet Take 180 mg by mouth daily.   Yes [provider]  albuterol (PROVENTIL HFA;VENTOLIN HFA) 108 (90 BASE) MCG/ACT inhaler Inhale 1 puff  into the lungs every 6 (six) hours as needed for wheezing or shortness of breath.    [provider]  clindamycin (CLEOCIN) 300 MG capsule Take 1 capsule (300 mg total) by mouth 3 (three) times daily. 11/01/16   Hayden RasmussenMabe, David, NP  ibuprofen (ADVIL,MOTRIN) 800 MG tablet Take 1 tablet (800 mg total) by mouth every 6 (six) hours as needed for moderate pain. 04/24/16   Lyndal PulleyKnott, Daniel, MD  methocarbamol (ROBAXIN) 500 MG tablet Take 1 tablet (500 mg total) by mouth 2 (two) times daily. 12/30/15   Taydon Nasworthy, Ace GinsSerena Y, PA-C  naproxen (NAPROSYN) 500 MG tablet Take 1 tablet (500 mg total) by mouth 2 (two) times daily. 12/30/15   Destini Cambre, Ace GinsSerena Y, PA-C  nitrofurantoin, macrocrystal-monohydrate, (MACROBID) 100 MG capsule Take 1 capsule (100 mg total) by mouth 2 (two) times daily. 02/24/16   Thressa ShellerHogan, Heather D, CNM  ondansetron (ZOFRAN) 4 MG tablet Take 1 tablet (4 mg total) by mouth every 6 (six) hours. 06/12/15   Nelva NayBeaton, Robert, MD    Family History Family History  Problem Relation Age of Onset  . Alzheimer's disease Maternal Grandfather   . Depression Sister   . Depression Mother   . Diabetes Father   . Ovarian cancer Unknown        maternal great  grandmother  . Uterine cancer Unknown        maternal great aunt  . Testicular cancer Cousin        maternal cousin    Social History Social History  Substance Use Topics  . Smoking status: Current Some Day Smoker    Packs/day: 0.25    Types: Cigarettes  . Smokeless tobacco: Never Used  . Alcohol use 0.6 oz/week    1 Cans of beer per week     Comment: ocassionally     Allergies   Ciprofloxacin; Clindamycin/lincomycin; Penicillins; and Rocephin [ceftriaxone]   Review of Systems Review of Systems  Constitutional: Negative.   HENT: Positive for sneezing, sore throat and trouble swallowing.   Respiratory: Negative.   Cardiovascular: Negative.   Gastrointestinal: Negative.   Musculoskeletal: Negative.   Skin: Negative.   Neurological: Negative.     Psychiatric/Behavioral: Negative.   All other systems reviewed and are negative.    Physical Exam Updated Vital Signs BP (!) 113/59 (BP Location: Left Arm)   Pulse 98   Temp 100.2 F (37.9 C) (Oral)   Resp 18   SpO2 100%   Physical Exam  Constitutional: She appears well-developed and well-nourished. No distress.  Alert nontoxic handling secretions well  HENT:  Head: Normocephalic and atraumatic.  Right Ear: External ear normal.  Left Ear: External ear normal.  Mouth/Throat: Oropharyngeal exudate present.  Bilateral tympanic membranes normal. Uvula midline. White tonsillar exudate  Eyes: Conjunctivae are normal. Pupils are equal, round, and reactive to light.  Neck: Neck supple. No tracheal deviation present. No thyromegaly present.  Cardiovascular: Normal rate and regular rhythm.   No murmur heard. Pulmonary/Chest: Effort normal and breath sounds normal.  Abdominal: Soft. Bowel sounds are normal. She exhibits no distension. There is no tenderness.  Musculoskeletal: Normal range of motion. She exhibits no edema or tenderness.  Neurological: She is alert. Coordination normal.  Skin: Skin is warm and dry. No rash noted.  Psychiatric: She has a normal mood and affect.  Nursing note and vitals reviewed.    ED Treatments / Results  Labs (all labs ordered are listed, but only abnormal results are displayed) Labs Reviewed  RAPID STREP SCREEN (NOT AT [facility])    EKG  EKG Interpretation None      Results for orders placed or performed during the hospital encounter of 12/11/16  Rapid strep screen (not at [facility])  Result Value Ref Range   Streptococcus, Group A Screen (Direct) POSITIVE (A) NEGATIVE   No results found.  Radiology No results found.  Procedures Procedures (including critical care time)  Medications Ordered in ED Medications  HYDROcodone-acetaminophen (NORCO/VICODIN) 5-325 MG per tablet 2 tablet (2 tablets Oral Given 12/11/16 0933)     Initial  Impression / Assessment and Plan / ED Course  I have reviewed the triage vital signs and the nursing notes.  Pertinent labs & imaging results that were available during my care of the patient were reviewed by me and considered in my medical decision making (see chart for details).     10:10 AM pain improved after treatment with Norco. She is able to drink without difficulty. Plan prescription Norco, Zithromax. [place] Controlled Substance reporting System queried.First dose antibiotic given here. Follow-up with PMD if not improving in 4 days.  Final diagnoses:  None  Final diagnosis: Strep pharyngitis  New Prescsritionns New Prescriptions   No medications on file     Doug Sou, MD 12/11/16 1013    Doug Sou, MD 12/11/16  1015  

## 2016-12-11 NOTE — ED Triage Notes (Signed)
Per EMS, pt coming from home with complaints of sore throat x3 days. Denies fever, cough, and n/v/d. Pt reports not eating much due to pain with swallowing. Pt has been taking ibuprofen and allergy medication to help with the symptoms and has no relief. EMS reports that all lung sounds are clear with no SOB. Pt ambulatory.

## 2016-12-11 NOTE — Discharge Instructions (Signed)
Take Advil for mild pain or the pain medicine prescribed for bad pain.Make sure that you drink at least six 8 ounce glasses of water or Gatorade each day in order to stay well-hydrated. See your primary care physician if you don't feel well enough to go back to work by 12/15/2016. Return if concern for any reason

## 2017-06-12 ENCOUNTER — Ambulatory Visit: Payer: Self-pay | Admitting: Family Medicine

## 2017-06-15 ENCOUNTER — Ambulatory Visit: Payer: Self-pay | Admitting: Internal Medicine

## 2017-08-25 ENCOUNTER — Emergency Department (HOSPITAL_COMMUNITY): Payer: Medicaid Other

## 2017-08-25 ENCOUNTER — Encounter (HOSPITAL_COMMUNITY): Payer: Self-pay | Admitting: *Deleted

## 2017-08-25 ENCOUNTER — Other Ambulatory Visit: Payer: Self-pay

## 2017-08-25 ENCOUNTER — Emergency Department (HOSPITAL_COMMUNITY)
Admission: EM | Admit: 2017-08-25 | Discharge: 2017-08-25 | Disposition: A | Discharge status: Home / Self Care | Payer: Medicaid Other | Attending: Emergency Medicine | Admitting: Emergency Medicine

## 2017-08-25 DIAGNOSIS — F17210 Nicotine dependence, cigarettes, uncomplicated: Secondary | ICD-10-CM | POA: Diagnosis not present

## 2017-08-25 DIAGNOSIS — B9789 Other viral agents as the cause of diseases classified elsewhere: Secondary | ICD-10-CM | POA: Diagnosis not present

## 2017-08-25 DIAGNOSIS — Z79899 Other long term (current) drug therapy: Secondary | ICD-10-CM | POA: Diagnosis not present

## 2017-08-25 DIAGNOSIS — R05 Cough: Secondary | ICD-10-CM | POA: Diagnosis present

## 2017-08-25 DIAGNOSIS — J45909 Unspecified asthma, uncomplicated: Secondary | ICD-10-CM | POA: Insufficient documentation

## 2017-08-25 DIAGNOSIS — J069 Acute upper respiratory infection, unspecified: Secondary | ICD-10-CM | POA: Insufficient documentation

## 2017-08-25 MED ORDER — BENZONATATE 100 MG PO CAPS: 100.0000 mg | ORAL_CAPSULE | Freq: Three times a day (TID) | ORAL | 0 refills | Status: DC

## 2017-08-25 NOTE — Discharge Instructions (Signed)
Please read attached information. If you experience any new or worsening signs or symptoms please return to the emergency room for evaluation. Please follow-up with your primary care provider or specialist as discussed. Please use medication prescribed only as directed and discontinue taking if you have any concerning signs or symptoms.   °

## 2017-08-25 NOTE — ED Provider Notes (Signed)
[facility] Note   CSN: [id number] Arrival date & time: 08/25/17  1120   History   Chief Complaint Chief Complaint  Patient presents with  . Cough  . Influenza    HPI [name] is a 32 y.o. female.  HPI   32 year old female presents today with complaints of upper respiratory infection.  Patient notes symptoms started 5 days ago with fatigue, sore throat and nasal congestion.  She notes she continued to progress with body aches cough.  Patient notes she originally had a fever but has not had a fever in the last 3 days.  Patient reports originally she was feeling worse than she is today but still continues to have remaining symptoms.  Patient reports that she is a smoker, but has not been smoking throughout this.  Patient notes she has a history of asthma but has not needed her inhaler.  He notes she has been using over-the-counter cough and cold medication.  Patient reports that she does not want to smoke anymore, reports that she has tried to quit smoking in the past.  She notes that even the smell of smoke now makes her nauseous.  She has not tried any medications to assist with helping smoking.  Past Medical History:  Diagnosis Date  . Adenomyosis 2012   Planning hysterectomy   . ADHD (attention deficit hyperactivity disorder)    NO MEDS  . 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 Asthma 1988  . Thyromegaly    H/O    Patient Active Problem List   Diagnosis Date Noted  . URI, acute 06/30/2016  . Sepsis secondary to UTI (HCC) 06/05/2015  . Pyelonephritis 06/02/2015  . Sepsis (HCC) 06/02/2015  . Thrombocytopenia (HCC) 06/02/2015  . AKI (acute kidney injury) (HCC) 06/02/2015  . Hypokalemia 06/02/2015  . Vomiting and diarrhea 09/03/2012  . ADHD (attention deficit hyperactivity disorder) 09/03/2012  . Chronic abdominal pain 05/27/2012  . Decreased vision 03/19/2012  . Tobacco abuse 03/19/2012  . Refusal of blood  transfusions as patient is Jehovah's Witness 11/11/2011  . Adenomyosis   . Asthma     Past Surgical History:  Procedure Laterality Date  . ENDOMETRIAL ABLATION  2011  . INCISE AND DRAIN ABCESS     surgical incision, wound produced mrsa  . TUBAL LIGATION  2010    OB History    Gravida Para Term Preterm AB Living   3 3 3     3    SAB TAB Ectopic Multiple Live Births           3       Home Medications    Prior to Admission medications   Medication Sig Start Date End Date Taking? Authorizing Provider  albuterol (PROVENTIL HFA;VENTOLIN HFA) 108 (90 BASE) MCG/ACT inhaler Inhale 1 puff into the lungs every 6 (six) hours as needed for wheezing or shortness of breath.    [provider]  azithromycin (ZITHROMAX) 250 MG tablet Take 1 tablet (250 mg total) by mouth daily. 1 tablet daily starting 12/12/2016 12/11/16   Doug SouJacubowitz, Sam, MD  benzonatate (TESSALON) 100 MG capsule Take 1 capsule (100 mg total) by mouth every 8 (eight) hours. 08/25/17   Stepheni Cameron, Tinnie GensJeffrey, PA-C  diphenhydrAMINE (BENADRYL) 25 mg capsule Take 25 mg by mouth every 6 (six) hours as needed.    [provider]  fexofenadine (ALLEGRA) 180 MG tablet Take 180 mg by mouth daily.    [provider]  HYDROcodone-acetaminophen (NORCO) 5-325 MG tablet Take 2 tablets by mouth every 4 (four) hours as needed. 12/11/16   Doug Sou, MD  ibuprofen (ADVIL,MOTRIN) 200 MG tablet Take 1,000 mg by mouth every 6 (six) hours as needed.    [provider]    Family History Family History  Problem Relation Age of Onset  . Alzheimer's disease Maternal Grandfather   . Depression Sister   . Depression Mother   . Diabetes Father   . Ovarian cancer Unknown        maternal great grandmother  . Uterine cancer Unknown        maternal great aunt  . Testicular cancer Cousin        maternal cousin    Social History Social History   Tobacco Use  . Smoking status: Current Some Day Smoker    Packs/day:  0.25    Types: Cigarettes  . Smokeless tobacco: Never Used  Substance Use Topics  . Alcohol use: Yes    Alcohol/week: 0.6 oz    Types: 1 Cans of beer per week    Comment: ocassionally  . Drug use: No     Allergies   Ciprofloxacin; Clindamycin/lincomycin; Penicillins; and Rocephin [ceftriaxone]   Review of Systems Review of Systems  All other systems reviewed and are negative.    Physical Exam Updated Vital Signs BP 109/69 (BP Location: Right Arm)   Pulse 94   Temp 99.2 F (37.3 C) (Oral)   Resp 18   Ht 5' 3.5" (1.613 m)   SpO2 100%   BMI 26.99 kg/m   Physical Exam  Constitutional: She is oriented to person, place, and time. She appears well-developed and well-nourished.  HENT:  Head: Normocephalic and atraumatic.  Right Ear: Hearing, tympanic membrane and ear canal normal.  Left Ear: Hearing, tympanic membrane and ear canal normal.  Nose: Rhinorrhea present.  Mouth/Throat: Uvula is midline and oropharynx is clear and moist. No oropharyngeal exudate, posterior oropharyngeal edema, posterior oropharyngeal erythema or tonsillar abscesses.  Eyes: Conjunctivae are normal. Pupils are equal, round, and reactive to light. Right eye exhibits no discharge. Left eye exhibits no discharge. No scleral icterus.  Neck: Normal range of motion. No JVD present. No tracheal deviation present.  Pulmonary/Chest: Effort normal and breath sounds normal. No stridor. No respiratory distress. She has no wheezes. She has no rales. She exhibits no tenderness.  Neurological: She is alert and oriented to person, place, and time. Coordination normal.  Psychiatric: She has a normal mood and affect. Her behavior is normal. Judgment and thought content normal.  Nursing note and vitals reviewed.   ED Treatments / Results  Labs (all labs ordered are listed, but only abnormal results are displayed) Labs Reviewed - No data to display  EKG  EKG Interpretation None       Radiology Dg Chest 2  View  Result Date: 08/25/2017 CLINICAL DATA:  Cough and fever EXAM: CHEST - 2 VIEW COMPARISON:  06/02/2015 FINDINGS: The heart size and mediastinal contours are within normal limits. Both lungs are clear. The visualized skeletal structures are unremarkable. IMPRESSION: No active cardiopulmonary disease. Electronically Signed   By: Marlan Palau M.D.   On: 08/25/2017 12:53    Procedures Procedures (including critical care time)  Medications Ordered in ED Medications - No data to display   Initial Impression / Assessment and Plan / ED Course  I have reviewed the triage vital signs and the nursing notes.  Pertinent labs & imaging results that were available during  my care of the patient were reviewed by me and considered in my medical decision making (see chart for details).      Final Clinical Impressions(s) / ED Diagnoses   Final diagnoses:  Viral URI with cough    Labs:   Imaging: DG chest 2 view-no acute findings  Consults:  Therapeutics:  Discharge Meds: Tessalon  Assessment/Plan: 32 year old female presents today with likely viral URI.  Question influenza.  Patient is already improving and is very well-appearing.  She has clear lung sounds and negative chest x-ray and reassuring oxygen saturation.  I do not feel testing or treating for influenza would be beneficial at this time.  Patient will continue symptomatic care, she will follow-up with the primary care provider in 3 days return to emergency room if develops any new or worsening signs or symptoms.  She verbalized understanding and agreement to today's plan had no further questions or concerns.  Discussed smoking cessation with patient and was they were offerred resources to help stop.  Total time was 5 min CPT code 16109.       ED Discharge Orders        Ordered    benzonatate (TESSALON) 100 MG capsule  Every 8 hours     08/25/17 1509       Eyvonne Mechanic, PA-C 08/25/17 1515    Gerhard Munch,  MD 08/25/17 1901

## 2017-08-25 NOTE — ED Triage Notes (Signed)
Pt reports having productive cough, fever, bodyaches and headache x 5 days.

## 2017-09-30 DIAGNOSIS — J069 Acute upper respiratory infection, unspecified: Secondary | ICD-10-CM | POA: Diagnosis not present

## 2017-09-30 DIAGNOSIS — J029 Acute pharyngitis, unspecified: Secondary | ICD-10-CM | POA: Diagnosis not present

## 2018-06-07 DIAGNOSIS — R1030 Lower abdominal pain, unspecified: Secondary | ICD-10-CM | POA: Diagnosis not present

## 2018-06-25 ENCOUNTER — Emergency Department (HOSPITAL_COMMUNITY)
Admission: EM | Admit: 2018-06-25 | Discharge: 2018-06-25 | Disposition: A | Discharge status: Home / Self Care | Payer: Medicaid Other | Attending: Emergency Medicine | Admitting: Emergency Medicine

## 2018-06-25 ENCOUNTER — Other Ambulatory Visit: Payer: Self-pay

## 2018-06-25 ENCOUNTER — Encounter (HOSPITAL_COMMUNITY): Payer: Self-pay | Admitting: Emergency Medicine

## 2018-06-25 DIAGNOSIS — J45909 Unspecified asthma, uncomplicated: Secondary | ICD-10-CM | POA: Insufficient documentation

## 2018-06-25 DIAGNOSIS — M5431 Sciatica, right side: Secondary | ICD-10-CM | POA: Insufficient documentation

## 2018-06-25 DIAGNOSIS — R1031 Right lower quadrant pain: Secondary | ICD-10-CM | POA: Diagnosis present

## 2018-06-25 DIAGNOSIS — Z79899 Other long term (current) drug therapy: Secondary | ICD-10-CM | POA: Insufficient documentation

## 2018-06-25 DIAGNOSIS — F17210 Nicotine dependence, cigarettes, uncomplicated: Secondary | ICD-10-CM | POA: Diagnosis not present

## 2018-06-25 LAB — POC URINE PREG, ED: Preg Test, Ur: NEGATIVE

## 2018-06-25 MED ORDER — METHYLPREDNISOLONE 4 MG PO TBPK: ORAL_TABLET | ORAL | 0 refills | Status: DC

## 2018-06-25 MED ORDER — METHOCARBAMOL 500 MG PO TABS: 500.0000 mg | ORAL_TABLET | Freq: Two times a day (BID) | ORAL | 0 refills | Status: DC

## 2018-06-25 NOTE — ED Notes (Signed)
POC documented as Negative

## 2018-06-25 NOTE — Discharge Instructions (Signed)
Return to ED for worsening symptoms, injuries or falls, losing control of your bowels or bladder, numbness in arms or legs or chest pain. Take the entire dose of steroids to help with your symptoms.
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
## 2018-07-05 ENCOUNTER — Ambulatory Visit (INDEPENDENT_AMBULATORY_CARE_PROVIDER_SITE_OTHER): Payer: Medicaid Other | Admitting: Family Medicine

## 2018-07-05 DIAGNOSIS — M545 Low back pain: Secondary | ICD-10-CM

## 2018-07-05 NOTE — Progress Notes (Signed)
  Patient Name: [name] Date of Birth: [birth year] Date of Visit: 07/05/18 PCP: Leland Her, DO  Chief Complaint: imaging   Subjective: [name] is a pleasant 33 y.o. presenting today for an MRI.  She reports she was told in the emergency room and by the scheduling staff that she would undergo advanced imaging today with an MRI.  She would like to leave if she is going to have a regular appointment and not undergo imaging. She does not wish to discuss her symptoms further.    Attempted to engage the patient in discussion about her back pain and learn more about her signs and symptoms.  The patient declined.  She reports she has to return to work.  She states she does not have time for further evaluation.  She would only like to have her back imaged with an MRI.  Patient left the clinic without further evaluation and declined further evaluation and discussion.  Terisa Starr, MD  Family Medicine Teaching Service

## 2018-08-27 ENCOUNTER — Telehealth: Payer: Self-pay | Admitting: Family Medicine

## 2018-08-27 NOTE — Telephone Encounter (Signed)
Telephone Encounter  PCP: Leland Her, DO Historian/Reporter: Self Call type: incoming 08/27/2018, 4:18 PM   CC: Allergy Symptoms    [name] is a 33 y.o. female with past medical history significant for asthma and seasonal allergies.  Patient presented at work to start at 3 pm with symptoms of runny nose, congestion, bilateral eye injection and patient told to go to doctor's office to be seen prior to coming into work.  She denies fever, cough and believes that her symptoms are simply due to allergies.  Plan Patient symptoms are more consistent with allergies.  and patient is not hypoxic, and vital signs are stable.  Unfortunately, at this time, cannot do rule out testing of coronavirus.  Attempted to call store to speak to manager about what they would need for her to return to work, unfortunately there was no answer.  Patient informed that we are unable to send a letter stating negative or rule out of coronavirus. Patient was not seen by provider as there would be no treatment or changes in next steps for this patient.  Provided patient with letter at her request for confirmation that she attempted to seek care.  Patient agreeable to plan.   Genia Hotter, M.D. [facility]  PGY -1 08/27/2018, 4:18 PM

## 2018-09-17 ENCOUNTER — Other Ambulatory Visit: Payer: Self-pay

## 2018-09-17 ENCOUNTER — Encounter (HOSPITAL_COMMUNITY): Payer: Self-pay | Admitting: *Deleted

## 2018-09-17 ENCOUNTER — Emergency Department (HOSPITAL_COMMUNITY): Payer: Medicaid Other

## 2018-09-17 ENCOUNTER — Emergency Department (HOSPITAL_COMMUNITY)
Admission: EM | Admit: 2018-09-17 | Discharge: 2018-09-17 | Disposition: A | Discharge status: Home / Self Care | Payer: Medicaid Other | Attending: Emergency Medicine | Admitting: Emergency Medicine

## 2018-09-17 DIAGNOSIS — J45909 Unspecified asthma, uncomplicated: Secondary | ICD-10-CM | POA: Diagnosis not present

## 2018-09-17 DIAGNOSIS — F17210 Nicotine dependence, cigarettes, uncomplicated: Secondary | ICD-10-CM | POA: Diagnosis not present

## 2018-09-17 DIAGNOSIS — Z79899 Other long term (current) drug therapy: Secondary | ICD-10-CM | POA: Diagnosis not present

## 2018-09-17 DIAGNOSIS — R3121 Asymptomatic microscopic hematuria: Secondary | ICD-10-CM | POA: Insufficient documentation

## 2018-09-17 DIAGNOSIS — R110 Nausea: Secondary | ICD-10-CM | POA: Diagnosis not present

## 2018-09-17 DIAGNOSIS — R1084 Generalized abdominal pain: Secondary | ICD-10-CM | POA: Diagnosis not present

## 2018-09-17 DIAGNOSIS — R52 Pain, unspecified: Secondary | ICD-10-CM | POA: Diagnosis not present

## 2018-09-17 DIAGNOSIS — R1032 Left lower quadrant pain: Secondary | ICD-10-CM | POA: Diagnosis not present

## 2018-09-17 LAB — URINALYSIS, ROUTINE W REFLEX MICROSCOPIC
Bacteria, UA: NONE SEEN
Bilirubin Urine: NEGATIVE
Glucose, UA: NEGATIVE mg/dL
Ketones, ur: 5 mg/dL — AB
Leukocytes,Ua: NEGATIVE
Nitrite: NEGATIVE
Protein, ur: NEGATIVE mg/dL
Specific Gravity, Urine: 1.021 (ref 1.005–1.030)
pH: 6 (ref 5.0–8.0)

## 2018-09-17 LAB — WET PREP, GENITAL
Sperm: NONE SEEN
Trich, Wet Prep: NONE SEEN
WBC, Wet Prep HPF POC: NONE SEEN
Yeast Wet Prep HPF POC: NONE SEEN

## 2018-09-17 LAB — POC URINE PREG, ED: Preg Test, Ur: NEGATIVE

## 2018-09-17 LAB — CBC WITH DIFFERENTIAL/PLATELET
Abs Immature Granulocytes: 0.01 10*3/uL (ref 0.00–0.07)
Basophils Absolute: 0.1 10*3/uL (ref 0.0–0.1)
Basophils Relative: 1 %
Eosinophils Absolute: 0.6 10*3/uL — ABNORMAL HIGH (ref 0.0–0.5)
Eosinophils Relative: 7 %
HCT: 43.3 % (ref 36.0–46.0)
Hemoglobin: 14.7 g/dL (ref 12.0–15.0)
Immature Granulocytes: 0 %
Lymphocytes Relative: 32 %
Lymphs Abs: 2.6 10*3/uL (ref 0.7–4.0)
MCH: 33.1 pg (ref 26.0–34.0)
MCHC: 33.9 g/dL (ref 30.0–36.0)
MCV: 97.5 fL (ref 80.0–100.0)
Monocytes Absolute: 0.6 10*3/uL (ref 0.1–1.0)
Monocytes Relative: 7 %
Neutro Abs: 4.2 10*3/uL (ref 1.7–7.7)
Neutrophils Relative %: 53 %
Platelets: 376 10*3/uL (ref 150–400)
RBC: 4.44 MIL/uL (ref 3.87–5.11)
RDW: 12.5 % (ref 11.5–15.5)
WBC: 8.1 10*3/uL (ref 4.0–10.5)
nRBC: 0 % (ref 0.0–0.2)

## 2018-09-17 LAB — COMPREHENSIVE METABOLIC PANEL
ALT: 15 U/L (ref 0–44)
AST: 15 U/L (ref 15–41)
Albumin: 4.2 g/dL (ref 3.5–5.0)
Alkaline Phosphatase: 79 U/L (ref 38–126)
Anion gap: 10 (ref 5–15)
BUN: 9 mg/dL (ref 6–20)
CO2: 25 mmol/L (ref 22–32)
Calcium: 9.2 mg/dL (ref 8.9–10.3)
Chloride: 103 mmol/L (ref 98–111)
Creatinine, Ser: 0.85 mg/dL (ref 0.44–1.00)
GFR calc Af Amer: >60 mL/min (ref >60)
GFR calc non Af Amer: >60 mL/min (ref >60)
Glucose, Bld: 93 mg/dL (ref 70–99)
Potassium: 3.5 mmol/L (ref 3.5–5.1)
Sodium: 138 mmol/L (ref 135–145)
Total Bilirubin: 0.6 mg/dL (ref 0.3–1.2)
Total Protein: 7.9 g/dL (ref 6.5–8.1)

## 2018-09-17 LAB — LIPASE, BLOOD: Lipase: 22 U/L (ref 11–51)

## 2018-09-17 MED ORDER — IBUPROFEN 600 MG PO TABS: 600.0000 mg | ORAL_TABLET | Freq: Four times a day (QID) | ORAL | 0 refills | Status: DC | PRN

## 2018-09-17 MED ORDER — KETOROLAC TROMETHAMINE 30 MG/ML IJ SOLN
30.0000 mg | Freq: Once | INTRAMUSCULAR | Status: AC
  Administered 2018-09-17: 14:00:00 30 mg via INTRAVENOUS
  Filled 2018-09-17: qty 1

## 2018-09-17 NOTE — ED Notes (Signed)
Pt aware of need for urine sample but states she voided prior to calling EMS.

## 2018-09-17 NOTE — ED Notes (Signed)
ED Provider at bedside. 

## 2018-09-17 NOTE — Discharge Instructions (Addendum)
Take ibuprofen as needed for pain.  Follow up with your doctor to have your urine recheck as there are evidence of blood in it today.  Return if your condition worsen or if you have other concerns.

## 2018-09-17 NOTE — ED Provider Notes (Signed)
[facility]-EMERGENCY DEPT Provider Note   CSN: [id number] Arrival date & time: 09/17/18  1300    History   Chief Complaint Chief Complaint  Patient presents with   Abdominal Pain    HPI [name] is a 33 y.o. female.     The history is provided by the patient and medical records. No language interpreter was used.  Abdominal Pain     33 year old female with history of chronic abdominal pain, asthma, ADHD, recurrent UTI here via EMS from home for evaluation of abdominal pain.  For the past 2 days patient has had pain to her left lower abdomen/pelvic region.  Pain is described as a sharp and crampy sensation, persistent, worsening with movement and moderate in severity.  There are no associated fever chills, no chest pain shortness of breath productive cough, no vaginal bleeding vaginal discharge or dysuria.  No report of nausea vomiting or diarrhea.  She denies any new sexual partners.  She tries over-the-counter medication at home with minimal improvement.  She report endometrial ablation 8 years ago.  Past Medical History:  Diagnosis Date   Adenomyosis 2012   Planning hysterectomy    ADHD (attention deficit hyperactivity disorder)    NO MEDS   Allergy 1988   Hay fever since birth    Asthma 371988   Thyromegaly    H/O    Patient Active Problem List   Diagnosis Date Noted   URI, acute 06/30/2016   Sepsis secondary to UTI (HCC) 06/05/2015   Pyelonephritis 06/02/2015   Sepsis (HCC) 06/02/2015   Thrombocytopenia (HCC) 06/02/2015   AKI (acute kidney injury) (HCC) 06/02/2015   Hypokalemia 06/02/2015   Vomiting and diarrhea 09/03/2012   ADHD (attention deficit hyperactivity disorder) 09/03/2012   Chronic abdominal pain 05/27/2012   Decreased vision 03/19/2012   Tobacco abuse 03/19/2012   Refusal of blood transfusions as patient is Jehovah's Witness 11/11/2011   Adenomyosis    Asthma     Past Surgical History:  Procedure  Laterality Date   ENDOMETRIAL ABLATION  2011   INCISE AND DRAIN ABCESS     surgical incision, wound produced mrsa   TUBAL LIGATION  2010     OB History    Gravida  3   Para  3   Term  3   Preterm      AB      Living  3     SAB      TAB      Ectopic      Multiple      Live Births  3            Home Medications    Prior to Admission medications   Medication Sig Start Date End Date Taking? Authorizing Provider  albuterol (PROVENTIL HFA;VENTOLIN HFA) 108 (90 BASE) MCG/ACT inhaler Inhale 1 puff into the lungs every 6 (six) hours as needed for wheezing or shortness of breath.    [provider]  azithromycin (ZITHROMAX) 250 MG tablet Take 1 tablet (250 mg total) by mouth daily. 1 tablet daily starting 12/12/2016 12/11/16   Doug SouJacubowitz, Sam, MD  benzonatate (TESSALON) 100 MG capsule Take 1 capsule (100 mg total) by mouth every 8 (eight) hours. 08/25/17   Hedges, Tinnie GensJeffrey, PA-C  diphenhydrAMINE (BENADRYL) 25 mg capsule Take 25 mg by mouth every 6 (six) hours as needed.    [provider]  fexofenadine (ALLEGRA) 180 MG tablet Take 180 mg by mouth daily.    [provider]  HYDROcodone-acetaminophen (NORCO) 5-325 MG tablet Take 2 tablets by mouth every 4 (four) hours as needed. 12/11/16   Doug Sou, MD  ibuprofen (ADVIL,MOTRIN) 200 MG tablet Take 1,000 mg by mouth every 6 (six) hours as needed.    [provider]  methocarbamol (ROBAXIN) 500 MG tablet Take 1 tablet (500 mg total) by mouth 2 (two) times daily. 06/25/18   Khatri, Hina, PA-C  methylPREDNISolone (MEDROL DOSEPAK) 4 MG TBPK tablet Taper over 6 days. 06/25/18   Dietrich Pates, PA-C    Family History Family History  Problem Relation Age of Onset   Alzheimer's disease Maternal Grandfather    Depression Sister    Depression Mother    Diabetes Father    Ovarian cancer Other        maternal great grandmother   Uterine cancer Other        maternal great aunt    Testicular cancer Cousin        maternal cousin    Social History Social History   Tobacco Use   Smoking status: Current Some Day Smoker    Packs/day: 0.25    Types: Cigarettes   Smokeless tobacco: Never Used  Substance Use Topics   Alcohol use: Yes    Alcohol/week: 1.0 standard drinks    Types: 1 Cans of beer per week    Comment: ocassionally   Drug use: No     Allergies   Ciprofloxacin; Clindamycin/lincomycin; Penicillins; and Rocephin [ceftriaxone]   Review of Systems Review of Systems  Gastrointestinal: Positive for abdominal pain.  All other systems reviewed and are negative.    Physical Exam Updated Vital Signs BP 107/72 (BP Location: Left Arm)    Pulse 86    Temp 98.6 F (37 C) (Oral)    Resp 17    Ht 5' 3.5" (1.613 m)    Wt 77.1 kg    SpO2 100%    BMI 29.64 kg/m   Physical Exam Vitals signs and nursing note reviewed.  Constitutional:      General: She is not in acute distress.    Appearance: She is well-developed. She is obese.  HENT:     Head: Atraumatic.  Eyes:     Conjunctiva/sclera: Conjunctivae normal.  Neck:     Musculoskeletal: Neck supple.  Cardiovascular:     Rate and Rhythm: Normal rate and regular rhythm.  Pulmonary:     Effort: Pulmonary effort is normal.     Breath sounds: Normal breath sounds.  Abdominal:     General: Abdomen is flat.     Palpations: Abdomen is soft.     Tenderness: There is abdominal tenderness in the left lower quadrant.  Genitourinary:    Comments: Pelvic exam performed with permission of pt and female ED tech assist during exam.  External genitalia w/out lesions.  Vaginal vault with normal functional discharge.  Cervix w/out lesions, not friable, GC/Chlamydia and wet prep obtained and sent to lab.  Bimanual exam w/out CMT, uterine or adnexal tenderness  Skin:    Findings: No rash.  Neurological:     Mental Status: She is alert.      ED Treatments / Results  Labs (all labs ordered are listed, but only  abnormal results are displayed) Labs Reviewed  WET PREP, GENITAL - Abnormal; Notable for the following components:      Result Value   Clue Cells Wet Prep HPF POC PRESENT (*)    All other components within normal limits  CBC WITH DIFFERENTIAL/PLATELET - Abnormal;  Notable for the following components:   Eosinophils Absolute 0.6 (*)    All other components within normal limits  URINALYSIS, ROUTINE W REFLEX MICROSCOPIC - Abnormal; Notable for the following components:   Hgb urine dipstick MODERATE (*)    Ketones, ur 5 (*)    All other components within normal limits  COMPREHENSIVE METABOLIC PANEL  LIPASE, BLOOD  RPR  HIV ANTIBODY (ROUTINE TESTING W REFLEX)  POC URINE PREG, ED  GC/CHLAMYDIA PROBE AMP ([place]) NOT AT [facility]    EKG None  Radiology US Transvaginal Non-ob  Result Date: 09/17/2018 CLINICAL DATA:  Acute left lower quadrant abdominal pain. EXAM: TRANSABDOMINAL AND TRANSVAGINAL ULTRASOUND OF PELVIS DOPPLER ULTRASOUND OF OVARIES TECHNIQUE: Both transabdominal and transvaginal ultrasound examinations of the pelvis were performed. Transabdominal technique was performed for global imaging of the pelvis including uterus, ovaries, adnexal regions, and pelvic cul-de-sac. It was necessary to proceed with endovaginal exam following the transabdominal exam to visualize the endometrium and ovaries. Color and duplex Doppler ultrasound was utilized to evaluate blood flow to the ovaries. COMPARISON:  None. FINDINGS: Uterus Measurements: 7.8 x 4.7 x 3.7 cm = volume: 69 mL. No fibroids or other mass visualized. Endometrium Thickness: 4 mm which is within normal limits. No focal abnormality visualized. Right ovary Measurements: 3.2 x 2.9 x 2.1 cm = volume: 10 mL. Normal appearance/no adnexal mass. Left ovary Measurements: 2.9 x 3.1 x 2.2 cm = volume: 10 mL. 1.9 cm simple follicular cyst is noted. Pulsed Doppler evaluation of both ovaries demonstrates normal low-resistance arterial and venous  waveforms. Other findings Trace free fluid is noted which most likely is physiologic. IMPRESSION: No significant abnormality seen in the pelvis. Electronically Signed   By: Lupita Raider, M.D.   On: 09/17/2018 17:52   US Pelvis Complete  Result Date: 09/17/2018 CLINICAL DATA:  Acute left lower quadrant abdominal pain. EXAM: TRANSABDOMINAL AND TRANSVAGINAL ULTRASOUND OF PELVIS DOPPLER ULTRASOUND OF OVARIES TECHNIQUE: Both transabdominal and transvaginal ultrasound examinations of the pelvis were performed. Transabdominal technique was performed for global imaging of the pelvis including uterus, ovaries, adnexal regions, and pelvic cul-de-sac. It was necessary to proceed with endovaginal exam following the transabdominal exam to visualize the endometrium and ovaries. Color and duplex Doppler ultrasound was utilized to evaluate blood flow to the ovaries. COMPARISON:  None. FINDINGS: Uterus Measurements: 7.8 x 4.7 x 3.7 cm = volume: 69 mL. No fibroids or other mass visualized. Endometrium Thickness: 4 mm which is within normal limits. No focal abnormality visualized. Right ovary Measurements: 3.2 x 2.9 x 2.1 cm = volume: 10 mL. Normal appearance/no adnexal mass. Left ovary Measurements: 2.9 x 3.1 x 2.2 cm = volume: 10 mL. 1.9 cm simple follicular cyst is noted. Pulsed Doppler evaluation of both ovaries demonstrates normal low-resistance arterial and venous waveforms. Other findings Trace free fluid is noted which most likely is physiologic. IMPRESSION: No significant abnormality seen in the pelvis. Electronically Signed   By: Lupita Raider, M.D.   On: 09/17/2018 17:52   [place] Art/ven Flow Abd Pelv Doppler  Result Date: 09/17/2018 CLINICAL DATA:  Acute left lower quadrant abdominal pain. EXAM: TRANSABDOMINAL AND TRANSVAGINAL ULTRASOUND OF PELVIS DOPPLER ULTRASOUND OF OVARIES TECHNIQUE: Both transabdominal and transvaginal ultrasound examinations of the pelvis were performed. Transabdominal technique was  performed for global imaging of the pelvis including uterus, ovaries, adnexal regions, and pelvic cul-de-sac. It was necessary to proceed with endovaginal exam following the transabdominal exam to visualize the endometrium and ovaries. Color and duplex Doppler ultrasound  was utilized to evaluate blood flow to the ovaries. COMPARISON:  None. FINDINGS: Uterus Measurements: 7.8 x 4.7 x 3.7 cm = volume: 69 mL. No fibroids or other mass visualized. Endometrium Thickness: 4 mm which is within normal limits. No focal abnormality visualized. Right ovary Measurements: 3.2 x 2.9 x 2.1 cm = volume: 10 mL. Normal appearance/no adnexal mass. Left ovary Measurements: 2.9 x 3.1 x 2.2 cm = volume: 10 mL. 1.9 cm simple follicular cyst is noted. Pulsed Doppler evaluation of both ovaries demonstrates normal low-resistance arterial and venous waveforms. Other findings Trace free fluid is noted which most likely is physiologic. IMPRESSION: No significant abnormality seen in the pelvis. Electronically Signed   By: Lupita Raider, M.D.   On: 09/17/2018 17:52    Procedures Procedures (including critical care time)  Medications Ordered in ED Medications  ketorolac (TORADOL) 30 MG/ML injection 30 mg (30 mg Intravenous Given 09/17/18 1427)     Initial Impression / Assessment and Plan / ED Course  I have reviewed the triage vital signs and the nursing notes.  Pertinent labs & imaging results that were available during my care of the patient were reviewed by me and considered in my medical decision making (see chart for details).        BP (!) 109/46 (BP Location: Left Arm)    Pulse 66    Temp 98.6 F (37 C) (Oral)    Resp 16    Ht 5' 3.5" (1.613 m)    Wt 77.1 kg    SpO2 100%    BMI 29.64 kg/m    Final Clinical Impressions(s) / ED Diagnoses   Final diagnoses:  Left lower quadrant abdominal pain  Asymptomatic microscopic hematuria    ED Discharge Orders    None     1:40 PM Patient here with pain to her left  pelvic/left abdomen, worsening with movement.  She does have some reproducible tenderness to the affected area on exam.  She is afebrile, vital signs stable.  Will perform pelvic exam and provide pain medication.  2:11 PM Mild L adnexal tenderness without CMT.   5:57 PM Transvaginal US normal, no evidence of ovarian torsion or TOA.  preg test negative, UA shows moderate Hgb without infection.  She does not have CVA tenderness to suggest kidney stone.  Wet prep with clue cells but no other sxs to suggest BV. Labs are reassuring.  Doubt appy.   6:01 PM Pt has appetite, she felt better after receiving NSAIDs.  She feels comfortable going home.  She will f/u with PCP for further care.  Return precaution discussed.    Fayrene Helper, PA-C 09/17/18 1805    Charlynne Pander, MD 09/21/18 [phone]

## 2018-09-17 NOTE — ED Triage Notes (Signed)
Pt bib EMS and coming from home.  Pt presents with lower left abd pain x 3 days.  Pt started as cramping but has progressed to sharp pain.  Pt denies vaginal bleeding.  EMS reported that the pain is tender to palpation.  Hx of endometrial ablation 8 years ago.

## 2018-09-17 NOTE — ED Notes (Signed)
1 unsuccessful IV attempt by this RN. Charge RN to attempt.

## 2018-09-18 LAB — HIV ANTIBODY (ROUTINE TESTING W REFLEX): HIV Screen 4th Generation wRfx: NONREACTIVE

## 2018-09-18 LAB — RPR: RPR Ser Ql: NONREACTIVE

## 2018-09-20 LAB — GC/CHLAMYDIA PROBE AMP (~~LOC~~) NOT AT ARMC
Chlamydia: NEGATIVE
Neisseria Gonorrhea: NEGATIVE

## 2018-09-21 DIAGNOSIS — M4307 Spondylolysis, lumbosacral region: Secondary | ICD-10-CM | POA: Diagnosis not present

## 2018-09-24 ENCOUNTER — Telehealth: Payer: Medicaid Other | Admitting: Family Medicine

## 2018-09-24 ENCOUNTER — Telehealth: Payer: Self-pay | Admitting: Family Medicine

## 2018-09-24 ENCOUNTER — Other Ambulatory Visit: Payer: Self-pay

## 2018-09-24 NOTE — Telephone Encounter (Signed)
Patient scheduled for telemedicine visit.  I gave her a call today at the schedule appointment time.  She is complaining of left lower quadrant pain.  She was seen in the emergency department but not given a diagnosis yesterday.  She would like to be examined here and have blood work done.  She will therefore be scheduled for an appointment on Monday in person.  Ibuprofen 600 mg is currently controlling her pain.  She describes it as a dull cramp in her left lower quadrant.  She was constipated until yesterday and that is improved today so that could also be the reason for her pain.  No fevers or chills.  No nausea or vomiting.  She is eating well.  No other red flags and therefore she can wait to be evaluated in person on Monday, which she prefers.

## 2018-09-27 ENCOUNTER — Other Ambulatory Visit: Payer: Self-pay

## 2018-09-27 ENCOUNTER — Ambulatory Visit (INDEPENDENT_AMBULATORY_CARE_PROVIDER_SITE_OTHER): Payer: Medicaid Other | Admitting: Family Medicine

## 2018-09-27 VITALS — BP 104/60 | HR 84 | Temp 98.6°F | Ht 63.0 in | Wt 175.1 lb

## 2018-09-27 DIAGNOSIS — N760 Acute vaginitis: Secondary | ICD-10-CM | POA: Diagnosis not present

## 2018-09-27 DIAGNOSIS — B9689 Other specified bacterial agents as the cause of diseases classified elsewhere: Secondary | ICD-10-CM | POA: Diagnosis not present

## 2018-09-27 MED ORDER — METRONIDAZOLE 0.75 % VA GEL: 1.0000 | Freq: Two times a day (BID) | VAGINAL | 0 refills | Status: DC

## 2018-10-07 DIAGNOSIS — N760 Acute vaginitis: Secondary | ICD-10-CM

## 2018-10-07 DIAGNOSIS — B9689 Other specified bacterial agents as the cause of diseases classified elsewhere: Secondary | ICD-10-CM | POA: Insufficient documentation

## 2018-10-07 HISTORY — DX: Acute vaginitis: N76.0

## 2018-10-07 HISTORY — DX: Other specified bacterial agents as the cause of diseases classified elsewhere: B96.89

## 2018-10-07 NOTE — Patient Instructions (Signed)
declined

## 2018-10-07 NOTE — Progress Notes (Signed)
    Subjective:  [name] is a 33 y.o. female who presents to the [facility] today with a chief complaint of discharge.   HPI: C/O discharge that she says is consistent with prior episodes of BV.  Some odor, no bleeding, no pain.  She denies high risk sexual activity or new partners or concern for STI. She states she had a recent telemed visit for abdominal pain but that went away after a bowel movement.  We discussed that normally we would swab for this and patient declines, we also discussed that pregnancy status impacts treatment offerings and she declined a pregnancy test.  No one is hurting her and she feels safe at home.  No urinary symptoms, no complaints of lesions   Objective:  Physical Exam: BP 104/60   Pulse 84   Temp 98.6 F (37 C) (Oral)   Ht 5\' 3"  (1.6 m)   Wt 175 lb 2 oz (79.4 kg)   SpO2 97%   BMI 31.02 kg/m   Gen: NAD, conversing comfortably GI: Normal bowel sounds present. Soft, Nontender, Nondistended. MSK: no edema, cyanosis, or clubbing noted Skin: warm, dry Neuro: grossly normal, moves all extremities Psych: Normal affect and thought content  No results found for this or any previous visit (from the past 72 hour(s)).   Assessment/Plan:  BV (bacterial vaginosis) Presumptive diagnosis off of patients report of symptoms similar to past episode, she declines swab/urine preg test saying she needs to get going.  Offered metro gel due to unknown pregnancy status with return precautions. Advised we could be missing other illnesses without lab work and patient understands   Marthenia Rolling, DO FAMILY MEDICINE RESIDENT - PGY2 10/07/2018 8:55 AM

## 2018-10-07 NOTE — Assessment & Plan Note (Signed)
Presumptive diagnosis off of patients report of symptoms similar to past episode, she declines swab/urine preg test saying she needs to get going.  Offered metro gel due to unknown pregnancy status with return precautions. Advised we could be missing other illnesses without lab work and patient understands

## 2019-03-08 ENCOUNTER — Telehealth: Payer: Self-pay | Admitting: Family Medicine

## 2019-03-08 DIAGNOSIS — L0292 Furuncle, unspecified: Secondary | ICD-10-CM

## 2019-03-08 NOTE — Telephone Encounter (Signed)
Pt would like a referral to see a OBGYN. jw

## 2019-03-17 ENCOUNTER — Other Ambulatory Visit: Payer: Self-pay | Admitting: Family Medicine

## 2019-03-17 DIAGNOSIS — L0292 Furuncle, unspecified: Secondary | ICD-10-CM

## 2019-03-17 NOTE — Telephone Encounter (Signed)
Pt requesting OBGYN referral with [facility] and associated Dr. Sherron Monday. I called patient today and apologized for not having return her call earlier.  Pt not pleasant during conversation. Once again apologized and will make referral to preferred physician.  Carollee Leitz MD

## 2019-04-08 DIAGNOSIS — R102 Pelvic and perineal pain: Secondary | ICD-10-CM | POA: Diagnosis not present

## 2019-04-08 DIAGNOSIS — N800 Endometriosis of uterus: Secondary | ICD-10-CM | POA: Diagnosis not present

## 2019-04-08 DIAGNOSIS — R8290 Unspecified abnormal findings in urine: Secondary | ICD-10-CM | POA: Diagnosis not present

## 2019-05-31 DIAGNOSIS — Z87448 Personal history of other diseases of urinary system: Secondary | ICD-10-CM | POA: Diagnosis not present

## 2020-03-15 ENCOUNTER — Emergency Department (HOSPITAL_COMMUNITY)
Admission: EM | Admit: 2020-03-15 | Discharge: 2020-03-15 | Disposition: A | Discharge status: Home / Self Care | Payer: Medicaid Other | Attending: Emergency Medicine | Admitting: Emergency Medicine

## 2020-03-15 ENCOUNTER — Other Ambulatory Visit: Payer: Self-pay

## 2020-03-15 ENCOUNTER — Encounter (HOSPITAL_COMMUNITY): Payer: Self-pay | Admitting: Emergency Medicine

## 2020-03-15 DIAGNOSIS — R000 Tachycardia, unspecified: Secondary | ICD-10-CM | POA: Insufficient documentation

## 2020-03-15 DIAGNOSIS — R1010 Upper abdominal pain, unspecified: Secondary | ICD-10-CM | POA: Diagnosis not present

## 2020-03-15 DIAGNOSIS — Z20822 Contact with and (suspected) exposure to covid-19: Secondary | ICD-10-CM | POA: Diagnosis not present

## 2020-03-15 DIAGNOSIS — R197 Diarrhea, unspecified: Secondary | ICD-10-CM | POA: Diagnosis not present

## 2020-03-15 DIAGNOSIS — R1084 Generalized abdominal pain: Secondary | ICD-10-CM | POA: Diagnosis not present

## 2020-03-15 DIAGNOSIS — F17210 Nicotine dependence, cigarettes, uncomplicated: Secondary | ICD-10-CM | POA: Diagnosis not present

## 2020-03-15 DIAGNOSIS — R112 Nausea with vomiting, unspecified: Secondary | ICD-10-CM | POA: Diagnosis not present

## 2020-03-15 DIAGNOSIS — J45909 Unspecified asthma, uncomplicated: Secondary | ICD-10-CM | POA: Diagnosis not present

## 2020-03-15 DIAGNOSIS — R52 Pain, unspecified: Secondary | ICD-10-CM | POA: Diagnosis not present

## 2020-03-15 DIAGNOSIS — R102 Pelvic and perineal pain: Secondary | ICD-10-CM

## 2020-03-15 DIAGNOSIS — K920 Hematemesis: Secondary | ICD-10-CM | POA: Diagnosis not present

## 2020-03-15 DIAGNOSIS — R58 Hemorrhage, not elsewhere classified: Secondary | ICD-10-CM | POA: Diagnosis not present

## 2020-03-15 LAB — CBC
HCT: 40.7 % (ref 36.0–46.0)
Hemoglobin: 13.5 g/dL (ref 12.0–15.0)
MCH: 30.8 pg (ref 26.0–34.0)
MCHC: 33.2 g/dL (ref 30.0–36.0)
MCV: 92.9 fL (ref 80.0–100.0)
Platelets: 350 10*3/uL (ref 150–400)
RBC: 4.38 MIL/uL (ref 3.87–5.11)
RDW: 12 % (ref 11.5–15.5)
WBC: 9.2 10*3/uL (ref 4.0–10.5)
nRBC: 0 % (ref 0.0–0.2)

## 2020-03-15 LAB — RESPIRATORY PANEL BY RT PCR (FLU A&B, COVID)
Influenza A by PCR: NEGATIVE
Influenza B by PCR: NEGATIVE
SARS Coronavirus 2 by RT PCR: NEGATIVE

## 2020-03-15 LAB — COMPREHENSIVE METABOLIC PANEL
ALT: 24 U/L (ref 0–44)
AST: 19 U/L (ref 15–41)
Albumin: 3.5 g/dL (ref 3.5–5.0)
Alkaline Phosphatase: 74 U/L (ref 38–126)
Anion gap: 12 (ref 5–15)
BUN: 9 mg/dL (ref 6–20)
CO2: 23 mmol/L (ref 22–32)
Calcium: 9 mg/dL (ref 8.9–10.3)
Chloride: 101 mmol/L (ref 98–111)
Creatinine, Ser: 0.81 mg/dL (ref 0.44–1.00)
GFR calc non Af Amer: >60 mL/min (ref >60)
Glucose, Bld: 106 mg/dL — ABNORMAL HIGH (ref 70–99)
Potassium: 3.6 mmol/L (ref 3.5–5.1)
Sodium: 136 mmol/L (ref 135–145)
Total Bilirubin: 0.5 mg/dL (ref 0.3–1.2)
Total Protein: 7.1 g/dL (ref 6.5–8.1)

## 2020-03-15 LAB — URINALYSIS, ROUTINE W REFLEX MICROSCOPIC
Bilirubin Urine: NEGATIVE
Glucose, UA: NEGATIVE mg/dL
Ketones, ur: 5 mg/dL — AB
Leukocytes,Ua: NEGATIVE
Nitrite: NEGATIVE
Protein, ur: NEGATIVE mg/dL
Specific Gravity, Urine: 1.026 (ref 1.005–1.030)
pH: 5 (ref 5.0–8.0)

## 2020-03-15 LAB — I-STAT BETA HCG BLOOD, ED (MC, WL, AP ONLY): I-stat hCG, quantitative: <5 m[IU]/mL (ref <5)

## 2020-03-15 LAB — WET PREP, GENITAL
Clue Cells Wet Prep HPF POC: NONE SEEN
Sperm: NONE SEEN
Trich, Wet Prep: NONE SEEN
Yeast Wet Prep HPF POC: NONE SEEN

## 2020-03-15 LAB — LIPASE, BLOOD: Lipase: 21 U/L (ref 11–51)

## 2020-03-15 MED ORDER — ONDANSETRON HCL 4 MG/2ML IJ SOLN
4.0000 mg | Freq: Once | INTRAMUSCULAR | Status: DC
  Filled 2020-03-15: qty 2

## 2020-03-15 MED ORDER — IBUPROFEN 400 MG PO TABS
600.0000 mg | ORAL_TABLET | Freq: Once | ORAL | Status: AC
  Administered 2020-03-15: 600 mg via ORAL
  Filled 2020-03-15: qty 1

## 2020-03-15 MED ORDER — IBUPROFEN 600 MG PO TABS: 600.0000 mg | ORAL_TABLET | Freq: Four times a day (QID) | ORAL | 0 refills | Status: DC | PRN

## 2020-03-15 MED ORDER — ONDANSETRON 4 MG PO TBDP: 4.0000 mg | ORAL_TABLET | Freq: Three times a day (TID) | ORAL | 0 refills | Status: DC | PRN

## 2020-03-15 MED ORDER — SODIUM CHLORIDE 0.9 % IV BOLUS: 1000.0000 mL | Freq: Once | INTRAVENOUS | Status: DC

## 2020-03-15 MED ORDER — ONDANSETRON 4 MG PO TBDP
4.0000 mg | ORAL_TABLET | Freq: Once | ORAL | Status: AC
  Administered 2020-03-15: 4 mg via ORAL
  Filled 2020-03-15: qty 1

## 2020-03-15 NOTE — ED Provider Notes (Signed)
[facility] EMERGENCY DEPARTMENT Provider Note   CSN: [id number] Arrival date & time: 03/15/20  1453     History Chief Complaint  Patient presents with  . Emesis  . Abdominal Pain  . Flank Pain    Natalina Wieting is a 34 y.o. female with PMH of adenomyosis and pyelonephritis who presents to the ED with complaints of suprapubic/pelvic pain.  Patient reports that at approximately 3 AM she woke up with sudden onset nausea, vomiting, and loose stools.  Patient reports that she had approximately 2-3 episodes of loose stools and bright red blood per rectum.  She also states that she had multiple episodes of emesis, questionably hematemesis.  She thought it appeared dark.  She showed me a picture and was unremarkable.  She denies any severe GERD symptoms or history concerning for PUD.  She states that around 5 AM her symptoms completely stopped spontaneously and since then she has only been endorsing some continued suprapubic discomfort.  Her pain occasionally radiates towards her low back, but she states that it has improved since her onset of symptoms at 3 AM.    Patient also notes that she has been experiencing symptoms of congestion and chills over the past few days.  She states that she received an at home COVID-19 test at onset of her symptoms, however it was negative.  She has not been immunized for COVID-19.  She informs that she is in a committed relationship with her female partner for nearly 7 years and she is not particular concerned for STI.  However, she did note some abnormal vaginal discharge last night and has been having some mild dyspareunia.  Patient denies any documented fevers, headache or dizziness, chest pain, shortness of breath, cough, sore throat, increased urinary frequency or dysuria, or other symptoms.  HPI     Past Medical History:  Diagnosis Date  . Adenomyosis 2012   Planning hysterectomy   . ADHD (attention deficit hyperactivity disorder)    NO  MEDS  . 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 Asthma 1988  . Thyromegaly    H/O    Patient Active Problem List   Diagnosis Date Noted  . BV (bacterial vaginosis) 10/07/2018  . URI, acute 06/30/2016  . Sepsis secondary to UTI (HCC) 06/05/2015  . Pyelonephritis 06/02/2015  . Sepsis (HCC) 06/02/2015  . Thrombocytopenia (HCC) 06/02/2015  . AKI (acute kidney injury) (HCC) 06/02/2015  . Hypokalemia 06/02/2015  . Vomiting and diarrhea 09/03/2012  . ADHD (attention deficit hyperactivity disorder) 09/03/2012  . Chronic abdominal pain 05/27/2012  . Decreased vision 03/19/2012  . Tobacco abuse 03/19/2012  . Refusal of blood transfusions as patient is Jehovah's Witness 11/11/2011  . Adenomyosis   . Asthma     Past Surgical History:  Procedure Laterality Date  . ENDOMETRIAL ABLATION  2011  . INCISE AND DRAIN ABCESS     surgical incision, wound produced mrsa  . TUBAL LIGATION  2010     OB History    Gravida  3   Para  3   Term  3   Preterm      AB      Living  3     SAB      TAB      Ectopic      Multiple      Live Births  3           Family History  Problem Relation Age of Onset  .  Alzheimer's disease Maternal Grandfather   . Depression Sister   . Depression Mother   . Diabetes Father   . Ovarian cancer Other        maternal great grandmother  . Uterine cancer Other        maternal great aunt  . Testicular cancer Cousin        maternal cousin    Social History   Tobacco Use  . Smoking status: Current Some Day Smoker    Packs/day: 0.25    Types: Cigarettes  . Smokeless tobacco: Never Used  Vaping Use  . Vaping Use: Never used  Substance Use Topics  . Alcohol use: Yes    Alcohol/week: 1.0 standard drink    Types: 1 Cans of beer per week    Comment: ocassionally  . Drug use: No    Home Medications Prior to Admission medications   Medication Sig Start Date End Date Taking? Authorizing Provider  ibuprofen (ADVIL) 600 MG tablet Take 1  tablet (600 mg total) by mouth every 6 (six) hours as needed for cramping. 03/15/20   Lorelee NewGreen, Jaelene Garciagarcia L, PA-C  methylPREDNISolone (MEDROL DOSEPAK) 4 MG TBPK tablet Taper over 6 days. 06/25/18   Khatri, Hina, PA-C  metroNIDAZOLE (METROGEL VAGINAL) 0.75 % vaginal gel Place 1 Applicatorful vaginally 2 (two) times daily. 09/27/18   Bland, Scott, DO  ondansetron (ZOFRAN ODT) 4 MG disintegrating tablet Take 1 tablet (4 mg total) by mouth every 8 (eight) hours as needed for nausea or vomiting. 03/15/20   Lorelee NewGreen, Lucca Greggs L, PA-C    Allergies    Ciprofloxacin, Clindamycin/lincomycin, Penicillins, and Rocephin [ceftriaxone]  Review of Systems   Review of Systems  All other systems reviewed and are negative.   Physical Exam Updated Vital Signs BP (!) 109/46   Pulse (!) 101   Temp 99 F (37.2 C) (Oral)   Resp 16   Ht 5\' 4"  (1.626 m)   Wt 77.1 kg   SpO2 97%   BMI 29.18 kg/m   Physical Exam Vitals and nursing note reviewed. Exam conducted with a chaperone present.  Constitutional:      Comments: Mildly uncomfortable.  HENT:     Head: Normocephalic and atraumatic.  Eyes:     General: No scleral icterus.    Conjunctiva/sclera: Conjunctivae normal.  Cardiovascular:     Rate and Rhythm: Regular rhythm. Tachycardia present.     Pulses: Normal pulses.     Heart sounds: Normal heart sounds.  Pulmonary:     Effort: Pulmonary effort is normal. No respiratory distress.     Breath sounds: Normal breath sounds. No wheezing or rales.  Abdominal:     Comments: Soft, nondistended.  Mild TTP in suprapubic region.  No guarding.  No peritoneal signs.  No tenderness elsewhere.  No overlying skin changes.  Normoactive bowel sounds.  Genitourinary:    Comments: Speculum exam: Moderate amount of thin white discharge in vaginal vault.  Cervix is visualized and does not appear friable or erythematous.  No vaginal bleeding.  No abnormal discharge. Bimanual exam: Very mild cervical motion tenderness.  No chandelier  sign or guarding.  No adnexal masses or tenderness appreciated.  Reassuring exam. Musculoskeletal:     Cervical back: Normal range of motion. No rigidity.     Right lower leg: No edema.     Left lower leg: No edema.  Skin:    General: Skin is dry.     Capillary Refill: Capillary refill takes less than 2 seconds.  Neurological:  Mental Status: She is alert and oriented to person, place, and time.     GCS: GCS eye subscore is 4. GCS verbal subscore is 5. GCS motor subscore is 6.  Psychiatric:        Mood and Affect: Mood normal.        Behavior: Behavior normal.        Thought Content: Thought content normal.     ED Results / Procedures / Treatments   Labs (all labs ordered are listed, but only abnormal results are displayed) Labs Reviewed  WET PREP, GENITAL - Abnormal; Notable for the following components:      Result Value   WBC, Wet Prep HPF POC FEW (*)    All other components within normal limits  COMPREHENSIVE METABOLIC PANEL - Abnormal; Notable for the following components:   Glucose, Bld 106 (*)    All other components within normal limits  URINALYSIS, ROUTINE W REFLEX MICROSCOPIC - Abnormal; Notable for the following components:   APPearance HAZY (*)    Hgb urine dipstick MODERATE (*)    Ketones, ur 5 (*)    Bacteria, UA RARE (*)    All other components within normal limits  RESPIRATORY PANEL BY RT PCR (FLU A&B, COVID)  LIPASE, BLOOD  CBC  I-STAT BETA HCG BLOOD, ED ([facility], [facility], [facility] ONLY)  GC/CHLAMYDIA PROBE AMP ([place]) NOT AT [facility]    EKG None  Radiology No results found.  Procedures Procedures (including critical care time)  Medications Ordered in ED Medications  ondansetron (ZOFRAN-ODT) disintegrating tablet 4 mg (4 mg Oral Given 03/15/20 1755)  ibuprofen (ADVIL) tablet 600 mg (600 mg Oral Given 03/15/20 1755)    ED Course  I have reviewed the triage vital signs and the nursing notes.  Pertinent labs & imaging results that were available during my  care of the patient were reviewed by me and considered in my medical decision making (see chart for details).    MDM Rules/Calculators/A&P                          Patient presents for persistent suprapubic region discomfort.  She is followed by an OB/GYN for her adenomyosis, Dr. Jackelyn Knife, but admits that she has missed her past few appointments.  She plans to follow-up with him regarding her onset of suprapubic discomfort that radiates towards her low back.  We will obtain basic laboratory work-up including wet prep and GC testing.  Her abdominal exam is reassuring without any evidence of peritoneal signs.  Do not feel as though CT abdomen pelvis is warranted.  Vital signs are stable, although mild tachycardia.  Given vomiting and diarrhea, will resuscitate with 1 L IV NS given possible dehydration contributing to her mildly elevated heart rate.  Labs CBC: No anemia or leukocytosis concerning for infection. CMP: Unremarkable.  No electrolyte derangement. Beta-hCG: Less than 5, not pregnant. Lipase: WNL. Wet prep: Few WBC, otherwise unremarkable. GC testing: In process.   Respiratory panel: In process.  I reviewed patient's medical record and she had ultrasound of her pelvis and abdomen in April 2020 which demonstrated no significant abnormalities.  On subsequent evaluation, patient states that she has been having similar lower abdominal discomfort for years and has had numerous imagings performed that have all been unremarkable.  She states that she had an endometrial ablation in 2011 and since then has been having vaginal spotting, but no periods.  She understands that this is likely pelvic in  etiology and plans to follow-up with her OB/GYN, Dr. Jackelyn Knife.  Her pelvic exam was relatively unremarkable.  She did have a moderate amount of thin white discharge in vaginal vault, but no dark discharge.  Her cervix was visualized and does not appear friable.  She had very mild cervical tenderness, no  significant TTP or chandelier sign.  No adnexal tenderness or masses noted.  Given lack of concern for STI, will not treat empirically for PID.  Based on history physical exam, I have lower suspicion for ovarian torsion or ruptured ovarian cyst. I do not feel as though repeat transvaginal ultrasound is warranted. She admits that she has had over 5 transvaginal ultrasounds in the past few years for similar complaints that have been unremarkable. Her symptoms of suprapubic discomfort that radiates towards her low back in the context of mild vaginal bleeding is suggestive of dysmenorrhea. On final examination, patient feels much improved after oral ibuprofen and Zofran ODT. She feels stable for discharge and plans to follow-up with her OB/GYN, Dr. Jackelyn Knife. She is reassured by today's work-up.  Shian Goodnow was evaluated in Emergency Department on 03/15/2020 for the symptoms described in the history of present illness. She was evaluated in the context of the global COVID-19 pandemic, which necessitated consideration that the patient might be at risk for infection with the SARS-CoV-2 virus that causes COVID-19. Institutional protocols and algorithms that pertain to the evaluation of patients at risk for COVID-19 are in a state of rapid change based on information released by regulatory bodies including the CDC and federal and state organizations. These policies and algorithms were followed during the patient's care in the ED.   Final Clinical Impression(s) / ED Diagnoses Final diagnoses:  Suprapubic discomfort  Non-intractable vomiting with nausea, unspecified vomiting type    Rx / DC Orders ED Discharge Orders         Ordered    ibuprofen (ADVIL) 600 MG tablet  Every 6 hours PRN        03/15/20 1831    ondansetron (ZOFRAN ODT) 4 MG disintegrating tablet  Every 8 hours PRN        03/15/20 1831           Lorelee New, PA-C 03/15/20 1831    Milagros Loll, MD 03/17/20 1635

## 2020-03-15 NOTE — ED Triage Notes (Signed)
Pt arrived to ED with complaints of emesis x5 starting last night. Pt states her fist episode of emesis was bright red and the last couple were black. Pt also states that when she wiped after urination she found red marks on the toilet paper. Pt states she has suprapubic abd pain that radiates to both flanks.

## 2020-03-15 NOTE — Discharge Instructions (Signed)
Please take the ibuprofen as needed for symptoms of discomfort. Discontinue should you become pregnant or breast-feeding. I have also prescribed Zofran ODT, please take for nausea as needed.  Your symptoms are pelvic in etiology and I recommend that you follow-up with your OB/GYN, Dr. Jackelyn Knife, for ongoing evaluation and management.  Please maintain isolation precautions pending results of your respiratory panel testing.  Return to the ED or seek immediate medical attention should you experience any new or worsening symptoms.

## 2020-03-16 ENCOUNTER — Telehealth: Payer: Self-pay | Admitting: *Deleted

## 2020-03-16 LAB — GC/CHLAMYDIA PROBE AMP (~~LOC~~) NOT AT ARMC
Chlamydia: NEGATIVE
Comment: NEGATIVE
Comment: NORMAL
Neisseria Gonorrhea: NEGATIVE

## 2020-03-16 NOTE — Telephone Encounter (Signed)
Email sent to Family Medicine  to schedule follow up appointment .   Hendy Brindle    PEC [phone] 

## 2020-03-16 NOTE — Telephone Encounter (Signed)
Transition Care Management Follow-up Telephone Call  Date of discharge and from where: 03/15/20 [facility]  How have you been since you were released from the hospital? "Still feeling tired and achy"  Any questions or concerns? No  Items Reviewed:  Did the pt receive and understand the discharge instructions provided? Yes   Medications obtained and verified? Yes   Any new allergies since your discharge? No   Dietary orders reviewed? Yes  Do you have support at home? Yes   Functional Questionnaire: (I = Independent and D = Dependent) ADLs: I  Bathing/Dressing- I  Meal Prep- I  Eating- I  Maintaining continence- I  Transferring/Ambulation- I  Managing Meds- I  Follow up appointments reviewed:   PCP Hospital f/u appt confirmed? No  - PEC Team to address  Specialist Hospital f/u appt confirmed? No    Are transportation arrangements needed? No   If their condition worsens, is the pt aware to call PCP or go to the Emergency Dept.? Yes  Was the patient provided with contact information for the PCP's office or ED? Yes  Was to pt encouraged to call back with questions or concerns? Yes

## 2020-07-02 DIAGNOSIS — U071 COVID-19: Secondary | ICD-10-CM | POA: Diagnosis not present

## 2020-07-09 DIAGNOSIS — R0981 Nasal congestion: Secondary | ICD-10-CM | POA: Diagnosis not present

## 2021-03-17 ENCOUNTER — Ambulatory Visit (HOSPITAL_COMMUNITY)
Admission: EM | Admit: 2021-03-17 | Discharge: 2021-03-17 | Disposition: A | Discharge status: Home / Self Care | Payer: Medicaid Other | Attending: Physician Assistant | Admitting: Physician Assistant

## 2021-03-17 ENCOUNTER — Emergency Department (HOSPITAL_COMMUNITY)
Admission: EM | Admit: 2021-03-17 | Discharge: 2021-03-17 | Disposition: A | Discharge status: Home / Self Care | Payer: Medicaid Other | Attending: Emergency Medicine | Admitting: Emergency Medicine

## 2021-03-17 ENCOUNTER — Other Ambulatory Visit: Payer: Self-pay

## 2021-03-17 ENCOUNTER — Encounter (HOSPITAL_COMMUNITY): Payer: Self-pay

## 2021-03-17 DIAGNOSIS — R109 Unspecified abdominal pain: Secondary | ICD-10-CM | POA: Insufficient documentation

## 2021-03-17 DIAGNOSIS — M549 Dorsalgia, unspecified: Secondary | ICD-10-CM | POA: Diagnosis not present

## 2021-03-17 DIAGNOSIS — R072 Precordial pain: Secondary | ICD-10-CM | POA: Insufficient documentation

## 2021-03-17 DIAGNOSIS — R0789 Other chest pain: Secondary | ICD-10-CM | POA: Diagnosis not present

## 2021-03-17 DIAGNOSIS — K219 Gastro-esophageal reflux disease without esophagitis: Secondary | ICD-10-CM | POA: Diagnosis not present

## 2021-03-17 DIAGNOSIS — Z5321 Procedure and treatment not carried out due to patient leaving prior to being seen by health care provider: Secondary | ICD-10-CM | POA: Diagnosis not present

## 2021-03-17 DIAGNOSIS — R079 Chest pain, unspecified: Secondary | ICD-10-CM

## 2021-03-17 LAB — CBC WITH DIFFERENTIAL/PLATELET
Abs Immature Granulocytes: 0.01 10*3/uL (ref 0.00–0.07)
Basophils Absolute: 0.1 10*3/uL (ref 0.0–0.1)
Basophils Relative: 1 %
Eosinophils Absolute: 0.8 10*3/uL — ABNORMAL HIGH (ref 0.0–0.5)
Eosinophils Relative: 9 %
HCT: 39.1 % (ref 36.0–46.0)
Hemoglobin: 13.1 g/dL (ref 12.0–15.0)
Immature Granulocytes: 0 %
Lymphocytes Relative: 42 %
Lymphs Abs: 3.9 10*3/uL (ref 0.7–4.0)
MCH: 31.5 pg (ref 26.0–34.0)
MCHC: 33.5 g/dL (ref 30.0–36.0)
MCV: 94 fL (ref 80.0–100.0)
Monocytes Absolute: 0.8 10*3/uL (ref 0.1–1.0)
Monocytes Relative: 9 %
Neutro Abs: 3.6 10*3/uL (ref 1.7–7.7)
Neutrophils Relative %: 39 %
Platelets: 414 10*3/uL — ABNORMAL HIGH (ref 150–400)
RBC: 4.16 MIL/uL (ref 3.87–5.11)
RDW: 12.4 % (ref 11.5–15.5)
WBC: 9.1 10*3/uL (ref 4.0–10.5)
nRBC: 0 % (ref 0.0–0.2)

## 2021-03-17 LAB — LIPASE, BLOOD: Lipase: 25 U/L (ref 11–51)

## 2021-03-17 LAB — COMPREHENSIVE METABOLIC PANEL
ALT: 28 U/L (ref 0–44)
AST: 19 U/L (ref 15–41)
Albumin: 3.6 g/dL (ref 3.5–5.0)
Alkaline Phosphatase: 86 U/L (ref 38–126)
Anion gap: 8 (ref 5–15)
BUN: 10 mg/dL (ref 6–20)
CO2: 27 mmol/L (ref 22–32)
Calcium: 9.1 mg/dL (ref 8.9–10.3)
Chloride: 101 mmol/L (ref 98–111)
Creatinine, Ser: 0.73 mg/dL (ref 0.44–1.00)
GFR, Estimated: >60 mL/min (ref >60)
Glucose, Bld: 107 mg/dL — ABNORMAL HIGH (ref 70–99)
Potassium: 4 mmol/L (ref 3.5–5.1)
Sodium: 136 mmol/L (ref 135–145)
Total Bilirubin: 0.4 mg/dL (ref 0.3–1.2)
Total Protein: 6.7 g/dL (ref 6.5–8.1)

## 2021-03-17 LAB — TROPONIN I (HIGH SENSITIVITY): Troponin I (High Sensitivity): <2 ng/L (ref <18)

## 2021-03-17 LAB — I-STAT BETA HCG BLOOD, ED (MC, WL, AP ONLY): I-stat hCG, quantitative: <5 m[IU]/mL (ref <5)

## 2021-03-17 MED ORDER — SUCRALFATE 1 G PO TABS: 1.0000 g | ORAL_TABLET | Freq: Three times a day (TID) | ORAL | 0 refills | Status: DC

## 2021-03-17 MED ORDER — ALUM & MAG HYDROXIDE-SIMETH 200-200-20 MG/5ML PO SUSP
30.0000 mL | Freq: Once | ORAL | Status: AC
  Administered 2021-03-17: 30 mL via ORAL

## 2021-03-17 MED ORDER — LIDOCAINE VISCOUS HCL 2 % MT SOLN
OROMUCOSAL | Status: AC
  Filled 2021-03-17: qty 15

## 2021-03-17 MED ORDER — ALUM & MAG HYDROXIDE-SIMETH 200-200-20 MG/5ML PO SUSP
ORAL | Status: AC
  Filled 2021-03-17: qty 30

## 2021-03-17 MED ORDER — LIDOCAINE VISCOUS HCL 2 % MT SOLN
15.0000 mL | Freq: Once | OROMUCOSAL | Status: AC
  Administered 2021-03-17: 15 mL via ORAL

## 2021-03-17 NOTE — Discharge Instructions (Signed)
I am concerned that you have a stomach problem causing your symptoms since you had improvement with the medication we gave you.  Please start Carafate 3 times a day.  Avoid spicy/acidic/fatty foods.  Follow-up with a gastroenterologist for further evaluation.  If you have any recurrent chest pain, shortness of breath, blood in your stool, nausea/vomiting interfering with oral intake, significant abdominal pain you need to go to the emergency room for further evaluation.

## 2021-03-17 NOTE — ED Triage Notes (Signed)
Pt presents with chest pain X 6 days. Pt states she thought she had gas. States an EKG was done and states she had blood drawn. Pt states the chest pain starts after eating and states the pain is worse at night. States she has lower back pain.

## 2021-03-17 NOTE — ED Notes (Signed)
Pt has not shown up for vital check

## 2021-03-17 NOTE — ED Triage Notes (Signed)
Pt c/o intermittent chest pain since Tuesday. States she thought it was gas/acid reflux. Took GasX without relief. Noticed pain worsen while eating, after eating, and at night time. Pt is concerns that pain is now radiating to the back. No cardiac history

## 2021-03-17 NOTE — ED Provider Notes (Signed)
Emergency Medicine Provider Triage Evaluation Note  [name] , [name] 35 y.o. female  was evaluated in triage.  Pt complains of chest pain.  The patient has been having persistent central substernal chest pain that radiates through to her back for the last 5 days.  Pain is worse at nighttime and with eating.  She is also noted some pain with walking around.  She took 3 doses of naproxen on an empty stomach prior to onset of symptoms.  She also adds that she went to [name] birthday party about [name] week before her pain began where she drank 5 shots of tequila, which is significantly more than her usual social alcohol intake.  She denies shortness of breath, cough, fever, chills, dizziness, lightheadedness, numbness, weakness, paresthesias, melena, hematochezia.  She has been taking Gas-X without relief.  Review of Systems  Positive: Chest pain, abdominal pain, back pain Negative: Nausea, vomiting, fever, chills, constipation, diarrhea, dizziness, lightheadedness, paresthesias, melena, hematochezia, numbness, weakness  Physical Exam  BP 122/78 (BP Location: Left Arm)   Pulse 91   Temp 97.7 F (36.5 C) (Oral)   Resp 18   Ht 5\' 4"  (1.626 m)   Wt 77.1 kg   SpO2 98%   BMI 29.18 kg/m  Gen:   Awake, no distress   Resp:  Normal effort  MSK:   Moves extremities without difficulty  Other:  Abdomen soft and nondistended.  Negative Murphy sign.  Minimal epigastric tenderness.  Abdomen is not distended.  Medical Decision Making  Medically screening exam initiated at 3:16 AM.  Appropriate orders placed.  [name] was informed that the remainder of the evaluation will be completed by another provider, this initial triage assessment does not replace that evaluation, and the importance of remaining in the ED until their evaluation is complete.  Labs have been ordered.  She will require further work-up and evaluation in the emergency department.   Kristina Frame A, PA-C 03/17/21 0316    05/17/21, MD 03/17/21 [phone]

## 2021-03-17 NOTE — ED Provider Notes (Signed)
[facility]    CSN: [id number] Arrival date & time: 03/17/21  1557      History   Chief Complaint Chief Complaint  Patient presents with   Back Pain   Chest Pain    HPI [name] is a 35 y.o. female.   Patient presents today with several day history of lower chest pain.  She went to the hospital earlier today and had basic blood work including a negative troponin as well as normal EKG but did not stay to be seen due to long wait.  She initially thought symptoms were related to gas and so took Gas-X but does not provide any relief of symptoms.  She denies any associated shortness of breath, headache, nausea, vomiting, melena, hematochezia.  She reports pain is rated 9 on a 0-10 pain scale, localized to lower chest underneath her breasts, described as pressure, worse with eating or laying down, no alleviating factors identified.  She denies history of gastric ulcer.  Does report taking NSAIDs regularly wonders if this could be contributing to symptoms.  She has not seen a GI specialist in the past.   Past Medical History:  Diagnosis Date   Adenomyosis 2012   Planning hysterectomy    ADHD (attention deficit hyperactivity disorder)    NO MEDS   Allergy 1988   Hay fever since birth    Asthma 17   Thyromegaly    H/O    Patient Active Problem List   Diagnosis Date Noted   BV (bacterial vaginosis) 10/07/2018   URI, acute 06/30/2016   Sepsis secondary to UTI (HCC) 06/05/2015   Pyelonephritis 06/02/2015   Sepsis (HCC) 06/02/2015   Thrombocytopenia (HCC) 06/02/2015   AKI (acute kidney injury) (HCC) 06/02/2015   Hypokalemia 06/02/2015   Vomiting and diarrhea 09/03/2012   ADHD (attention deficit hyperactivity disorder) 09/03/2012   Chronic abdominal pain 05/27/2012   Decreased vision 03/19/2012   Tobacco abuse 03/19/2012   Refusal of blood transfusions as patient is Jehovah's Witness 11/11/2011   Adenomyosis    Asthma     Past Surgical History:  Procedure  Laterality Date   ENDOMETRIAL ABLATION  2011   INCISE AND DRAIN ABCESS     surgical incision, wound produced mrsa   TUBAL LIGATION  2010    OB History     Gravida  3   Para  3   Term  3   Preterm      AB      Living  3      SAB      IAB      Ectopic      Multiple      Live Births  3            Home Medications    Prior to Admission medications   Medication Sig Start Date End Date Taking? Authorizing Provider  sucralfate (CARAFATE) 1 g tablet Take 1 tablet (1 g total) by mouth in the morning, at noon, and at bedtime. 03/17/21  Yes Myana Schlup K, PA-C  ibuprofen (ADVIL) 600 MG tablet Take 1 tablet (600 mg total) by mouth every 6 (six) hours as needed for cramping. 03/15/20   Lorelee New, PA-C  methylPREDNISolone (MEDROL DOSEPAK) 4 MG TBPK tablet Taper over 6 days. 06/25/18   Khatri, Hina, PA-C  metroNIDAZOLE (METROGEL VAGINAL) 0.75 % vaginal gel Place 1 Applicatorful vaginally 2 (two) times daily. 09/27/18   Marthenia Rolling, DO  ondansetron (ZOFRAN ODT) 4 MG disintegrating tablet Take  1 tablet (4 mg total) by mouth every 8 (eight) hours as needed for nausea or vomiting. 03/15/20   Lorelee New, PA-C    Family History Family History  Problem Relation Age of Onset   Alzheimer's disease Maternal Grandfather    Depression Sister    Depression Mother    Diabetes Father    Ovarian cancer Other        maternal great grandmother   Uterine cancer Other        maternal great aunt   Testicular cancer Cousin        maternal cousin    Social History Social History   Tobacco Use   Smoking status: Some Days    Packs/day: 0.25    Types: Cigarettes   Smokeless tobacco: Never  Vaping Use   Vaping Use: Never used  Substance Use Topics   Alcohol use: Yes    Alcohol/week: 1.0 standard drink    Types: 1 Cans of beer per week    Comment: ocassionally   Drug use: No     Allergies   Ciprofloxacin, Clindamycin/lincomycin, Penicillins, and Rocephin  [ceftriaxone]   Review of Systems Review of Systems  Constitutional:  Positive for activity change. Negative for appetite change, fatigue and fever.  Respiratory:  Negative for cough and shortness of breath.   Cardiovascular:  Positive for chest pain. Negative for palpitations and leg swelling.  Gastrointestinal:  Negative for abdominal pain, blood in stool, diarrhea, nausea and vomiting.  Neurological:  Negative for dizziness, light-headedness and headaches.    Physical Exam Triage Vital Signs ED Triage Vitals  Enc Vitals Group     BP 03/17/21 1718 115/71     Pulse Rate 03/17/21 1718 (!) 103     Resp 03/17/21 1718 17     Temp 03/17/21 1718 98.3 F (36.8 C)     Temp Source 03/17/21 1718 Oral     SpO2 03/17/21 1718 100 %     Weight --      Height --      Head Circumference --      Peak Flow --      Pain Score 03/17/21 1719 9     Pain Loc --      Pain Edu? --      Excl. in GC? --    No data found.  Updated Vital Signs BP 115/71 (BP Location: Right Arm)   Pulse (!) 103   Temp 98.3 F (36.8 C) (Oral)   Resp 17   SpO2 100%   Visual Acuity Right Eye Distance:   Left Eye Distance:   Bilateral Distance:    Right Eye Near:   Left Eye Near:    Bilateral Near:     Physical Exam Vitals reviewed.  Constitutional:      General: She is awake. She is not in acute distress.    Appearance: Normal appearance. She is well-developed. She is not ill-appearing.     Comments: Very pleasant female appears stated age no acute distress  HENT:     Head: Normocephalic and atraumatic.  Cardiovascular:     Rate and Rhythm: Normal rate and regular rhythm.     Heart sounds: Normal heart sounds, S1 normal and S2 normal. No murmur heard. Pulmonary:     Effort: Pulmonary effort is normal.     Breath sounds: Normal breath sounds. No wheezing, rhonchi or rales.     Comments: Clear to auscultation bilaterally Chest:     Chest wall: No deformity,  swelling or tenderness.     Comments: Pain  not reproducible on exam. Abdominal:     General: Bowel sounds are normal.     Palpations: Abdomen is soft.     Tenderness: There is no abdominal tenderness. There is no right CVA tenderness, left CVA tenderness, guarding or rebound.     Comments: Benign abdominal exam  Psychiatric:        Behavior: Behavior is cooperative.     UC Treatments / Results  Labs (all labs ordered are listed, but only abnormal results are displayed) Labs Reviewed - No data to display  EKG   Radiology No results found.  Procedures Procedures (including critical care time)  Medications Ordered in UC Medications  alum & mag hydroxide-simeth (MAALOX/MYLANTA) 200-200-20 MG/5ML suspension 30 mL (30 mLs Oral Given 03/17/21 1821)    And  lidocaine (XYLOCAINE) 2 % viscous mouth solution 15 mL (15 mLs Oral Given 03/17/21 1821)    Initial Impression / Assessment and Plan / UC Course  I have reviewed the triage vital signs and the nursing notes.  Pertinent labs & imaging results that were available during my care of the patient were reviewed by me and considered in my medical decision making (see chart for details).      Patient had negative work-up in the emergency room including normal EKG and negative troponin.  Discussed that given clinical presentation concerning for peptic ulcer disease as etiology of symptoms.  She was given GI cocktail in clinic with significant improvement of symptoms.  Recommended that she avoid spicy/acidic foods and avoid eating prior to bed.  She was given Carafate to be used 3 times a day to help manage her symptoms.  Recommended she follow-up with a GI specialist and was given contact information for local provider.  I discussed alarm symptoms that warrant emergent evaluation.  Strict return precautions given to which she expressed understanding.  Final Clinical Impressions(s) / UC Diagnoses   Final diagnoses:  Chest pain, unspecified type  Gastroesophageal reflux disease,  unspecified whether esophagitis present     Discharge Instructions      I am concerned that you have a stomach problem causing your symptoms since you had improvement with the medication we gave you.  Please start Carafate 3 times a day.  Avoid spicy/acidic/fatty foods.  Follow-up with a gastroenterologist for further evaluation.  If you have any recurrent chest pain, shortness of breath, blood in your stool, nausea/vomiting interfering with oral intake, significant abdominal pain you need to go to the emergency room for further evaluation.     ED Prescriptions     Medication Sig Dispense Auth. Provider   sucralfate (CARAFATE) 1 g tablet Take 1 tablet (1 g total) by mouth in the morning, at noon, and at bedtime. 30 tablet Liberato Stansbery, Noberto Retort, PA-C      PDMP not reviewed this encounter.   Jeani Hawking, PA-C 03/17/21 1840

## 2021-03-18 ENCOUNTER — Telehealth: Payer: Self-pay

## 2021-03-18 NOTE — Telephone Encounter (Signed)
Transition Care Management Follow-up Telephone Call Date of discharge and from where: 03/17/2021-[place] How have you been since you were released from the hospital? Patient stated she is doing better after being seen at the urgent care.

## 2021-03-25 ENCOUNTER — Ambulatory Visit (INDEPENDENT_AMBULATORY_CARE_PROVIDER_SITE_OTHER): Payer: Medicaid Other | Admitting: Internal Medicine

## 2021-03-25 ENCOUNTER — Encounter: Payer: Self-pay | Admitting: Internal Medicine

## 2021-03-25 VITALS — BP 102/62 | HR 103 | Ht 64.0 in | Wt 184.4 lb

## 2021-03-25 DIAGNOSIS — R1013 Epigastric pain: Secondary | ICD-10-CM | POA: Diagnosis not present

## 2021-03-25 NOTE — Patient Instructions (Signed)
If you are age 34 or older, your body mass index should be between 23-30. Your Body mass index is 31.65 kg/m. If this is out of the aforementioned range listed, please consider follow up with your Primary Care Provider.  If you are age 83 or younger, your body mass index should be between 19-25. Your Body mass index is 31.65 kg/m. If this is out of the aformentioned range listed, please consider follow up with your Primary Care Provider.   __________________________________________________________  The [facility] providers would like to encourage you to use [facility] to communicate with providers for non-urgent requests or questions.  Due to long hold times on the telephone, sending your provider a message by [facility] may be a faster and more efficient way to get a response.  Please allow 48 business hours for a response.  Please remember that this is for non-urgent requests.   Stay on the medicines your on currently.  You have been scheduled for an abdominal ultrasound at [facility] Radiology (1st floor of hospital) on 03/29/21 at 11:00am. Please arrive 15 minutes prior to your appointment for registration. Make certain not to have anything to eat or drink 6 hours prior to your appointment. Should you need to reschedule your appointment, please contact radiology at [phone]. This test typically takes about 30 minutes to perform.  I appreciate the opportunity to care for you. Stan Head, MD, [facility]

## 2021-03-25 NOTE — Progress Notes (Signed)
[name] 35 y.o. November 21, 1985 [id number] Referred by: Urgent care  Assessment & Plan:   Encounter Diagnosis  Name Primary?   Abdominal pain, epigastric Yes    This could have been alcoholic gastritis from her 5 shots of tequila though I think gallstones is still a possibility.  She is improving on sucralfate so we will continue that and I will schedule a right upper quadrant ultrasound.  If she has gallstones I would refer her to surgery.    Copy to Carollee Leitz, MD And Verna Czech PA-C  Subjective:   Chief Complaint: "They think I have a stomach ulcer"  HPI 35 year old African-American woman who presented to the emergency department (see paragraph below for HPI there), had lab evaluation there that was negative (CBC troponin lipase c-Met) who then went to urgent care where she was evaluated for lower chest pain.  It was thought maybe she had an ulcer and she was prescribed  Carafate 3 times daily.  March 17, 2021 ED triage note [name] , a 35 y.o. female  was evaluated in triage.  Pt complains of chest pain.  The patient has been having persistent central substernal chest pain that radiates through to her back for the last 5 days.  Pain is worse at nighttime and with eating.  She is also noted some pain with walking around.  She took 3 doses of naproxen on an empty stomach prior to onset of symptoms.  She also adds that she went to a birthday party about a week before her pain began where she drank 5 shots of tequila, which is significantly more than her usual social alcohol intake.  She denies shortness of breath, cough, fever, chills, dizziness, lightheadedness, numbness, weakness, paresthesias, melena, hematochezia.  She has been taking Gas-X without relief.   The patient reports that for about 5 to 7 days she was having intermittent pains during and after eating "felt like I was having a heart attack" with lower chest epigastric pain radiating into the  intrascapular area.  It would last for a few hours at times.  Since starting PPI and Carafate she is significantly better though the other day she had a piece of pizza and it set it off again.  She has never had ultrasound of the gallbladder and she has not had a cholecystectomy.  Prior to these symptoms that started within the last 2 to 3 weeks she did not have problems like this.   No dysphagia reported.  GI review of systems is otherwise negative.  Lab Results  Component Value Date   LIPASE 25 03/17/2021   Lab Results  Component Value Date   ALT 28 03/17/2021   AST 19 03/17/2021   ALKPHOS 86 03/17/2021   BILITOT 0.4 03/17/2021   Lab Results  Component Value Date   WBC 9.1 03/17/2021   HGB 13.1 03/17/2021   HCT 39.1 03/17/2021   MCV 94.0 03/17/2021   PLT 414 (H) 03/17/2021   High-sensitivity troponin normal hCG negative EKG had some J-point elevation in V2 otherwise negative  Allergies  Allergen Reactions   Ciprofloxacin Hives   Clindamycin/Lincomycin Hives   Penicillins Hives    "All '-cillins.'" Has patient had a PCN reaction causing immediate rash, facial/tongue/throat swelling, SOB or lightheadedness with hypotension: yes Has patient had a PCN reaction causing severe rash involving mucus membranes or skin necrosis: no Has patient had a PCN reaction that required hospitalization no Has patient had a PCN reaction occurring within the last 10  years: no If all of the above answers are "NO", then may proceed with Cephalosporin use.    Rocephin [Ceftriaxone] Rash   Current Meds  Medication Sig   sucralfate (CARAFATE) 1 g tablet Take 1 tablet (1 g total) by mouth in the morning, at noon, and at bedtime.   Past Medical History:  Diagnosis Date   Adenomyosis 2012   Planning hysterectomy    ADHD (attention deficit hyperactivity disorder)    NO MEDS   Allergy 1988   Hay fever since birth    Asthma 74   Thyromegaly    H/O   Past Surgical History:  Procedure  Laterality Date   ENDOMETRIAL ABLATION  2011   INCISE AND DRAIN ABCESS     surgical incision, wound produced mrsa   TUBAL LIGATION  2010   Social History   Social History Narrative   Customer service representative for [employer]    3 children     Jehovah Witness.    She smokes cigarettes, 2 caffeinated beverages a day averages 1 alcoholic beverage a day no other tobacco no drug use   family history includes Alzheimer's disease in her maternal grandfather; Depression in her mother and sister; Diabetes in her father; Ovarian cancer in an other family member; Testicular cancer in her cousin; Uterine cancer in an other family member.   Review of Systems Positive for some back pain and allergy problems all other review of systems are negative  Objective:   Physical Exam BP 102/62   Pulse (!) 103   Ht '5\' 4"'  (1.626 m)   Wt 184 lb 6 oz (83.6 kg)   BMI 31.65 kg/m  Well-developed well-nourished obese black woman in no acute distress Eyes anicteric Lungs are clear back is nontender no CVA tenderness Heart sounds normal S1-S2 no rubs murmurs or gallops Abdomen is obese soft and nontender without organomegaly or mass bowel sounds are present She is alert and oriented x3 and has an appropriate mood and affect

## 2021-03-29 ENCOUNTER — Other Ambulatory Visit: Payer: Self-pay

## 2021-03-29 ENCOUNTER — Ambulatory Visit (HOSPITAL_COMMUNITY)
Admission: RE | Admit: 2021-03-29 | Discharge: 2021-03-29 | Disposition: A | Discharge status: Home / Self Care | Payer: Medicaid Other | Source: Ambulatory Visit | Attending: Internal Medicine | Admitting: Internal Medicine

## 2021-03-29 DIAGNOSIS — R1013 Epigastric pain: Secondary | ICD-10-CM | POA: Insufficient documentation

## 2021-04-03 ENCOUNTER — Other Ambulatory Visit: Payer: Self-pay

## 2021-04-03 DIAGNOSIS — R1013 Epigastric pain: Secondary | ICD-10-CM

## 2021-04-11 ENCOUNTER — Ambulatory Visit (AMBULATORY_SURGERY_CENTER): Payer: Medicaid Other | Admitting: Internal Medicine

## 2021-04-11 ENCOUNTER — Encounter: Payer: Self-pay | Admitting: Internal Medicine

## 2021-04-11 VITALS — BP 113/46 | HR 82 | Temp 97.7°F | Resp 22 | Ht 64.0 in | Wt 184.0 lb

## 2021-04-11 DIAGNOSIS — K2970 Gastritis, unspecified, without bleeding: Secondary | ICD-10-CM | POA: Diagnosis not present

## 2021-04-11 DIAGNOSIS — B9681 Helicobacter pylori [H. pylori] as the cause of diseases classified elsewhere: Secondary | ICD-10-CM | POA: Diagnosis not present

## 2021-04-11 DIAGNOSIS — K2950 Unspecified chronic gastritis without bleeding: Secondary | ICD-10-CM | POA: Diagnosis not present

## 2021-04-11 DIAGNOSIS — K259 Gastric ulcer, unspecified as acute or chronic, without hemorrhage or perforation: Secondary | ICD-10-CM

## 2021-04-11 DIAGNOSIS — R1013 Epigastric pain: Secondary | ICD-10-CM

## 2021-04-11 MED ORDER — OMEPRAZOLE 40 MG PO CPDR: 40.0000 mg | DELAYED_RELEASE_CAPSULE | Freq: Every day | ORAL | 0 refills | Status: DC

## 2021-04-11 MED ORDER — SODIUM CHLORIDE 0.9 % IV SOLN: 500.0000 mL | INTRAVENOUS | Status: DC

## 2021-04-11 NOTE — Progress Notes (Signed)
Called to room to assist during endoscopic procedure.  Patient ID and intended procedure confirmed with present staff. Received instructions for my participation in the procedure from the performing physician.  

## 2021-04-11 NOTE — Patient Instructions (Addendum)
There is inflammation in the stomach - a tiny ulcer and what we call erosions. I took biopsies and will let you know results.  I have prescribed a new medication - omeprazole. Please start that to heal the ulcer and erosions.  I appreciate the opportunity to care for you. Iva Boop, MD, FACG   YOU HAD AN ENDOSCOPIC PROCEDURE TODAY AT THE [facility]:   Refer to the procedure report that was given to you for any specific questions about what was found during the examination.  If the procedure report does not answer your questions, please call your gastroenterologist to clarify.  If you requested that your care partner not be given the details of your procedure findings, then the procedure report has been included in a sealed envelope for you to review at your convenience later.  YOU SHOULD EXPECT: Some feelings of bloating in the abdomen. Passage of more gas than usual.  Walking can help get rid of the air that was put into your GI tract during the procedure and reduce the bloating. If you had a lower endoscopy (such as a colonoscopy or flexible sigmoidoscopy) you may notice spotting of blood in your stool or on the toilet paper. If you underwent a bowel prep for your procedure, you may not have a normal bowel movement for a few days.  Please Note:  You might notice some irritation and congestion in your nose or some drainage.  This is from the oxygen used during your procedure.  There is no need for concern and it should clear up in a day or so.  SYMPTOMS TO REPORT IMMEDIATELY:  Following upper endoscopy (EGD)  Vomiting of blood or coffee ground material  New chest pain or pain under the shoulder blades  Painful or persistently difficult swallowing  New shortness of breath  Fever of 100F or higher  Black, tarry-looking stools  For urgent or emergent issues, a gastroenterologist can be reached at any hour by calling [phone]. Do not use MyChart messaging for urgent  concerns.    DIET:  We do recommend a small meal at first, but then you may proceed to your regular diet.  Drink plenty of fluids but you should avoid alcoholic beverages for 24 hours.  ACTIVITY:  You should plan to take it easy for the rest of today and you should NOT DRIVE or use heavy machinery until tomorrow (because of the sedation medicines used during the test).    FOLLOW UP: Our staff will call the number listed on your records 48-72 hours following your procedure to check on you and address any questions or concerns that you may have regarding the information given to you following your procedure. If we do not reach you, we will leave a message.  We will attempt to reach you two times.  During this call, we will ask if you have developed any symptoms of COVID 19. If you develop any symptoms (ie: fever, flu-like symptoms, shortness of breath, cough etc.) before then, please call [phone].  If you test positive for Covid 19 in the 2 weeks post procedure, please call and report this information to [place].    If any biopsies were taken you will be contacted by phone or by letter within the next 1-3 weeks.  Please call us at [phone] if you have not heard about the biopsies in 3 weeks.    SIGNATURES/CONFIDENTIALITY: You and/or your care partner have signed paperwork which will be entered into  your electronic medical record.  These signatures attest to the fact that that the information above on your After Visit Summary has been reviewed and is understood.  Full responsibility of the confidentiality of this discharge information lies with you and/or your care-partner.

## 2021-04-11 NOTE — Progress Notes (Signed)
Sedate, gd SR, tolerated procedure well, VSS, report to RN 

## 2021-04-11 NOTE — Progress Notes (Signed)
Pt's states no medical or surgical changes since previsit or office visit. 

## 2021-04-11 NOTE — Op Note (Signed)
[facility] Patient Name: [name] Procedure Date: 04/11/2021 9:56 AM MRN: [id number] Endoscopist: Iva Boop , MD Age: 35 Referring MD:  Date of Birth: 1985-06-22 Gender: Female Account #: [id number] Procedure:                Upper GI endoscopy Indications:              Epigastric abdominal pain Medicines:                Propofol per Anesthesia, Monitored Anesthesia Care Procedure:                Pre-Anesthesia Assessment:                           - Prior to the procedure, a History and Physical                            was performed, and patient medications and                            allergies were reviewed. The patient's tolerance of                            previous anesthesia was also reviewed. The risks                            and benefits of the procedure and the sedation                            options and risks were discussed with the patient.                            All questions were answered, and informed consent                            was obtained. Prior Anticoagulants: The patient has                            taken no previous anticoagulant or antiplatelet                            agents. ASA Grade Assessment: II - A patient with                            mild systemic disease. After reviewing the risks                            and benefits, the patient was deemed in                            satisfactory condition to undergo the procedure.                           After obtaining informed consent, the endoscope was  passed under direct vision. Throughout the                            procedure, the patient's blood pressure, pulse, and                            oxygen saturations were monitored continuously. The                            GIF [id number] #[id number] was introduced through the                            mouth, and advanced to the second part of duodenum.                            The  upper GI endoscopy was accomplished without                            difficulty. The patient tolerated the procedure                            well. Scope In: Scope Out: Findings:                 One cratered gastric ulcer was found in the cardia.                            The lesion was 4 mm in largest dimension. Biopsies                            were taken with a cold forceps for histology.                            Verification of patient identification for the                            specimen was done. Estimated blood loss was minimal.                           A few localized small erosions with no stigmata of                            recent bleeding were found in the prepyloric region                            of the stomach. Biopsies were taken with a cold                            forceps for histology. Verification of patient                            identification for the specimen was done. Estimated  blood loss was minimal.                           The exam was otherwise without abnormality.                           The cardia and gastric fundus were normal on                            retroflexion. Complications:            No immediate complications. Estimated Blood Loss:     Estimated blood loss was minimal. Impression:               - Gastric ulcer. Biopsied.                           - Erosive gastropathy with no stigmata of recent                            bleeding. Biopsied.                           - The examination was otherwise normal. Recommendation:           - Patient has a contact number available for                            emergencies. The signs and symptoms of potential                            delayed complications were discussed with the                            patient. Return to normal activities tomorrow.                            Written discharge instructions were provided to the                             patient.                           - Resume previous diet.                           - Continue present medications.                           - Await pathology results.                           - Use Prilosec (omeprazole) 40 mg PO daily.                           - Await pathology results. Iva Boop, MD 04/11/2021 10:20:48 AM This report has been signed electronically.

## 2021-04-11 NOTE — Progress Notes (Signed)
History and Physical Interval Note:  04/11/2021 9:59 AM  [name]  has presented today for endoscopic procedure(s), with the diagnosis of  Encounter Diagnosis  Name Primary?   Abdominal pain, epigastric Yes  .  The various methods of treatment have been discussed with the patient and/or family. After consideration of risks, benefits and other options for treatment, the patient has consented to the procedure(s).  The patient's history has been reviewed, patient examined, no change in status, stable for surgery.  I have reviewed the patient's chart and labs.  Questions were answered to the patient's satisfaction.     Iva Boop, MD, Clementeen Graham

## 2021-04-15 ENCOUNTER — Encounter: Payer: Self-pay | Admitting: Internal Medicine

## 2021-04-15 ENCOUNTER — Telehealth: Payer: Self-pay

## 2021-04-15 DIAGNOSIS — B9681 Helicobacter pylori [H. pylori] as the cause of diseases classified elsewhere: Secondary | ICD-10-CM

## 2021-04-15 DIAGNOSIS — K2970 Gastritis, unspecified, without bleeding: Secondary | ICD-10-CM

## 2021-04-15 HISTORY — DX: Helicobacter pylori (H. pylori) as the cause of diseases classified elsewhere: B96.81

## 2021-04-15 HISTORY — DX: Gastritis, unspecified, without bleeding: K29.70

## 2021-04-15 NOTE — Telephone Encounter (Signed)
First post procedure follow up call, no answer 

## 2021-04-15 NOTE — Telephone Encounter (Signed)
Attempted to reach patient for post-procedure f/u call. No answer. Left message for her to please not hesitate to call us if she has any questions/concerns regarding her care. 

## 2021-04-16 ENCOUNTER — Other Ambulatory Visit: Payer: Self-pay

## 2021-04-16 DIAGNOSIS — B9689 Other specified bacterial agents as the cause of diseases classified elsewhere: Secondary | ICD-10-CM

## 2021-04-16 DIAGNOSIS — K2970 Gastritis, unspecified, without bleeding: Secondary | ICD-10-CM

## 2021-04-16 MED ORDER — METRONIDAZOLE 250 MG PO TABS: 250.0000 mg | ORAL_TABLET | Freq: Four times a day (QID) | ORAL | 0 refills | Status: AC

## 2021-04-16 MED ORDER — DOXYCYCLINE HYCLATE 100 MG PO TABS: 100.0000 mg | ORAL_TABLET | Freq: Two times a day (BID) | ORAL | 0 refills | Status: AC

## 2021-04-16 MED ORDER — OMEPRAZOLE 40 MG PO CPDR: 40.0000 mg | DELAYED_RELEASE_CAPSULE | Freq: Two times a day (BID) | ORAL | 0 refills | Status: DC

## 2021-04-16 MED ORDER — BISMUTH SUBSALICYLATE 262 MG PO CHEW
524.0000 mg | CHEWABLE_TABLET | Freq: Four times a day (QID) | ORAL | 0 refills | Status: AC
Start: 2021-04-16 — End: 2021-04-30

## 2021-05-16 ENCOUNTER — Encounter (HOSPITAL_COMMUNITY): Payer: Self-pay

## 2021-05-16 ENCOUNTER — Ambulatory Visit (HOSPITAL_COMMUNITY)
Admission: EM | Admit: 2021-05-16 | Discharge: 2021-05-16 | Disposition: A | Discharge status: Home / Self Care | Payer: Medicaid Other | Attending: Family Medicine | Admitting: Family Medicine

## 2021-05-16 ENCOUNTER — Other Ambulatory Visit: Payer: Self-pay

## 2021-05-16 DIAGNOSIS — U071 COVID-19: Secondary | ICD-10-CM | POA: Diagnosis not present

## 2021-05-16 NOTE — ED Triage Notes (Signed)
Pt presents today with chills, sore throat for several days. She took a home covid test and it was positive. Patient will a nite to return back to work.

## 2021-05-16 NOTE — ED Provider Notes (Signed)
[facility]   [id number] 05/16/21 Arrival Time: 1515  ASSESSMENT & PLAN:  1. COVID-19 virus infection     Discussed typical duration of viral illnesses. Work note provided. Prefers OTC symptom care as needed.   Follow-up Information     Dana Allan, MD.   Specialty: Family Medicine Why: If worsening or failing to improve as anticipated. Contact information: [address] [place] [address] [phone]                 Reviewed expectations re: course of current medical issues. Questions answered. Outlined signs and symptoms indicating need for more acute intervention. Understanding verbalized. After Visit Summary given.   SUBJECTIVE: History from: patient. Wilburta Milbourn is a 35 y.o. female who reports: chills, ST, cough, body aches; several days. Denies: difficulty breathing. Normal PO intake without n/v/d. Home COVID test + today.  OBJECTIVE:  Vitals:   05/16/21 1629  BP: 117/84  Pulse: 69  Resp: 16  Temp: 98.3 F (36.8 C)  SpO2: 100%    General appearance: alert; no distress Eyes: PERRLA; EOMI; conjunctiva normal HENT: [place]; AT; with nasal congestion Neck: supple  Lungs: speaks full sentences without difficulty; unlabored; clear Extremities: no edema Skin: warm and dry Neurologic: normal gait Psychological: alert and cooperative; normal mood and affect  Allergies  Allergen Reactions   Ciprofloxacin Hives   Clindamycin/Lincomycin Hives   Penicillins Hives    "All '-cillins.'" Has patient had a PCN reaction causing immediate rash, facial/tongue/throat swelling, SOB or lightheadedness with hypotension: yes Has patient had a PCN reaction causing severe rash involving mucus membranes or skin necrosis: no Has patient had a PCN reaction that required hospitalization no Has patient had a PCN reaction occurring within the last 10 years: no If all of the above answers are "NO", then may proceed with Cephalosporin use.    Rocephin  [Ceftriaxone] Rash    Past Medical History:  Diagnosis Date   Adenomyosis 2012   Planning hysterectomy    ADHD (attention deficit hyperactivity disorder)    NO MEDS   AKI (acute kidney injury) (HCC) 06/02/2015   Allergy 1988   Hay fever since birth    Asthma 1988   BV (bacterial vaginosis) 10/07/2018   Helicobacter pylori gastritis 04/15/2021   Pyelonephritis 06/02/2015   Sepsis secondary to UTI (HCC) 06/05/2015   Thyromegaly    H/O   Social History   Socioeconomic History   Marital status: Single    Spouse name: Not on file   Number of children: 3   Years of education: some HS   Highest education level: Not on file  Occupational History   Occupation: Unemployed     Employer: UNEMPLOYED  Tobacco Use   Smoking status: Some Days    Packs/day: 0.25    Types: Cigarettes   Smokeless tobacco: Never  Vaping Use   Vaping Use: Never used  Substance and Sexual Activity   Alcohol use: Yes    Alcohol/week: 1.0 standard drink    Types: 1 Cans of beer per week    Comment: ocassionally   Drug use: No   Sexual activity: Yes    Birth control/protection: Condom, Surgical    Comment: BTL  Other Topics Concern   Not on file  Social History Narrative   Customer service representative for [employer]    3 children     Jehovah Witness.    She smokes cigarettes, 2 caffeinated beverages a day averages 1 alcoholic beverage a day no other  tobacco no drug use   Social Determinants of Corporate investment banker Strain: Not on file  Food Insecurity: Not on file  Transportation Needs: Not on file  Physical Activity: Not on file  Stress: Not on file  Social Connections: Not on file  Intimate Partner Violence: Not on file   Family History  Problem Relation Age of Onset   Alzheimer's disease Maternal Grandfather    Depression Sister    Depression Mother    Diabetes Father    Ovarian cancer Other        maternal great grandmother   Uterine cancer Other        maternal great  aunt   Testicular cancer Cousin        maternal cousin   Past Surgical History:  Procedure Laterality Date   ENDOMETRIAL ABLATION  2011   INCISE AND DRAIN ABCESS     surgical incision, wound produced mrsa   TUBAL LIGATION  2010     Mardella Layman, MD 05/16/21 1714

## 2021-07-10 ENCOUNTER — Other Ambulatory Visit: Payer: Self-pay | Admitting: Internal Medicine

## 2021-11-12 ENCOUNTER — Encounter: Payer: Self-pay | Admitting: *Deleted

## 2022-04-29 ENCOUNTER — Ambulatory Visit (HOSPITAL_COMMUNITY)
Admission: EM | Admit: 2022-04-29 | Discharge: 2022-04-29 | Disposition: A | Discharge status: Home / Self Care | Payer: Medicaid Other | Attending: Emergency Medicine | Admitting: Emergency Medicine

## 2022-04-29 ENCOUNTER — Encounter (HOSPITAL_COMMUNITY): Payer: Self-pay | Admitting: Emergency Medicine

## 2022-04-29 DIAGNOSIS — Z1152 Encounter for screening for COVID-19: Secondary | ICD-10-CM | POA: Insufficient documentation

## 2022-04-29 DIAGNOSIS — B349 Viral infection, unspecified: Secondary | ICD-10-CM | POA: Insufficient documentation

## 2022-04-29 LAB — RESP PANEL BY RT-PCR (FLU A&B, COVID) ARPGX2
Influenza A by PCR: POSITIVE — AB
Influenza B by PCR: NEGATIVE
SARS Coronavirus 2 by RT PCR: NEGATIVE

## 2022-04-29 MED ORDER — CETIRIZINE HCL 10 MG PO TABS: 10.0000 mg | ORAL_TABLET | Freq: Every day | ORAL | 2 refills | Status: DC

## 2022-04-29 NOTE — ED Triage Notes (Signed)
Pt reports a cough that began Saturday and cough resolved Sunday night. States last night she began sneezing, having pain on the ride side of throat, headache on the right side, fatigue and fever.  Has been taking Mucinex, and fast max kick start.

## 2022-04-29 NOTE — Discharge Instructions (Addendum)
We will call you if your covid/flu test returns positive.  Continue symptomatic care at home - lots of fluids, ibuprofen or tylenol, daily allergy medicine

## 2022-04-29 NOTE — ED Provider Notes (Signed)
[facility]    CSN: [id number] Arrival date & time: 04/29/22  0813      History   Chief Complaint Chief Complaint  Patient presents with   Headache   Nasal Congestion    HPI [name] is a 36 y.o. female.  Presents with multiple symptoms that began last night Right ear pain, mild headache, scratchy throat, fatigue Some nasal congestion Reports 102 fever last night, responded to Tylenol Cough 2 nights ago that resolved on its own  Has tried Mucinex, lots of fluids Denies sick contacts  Past Medical History:  Diagnosis Date   Adenomyosis 2012   Planning hysterectomy    ADHD (attention deficit hyperactivity disorder)    NO MEDS   AKI (acute kidney injury) (HCC) 06/02/2015   Allergy 1988   Hay fever since birth    Asthma 1988   BV (bacterial vaginosis) 10/07/2018   Helicobacter pylori gastritis 04/15/2021   Pyelonephritis 06/02/2015   Sepsis secondary to UTI (HCC) 06/05/2015   Thyromegaly    H/O    Patient Active Problem List   Diagnosis Date Noted   Helicobacter pylori gastritis 04/15/2021   ADHD (attention deficit hyperactivity disorder) 09/03/2012   Chronic abdominal pain 05/27/2012   Decreased vision 03/19/2012   Tobacco abuse 03/19/2012   Refusal of blood transfusions as patient is Jehovah's Witness 11/11/2011   Adenomyosis    Asthma     Past Surgical History:  Procedure Laterality Date   ENDOMETRIAL ABLATION  2011   INCISE AND DRAIN ABCESS     surgical incision, wound produced mrsa   TUBAL LIGATION  2010    OB History     Gravida  3   Para  3   Term  3   Preterm      AB      Living  3      SAB      IAB      Ectopic      Multiple      Live Births  3            Home Medications    Prior to Admission medications   Medication Sig Start Date End Date Taking? Authorizing Provider  cetirizine (ZYRTEC ALLERGY) 10 MG tablet Take 1 tablet (10 mg total) by mouth daily. 04/29/22  Yes Mikya Don, Lurena Joiner, PA-C   Fluticasone Propionate (FLONASE NA) Place 1 tablet into the nose as needed.    [provider]  omeprazole (PRILOSEC) 40 MG capsule Take 1 capsule (40 mg total) by mouth 2 (two) times daily for 14 days. 04/16/21 04/30/21  Iva Boop, MD    Family History Family History  Problem Relation Age of Onset   Alzheimer's disease Maternal Grandfather    Depression Sister    Depression Mother    Diabetes Father    Ovarian cancer Other        maternal great grandmother   Uterine cancer Other        maternal great aunt   Testicular cancer Cousin        maternal cousin    Social History Social History   Tobacco Use   Smoking status: Some Days    Packs/day: 0.25    Types: Cigarettes   Smokeless tobacco: Never  Vaping Use   Vaping Use: Never used  Substance Use Topics   Alcohol use: Yes    Alcohol/week: 1.0 standard drink of alcohol    Types: 1 Cans of beer per week  Comment: ocassionally   Drug use: No     Allergies   Ciprofloxacin, Clindamycin/lincomycin, Penicillins, and Rocephin [ceftriaxone]   Review of Systems Review of Systems  As per HPI  Physical Exam Triage Vital Signs ED Triage Vitals  Enc Vitals Group     BP 04/29/22 0842 (!) 110/59     Pulse Rate 04/29/22 0842 96     Resp 04/29/22 0842 18     Temp 04/29/22 0842 98.7 F (37.1 C)     Temp Source 04/29/22 0842 Oral     SpO2 04/29/22 0842 99 %     Weight --      Height --      Head Circumference --      Peak Flow --      Pain Score 04/29/22 0841 8     Pain Loc --      Pain Edu? --      Excl. in [place]? --    No data found.  Updated Vital Signs BP (!) 110/59 (BP Location: Right Arm)   Pulse 96   Temp 98.7 F (37.1 C) (Oral)   Resp 18   SpO2 99%    Physical Exam Vitals reviewed.  Constitutional:      General: She is not in acute distress.    Appearance: Normal appearance. She is not ill-appearing.  HENT:     Right Ear: Tympanic membrane and ear canal normal.     Left Ear:  Tympanic membrane and ear canal normal.     Nose: Septal deviation and congestion present.     Comments: Septal deviation to the right    Mouth/Throat:     Mouth: Mucous membranes are moist.     Pharynx: Oropharynx is clear. No oropharyngeal exudate or posterior oropharyngeal erythema.  Eyes:     Conjunctiva/sclera: Conjunctivae normal.  Cardiovascular:     Rate and Rhythm: Normal rate and regular rhythm.     Pulses: Normal pulses.     Heart sounds: Normal heart sounds.  Pulmonary:     Effort: Pulmonary effort is normal.     Breath sounds: Normal breath sounds.  Musculoskeletal:     Cervical back: Normal range of motion.  Lymphadenopathy:     Cervical: No cervical adenopathy.  Skin:    General: Skin is warm and dry.  Neurological:     Mental Status: She is alert and oriented to person, place, and time.     UC Treatments / Results  Labs (all labs ordered are listed, but only abnormal results are displayed) Labs Reviewed  RESP PANEL BY RT-PCR (FLU A&B, COVID) [id number]    EKG   Radiology No results found.  Procedures Procedures (including critical care time)  Medications Ordered in UC Medications - No data to display  Initial Impression / Assessment and Plan / UC Course  I have reviewed the triage vital signs and the nursing notes.  Pertinent labs & imaging results that were available during my care of the patient were reviewed by me and considered in my medical decision making (see chart for details).  Afebrile here, exam unremarkable COVID/flu pending.  If positive, qualifies for antivirals.  GFR > 60 Discussed symptomatic care at home Refilled allergy medicine, recommend take daily in combination with nasal spray Tylenol and ibuprofen for pain or fever Continue fluids Return precautions discussed. Patient agrees to plan  Final Clinical Impressions(s) / UC Diagnoses   Final diagnoses:  Viral illness     Discharge Instructions  We will call you if  your covid/flu test returns positive.  Continue symptomatic care at home - lots of fluids, ibuprofen or tylenol, daily allergy medicine     ED Prescriptions     Medication Sig Dispense Auth. Provider   cetirizine (ZYRTEC ALLERGY) 10 MG tablet Take 1 tablet (10 mg total) by mouth daily. 30 tablet Shavaughn Seidl, Wells Guiles, PA-C      PDMP not reviewed this encounter.   Les Pou, [place] 04/29/22 [id number]

## 2022-09-25 DIAGNOSIS — M542 Cervicalgia: Secondary | ICD-10-CM | POA: Diagnosis not present

## 2022-10-14 ENCOUNTER — Emergency Department (HOSPITAL_COMMUNITY)
Admission: EM | Admit: 2022-10-14 | Discharge: 2022-10-14 | Disposition: A | Discharge status: Home / Self Care | Payer: Medicaid Other | Attending: Emergency Medicine | Admitting: Emergency Medicine

## 2022-10-14 ENCOUNTER — Other Ambulatory Visit: Payer: Self-pay

## 2022-10-14 ENCOUNTER — Encounter (HOSPITAL_COMMUNITY): Payer: Self-pay

## 2022-10-14 ENCOUNTER — Encounter: Payer: Self-pay | Admitting: Physical Therapy

## 2022-10-14 ENCOUNTER — Ambulatory Visit: Payer: Medicaid Other | Attending: Physician Assistant | Admitting: Physical Therapy

## 2022-10-14 DIAGNOSIS — M542 Cervicalgia: Secondary | ICD-10-CM

## 2022-10-14 DIAGNOSIS — R102 Pelvic and perineal pain: Secondary | ICD-10-CM | POA: Diagnosis not present

## 2022-10-14 DIAGNOSIS — N939 Abnormal uterine and vaginal bleeding, unspecified: Secondary | ICD-10-CM | POA: Insufficient documentation

## 2022-10-14 DIAGNOSIS — R293 Abnormal posture: Secondary | ICD-10-CM | POA: Insufficient documentation

## 2022-10-14 DIAGNOSIS — M5459 Other low back pain: Secondary | ICD-10-CM

## 2022-10-14 HISTORY — DX: Spondylolysis, lumbar region: M43.06

## 2022-10-14 LAB — URINALYSIS, ROUTINE W REFLEX MICROSCOPIC
Bacteria, UA: NONE SEEN
Bilirubin Urine: NEGATIVE
Glucose, UA: NEGATIVE mg/dL
Ketones, ur: NEGATIVE mg/dL
Leukocytes,Ua: NEGATIVE
Nitrite: NEGATIVE
Protein, ur: NEGATIVE mg/dL
Specific Gravity, Urine: 1.024 (ref 1.005–1.030)
pH: 5 (ref 5.0–8.0)

## 2022-10-14 LAB — I-STAT CHEM 8, ED
BUN: 9 mg/dL (ref 6–20)
Calcium, Ion: 1.1 mmol/L — ABNORMAL LOW (ref 1.15–1.40)
Chloride: 104 mmol/L (ref 98–111)
Creatinine, Ser: 0.7 mg/dL (ref 0.44–1.00)
Glucose, Bld: 88 mg/dL (ref 70–99)
HCT: 42 % (ref 36.0–46.0)
Hemoglobin: 14.3 g/dL (ref 12.0–15.0)
Potassium: 4.3 mmol/L (ref 3.5–5.1)
Sodium: 138 mmol/L (ref 135–145)
TCO2: 26 mmol/L (ref 22–32)

## 2022-10-14 LAB — I-STAT BETA HCG BLOOD, ED (MC, WL, AP ONLY): I-stat hCG, quantitative: <5 m[IU]/mL (ref <5)

## 2022-10-14 NOTE — ED Provider Notes (Signed)
[place] EMERGENCY DEPARTMENT AT [facility] Provider Note   CSN: [id number] Arrival date & time: 10/14/22  1522     History  Chief Complaint  Patient presents with   Abdominal Pain    [name] is a 37 y.o. female.  Significant chronic PMH.  Presents the ER today for vaginal spotting and some pelvic cramping for the past several days.  She states she has not had a menstrual period in about 13 years since she had a uterine ablation for heavy periods and uterine adenomyosis.   States she has had no menstrual period since then but her OB/GYN did tell her her uterine lining was started to come back.  She states that she been having some nausea and breast tenderness the past several days and that she could be pregnant.  Has not taken a home test.  She started having pink to brown vaginal spotting intermittently since then.  She states that her family urged her to come be evaluated since it has been going on for couple of days.  Denies any heavy bleeding, dizziness, vomiting.  She does states she is having some lower abdominal cramping, denies concern for STI.  Abdominal Pain      Home Medications Prior to Admission medications   Medication Sig Start Date End Date Taking? Authorizing Provider  cetirizine (ZYRTEC ALLERGY) 10 MG tablet Take 1 tablet (10 mg total) by mouth daily. 04/29/22   Rising, Lurena Joiner, PA-C  Fluticasone Propionate (FLONASE NA) Place 1 tablet into the nose as needed.    [provider]  omeprazole (PRILOSEC) 40 MG capsule Take 1 capsule (40 mg total) by mouth 2 (two) times daily for 14 days. 04/16/21 04/30/21  Iva Boop, MD      Allergies    Ciprofloxacin, Clindamycin/lincomycin, Penicillins, and Rocephin [ceftriaxone]    Review of Systems   Review of Systems  Gastrointestinal:  Positive for abdominal pain.    Physical Exam Updated Vital Signs BP 128/63   Pulse 85   Temp 98 F (36.7 C) (Oral)   Resp 18   Ht 5\' 4"  (1.626 m)    Wt 83.5 kg   SpO2 100%   BMI 31.60 kg/m  Physical Exam Vitals and nursing note reviewed.  Constitutional:      General: She is not in acute distress.    Appearance: She is well-developed.  HENT:     Head: Normocephalic and atraumatic.  Eyes:     Conjunctiva/sclera: Conjunctivae normal.  Cardiovascular:     Rate and Rhythm: Normal rate and regular rhythm.     Heart sounds: No murmur heard. Pulmonary:     Effort: Pulmonary effort is normal. No respiratory distress.     Breath sounds: Normal breath sounds.  Abdominal:     General: Abdomen is flat.     Palpations: Abdomen is soft.     Tenderness: There is no abdominal tenderness.  Musculoskeletal:        General: No swelling.     Cervical back: Neck supple.  Skin:    General: Skin is warm and dry.     Capillary Refill: Capillary refill takes less than 2 seconds.  Neurological:     General: No focal deficit present.     Mental Status: She is alert.  Psychiatric:        Mood and Affect: Mood normal.     ED Results / Procedures / Treatments   Labs (all labs ordered are listed, but only abnormal results  are displayed) Labs Reviewed  I-STAT CHEM 8, ED - Abnormal; Notable for the following components:      Result Value   Calcium, Ion 1.10 (*)    All other components within normal limits  COMPREHENSIVE METABOLIC PANEL  CBC WITH DIFFERENTIAL/PLATELET  URINALYSIS, ROUTINE W REFLEX MICROSCOPIC  I-STAT BETA HCG BLOOD, ED ([facility], [facility], [facility] ONLY)    EKG None  Radiology No results found.  Procedures Procedures    Medications Ordered in ED Medications - No data to display  ED Course/ Medical Decision Making/ A&P                             Medical Decision Making This patient presents to the ED for concern of pelvic pain and vaginal spotting, this involves an extensive number of treatment options, and is a complaint that carries with it a high risk of complications and morbidity.  The differential diagnosis includes but  pregnancy, miscarriage, ovarian torsion, ovarian cyst, uterine fibroids, dysfunctional uterine bleeding, endometriosis, other   Co morbidities that complicate the patient evaluation  Hx of endometrial ablation   Additional history obtained:  Additional history obtained from EMR External records from outside source obtained and reviewed including prior urgent care/ED/GYN note   Lab Tests:  I Ordered, and personally interpreted labs.  The pertinent results include: Not pregnant, hemoglobin is 14.3      Problem List / ED Course / Critical interventions / Medication management  Patient presents for some intermittent vaginal spotting and some pelvic cramping for past couple of days.  She states that she would make sure she was not pregnant, family encouraged her to come get checked out.  She denies fever chills, denies any heavy bleeding, not needing to wear a pad or tampon for the leading.  He has normal hemoglobin, she is not pregnant.  Appearing well-hydrated with soft nontender abdomen, states that it feels crampy on the inside but has no tenderness to palpation. After pelvic exam and pelvic ultrasound but patient states she was at home and eat, reassured that she is not pregnant, states she needs referral to new OB/GYN as her previous did not take her Medicaid.  She is very well-appearing, normal vitals.  Discussed OTC medications as needed for pain was given strict follow-up and return precautions I have reviewed the patients home medicines and have made adjustments as needed    Test / Admission - Considered:  Pelvic Ultrasound considered-patient does not want to wait for this, she is comfortable sitting in bed on her cell phone, feel referral to outpatient is reasonable    Amount and/or Complexity of Data Reviewed Labs: ordered.           Final Clinical Impression(s) / ED Diagnoses Final diagnoses:  None    Rx / DC Orders ED Discharge Orders     None          Josem Kaufmann 10/14/22 1757    Wynetta Fines, MD 10/14/22 2355

## 2022-10-14 NOTE — ED Triage Notes (Signed)
Pt c/o pelvic cramping started yesterday. Pt states yesterday pt has pink vaginal bleeding then it turned to dark coffee grounds then back to pink bleeding. Pt states she had an endometrial ablation thirteen years ago and hasn't had a menstrual cycle since. Pt c/o nausea, breast tendernessx3d.

## 2022-10-14 NOTE — Discharge Instructions (Signed)
Seen today for vaginal bleeding and pelvic cramping.  Your blood work was reassuring.  You are not anemic.  You do not have a UTI.  You are not pregnant.  Please follow-up with gynecology, come back if you have severe pain, fever, heavy bleeding, dizziness, vomiting or any other new or worsening symptoms

## 2022-10-14 NOTE — Therapy (Signed)
OUTPATIENT PHYSICAL THERAPY CERVICAL EVALUATION   Patient Name: [name] MRN: [id number] DOB:1986/01/12, 37 y.o., female Today's Date: 10/14/2022  END OF SESSION:  PT End of Session - 10/14/22 1409     Visit Number 1    Number of Visits 12    Date for PT Re-Evaluation 11/25/22    Authorization Type [place] [employer]    Authorization - Number of Visits 27    PT Start Time 1404    PT Stop Time 1448    PT Time Calculation (min) 44 min    Activity Tolerance Patient tolerated treatment well             Past Medical History:  Diagnosis Date   Adenomyosis 2012   Planning hysterectomy    ADHD (attention deficit hyperactivity disorder)    NO MEDS   AKI (acute kidney injury) (HCC) 06/02/2015   Allergy 1988   Hay fever since birth    Asthma 1988   BV (bacterial vaginosis) 10/07/2018   Helicobacter pylori gastritis 04/15/2021   Lumbar spondylolysis    Pyelonephritis 06/02/2015   Sepsis secondary to UTI (HCC) 06/05/2015   Thyromegaly    H/O   Past Surgical History:  Procedure Laterality Date   ENDOMETRIAL ABLATION  2011   INCISE AND DRAIN ABCESS     surgical incision, wound produced mrsa   TUBAL LIGATION  2010   Patient Active Problem List   Diagnosis Date Noted   Helicobacter pylori gastritis 04/15/2021   ADHD (attention deficit hyperactivity disorder) 09/03/2012   Chronic abdominal pain 05/27/2012   Decreased vision 03/19/2012   Tobacco abuse 03/19/2012   Refusal of blood transfusions as patient is Jehovah's Witness 11/11/2011   Adenomyosis    Asthma     PCP: Jerre Simon MD   REFERRING PROVIDER: Margart Sickles   REFERRING DIAG: neck pain   THERAPY DIAG:  Cervicalgia  Other low back pain  Abnormal posture  Rationale for Evaluation and Treatment: Rehabilitation  ONSET DATE: 09/23/22  SUBJECTIVE:                                                                                                                                                                                                          SUBJECTIVE STATEMENT: Pt reports she woke up 4/16 with a crook in her neck.  For the rest of the day it progressed and it became severe to where her Rt UE was "paralyzed" as well as her Rt LE.  She reports she is more limited in her lower back.  Her back pain  is chronic and it radiates into her right lower extremity.  The prednisone and (injections) calmed it down. She does feel better now.  She get a twinge if she moves a certain way. I am started to notice muscle spasm in leg and arm. She has this many times a day.  Due to her back pain, she has difficulty working, she barely stand to cook a meal.  She is unable to do the job she wants to do because of her pain.  She has an accomodation but that is still hard.  She has difficulty sitting and travelling as well.  Overall she is very frustrated and feels that she is too old to be limited in very basic personal activities.  Hand dominance: Right  PERTINENT HISTORY:  Allergies  Chronic low back pain (spondylolisthesis?) PAIN:  Are you having pain? Yes: NPRS scale: 1/10 Pain location: back bilateral  Pain description: sore, stabbing , tingling  Aggravating factors: standing, sitting Relieving factors: really nothing much    Yes: NPRS scale: 0/10 Pain location: neck  Pain description: tight  Aggravating factors: not hurt  Relieving factors: meds   PRECAUTIONS: None  WEIGHT BEARING RESTRICTIONS: No  FALLS:  Has patient fallen in last 6 months? No  LIVING ENVIRONMENT: Lives with: lives with their family kids 8 , 64, 74  Lives in: House/apartment Stairs: Yes: Internal: 14 steps; on right going up and External: 4 steps; on right going up Has following equipment at home: None  OCCUPATION: Not currently working   PLOF: Independent Son helps carrying items up and down the stairs   PATIENT GOALS: I want to be able to do normal daily activities with no problems   NEXT MD VISIT: unknown    OBJECTIVE:   DIAGNOSTIC FINDINGS:  None available   PATIENT SURVEYS:  NDI 44% (22/50)  COGNITION: Overall cognitive status: Within functional limits for tasks assessed  SENSATION: WFL Numbness in Rt UE and Rt LE at times   POSTURE: rounded shoulders and forward head  PALPATION: Pain with palpation to bilateral suboccipitals and posterior cervicals.      CERVICAL ROM:   Active ROM A/PROM (deg) eval  Flexion 30  Extension 55  Right lateral flexion 45  Left lateral flexion 35  Right rotation WFL twinge   Left rotation WFL tight on L    (Blank rows = not tested)  UPPER EXTREMITY ROM: WNL   Active ROM Right eval Left eval  Shoulder flexion    Shoulder extension    Shoulder abduction    Shoulder adduction    Shoulder extension    Shoulder internal rotation    Shoulder external rotation    Elbow flexion    Elbow extension    Wrist flexion    Wrist extension    Wrist ulnar deviation    Wrist radial deviation    Wrist pronation    Wrist supination     (Blank rows = not tested)  UPPER EXTREMITY MMT:  MMT Right eval Left eval  Shoulder flexion 4 4  Shoulder extension    Shoulder abduction 4 4  Shoulder adduction    Shoulder extension    Shoulder internal rotation    Shoulder external rotation    Middle trapezius    Lower trapezius    Elbow flexion 4+ 4+  Elbow extension 4+ 4+  Wrist flexion    Wrist extension    Wrist ulnar deviation    Wrist radial deviation    Wrist pronation  Wrist supination    Grip strength     (Blank rows = not tested)  CERVICAL SPECIAL TESTS:  NT  FUNCTIONAL TESTS:  NT on eval   TODAY'S TREATMENT:                                                                                                                              DATE: 10/14/2022  PT, POC, HEP and inclusion of low back in PT Head posture, weight of head/hair and effect on neck pain   PATIENT EDUCATION:  Education details: HEP, POC , core   Person  educated: Patient Education method: Explanation, Demonstration, and Handouts Education comprehension: verbalized understanding, returned demonstration, and needs further education  HOME EXERCISE PROGRAM: Access Code: [email or web address] URL: [email or web address] Date: 10/14/2022 Prepared by: Karie Mainland  Exercises - Supine Cervical Retraction with Towel  - 2 x daily - 7 x weekly - 2 sets - 10 reps - 5 hold - Hooklying Transversus Abdominis Palpation  - 2 x daily - 7 x weekly - 2 sets - 10 reps - 5 hold - Supine Cervical Rotation AROM on Pillow  - 2 x daily - 7 x weekly - 2 sets - 10 reps - 5 hold  ASSESSMENT:  CLINICAL IMPRESSION: Patient is a 37 y.o. female who was seen today for physical therapy evaluation and treatment for neck and low back pain.  Neck pain is new she would like to have her lower back addressed but we will wait to do that until certification comes back.   OBJECTIVE IMPAIRMENTS: decreased activity tolerance, decreased mobility, difficulty walking, decreased ROM, decreased strength, increased fascial restrictions, increased muscle spasms, impaired UE functional use, postural dysfunction, obesity, and pain.   ACTIVITY LIMITATIONS: carrying, lifting, bending, sitting, standing, squatting, sleeping, stairs, bed mobility, hygiene/grooming, locomotion level, and caring for others  PARTICIPATION LIMITATIONS: interpersonal relationship, community activity, and occupation  PERSONAL FACTORS: Past/current experiences and 1-2 comorbidities: lumbar spondylolysis  are also affecting patient's functional outcome.   REHAB POTENTIAL: Good  CLINICAL DECISION MAKING: Evolving/moderate complexity  EVALUATION COMPLEXITY: Moderate   GOALS: Goals reviewed with patient? No   LONG TERM GOALS: Target date: 11/25/2022    Pt will be I with HEP for cervical and lumbar stabilization, trunk flexibility  Baseline: unknown  Goal status: INITIAL  2.  Pt will be able to sleep > 5  hours per night due to less pain overall  Baseline: 3-5 hrs  Goal status: INITIAL  3.  Pt will be able to demo normal cervical AROM in all planes  Baseline: limited in all planes with pain  Goal status: INITIAL  4.  Pt will be able to lift items to shoulder hgt with no increase in neck pain  Baseline: painful  Goal status: INITIAL  5. NDI score will improve to 34% to demo improved functional mobility.   Baseline: 44%  Goal status: INITIAL    PLAN:  PT FREQUENCY: 2x/week  PT DURATION: 6  weeks  PLANNED INTERVENTIONS: Therapeutic exercises, Therapeutic activity, Neuromuscular re-education, Patient/Family education, Self Care, Joint mobilization, Dry Needling, Electrical stimulation, Spinal manipulation, Spinal mobilization, Cryotherapy, Moist heat, Manual therapy, and Re-evaluation  PLAN FOR NEXT SESSION: check HEP, eval Lumbar if signed.  , manual if tolerate, posture    Godric Lavell, PT 10/14/2022, 3:43 PM   Check all possible CPT codes: 47829 - PT Re-evaluation, 97110- Therapeutic Exercise, [phone]- Neuro Re-education, [phone] - Gait Training, [phone] - Manual Therapy, 97530 - Therapeutic Activities, 97535 - Self Care, [phone] - Mechanical traction, 97014 - Electrical stimulation (unattended), 97750 - Physical performance training, and [phone] - Aquatic therapy    Check all conditions that are expected to impact treatment: {Conditions expected to impact treatment:None of these apply   If treatment provided at initial evaluation, no treatment charged due to lack of authorization.      Karie Mainland, PT 10/14/22 4:19 PM Phone: [phone] Fax: [phone]

## 2022-10-15 LAB — GC/CHLAMYDIA PROBE AMP (~~LOC~~) NOT AT ARMC
Chlamydia: NEGATIVE
Comment: NEGATIVE
Comment: NORMAL
Neisseria Gonorrhea: NEGATIVE

## 2022-10-21 NOTE — Therapy (Signed)
OUTPATIENT PHYSICAL THERAPY TREATMENT NOTE/Lumbar Eval   Patient Name: Yahayra Saintvil MRN: [id number] DOB:[birth year], 37 y.o., female Today's Date: 10/23/2022  PCP: PCP: Jerre Simon MD   REFERRING PROVIDER: REFERRING PROVIDER: Margart Sickles   END OF SESSION:   PT End of Session - 10/22/22 1603     Visit Number 2    Number of Visits 12    Date for PT Re-Evaluation 11/25/22    Authorization Type [place] [employer]    Authorization - Visit Number 2    Authorization - Number of Visits 27    PT Start Time 1430    PT Stop Time 1500    PT Time Calculation (min) 30 min    Activity Tolerance Patient tolerated treatment well    Behavior During Therapy [facility] for tasks assessed/performed             Past Medical History:  Diagnosis Date   Adenomyosis 2012   Planning hysterectomy    ADHD (attention deficit hyperactivity disorder)    NO MEDS   AKI (acute kidney injury) (HCC) 06/02/2015   Allergy 1988   Hay fever since birth    Asthma 1988   BV (bacterial vaginosis) 10/07/2018   Helicobacter pylori gastritis 04/15/2021   Lumbar spondylolysis    Pyelonephritis 06/02/2015   Sepsis secondary to UTI (HCC) 06/05/2015   Thyromegaly    H/O   Past Surgical History:  Procedure Laterality Date   ENDOMETRIAL ABLATION  2011   INCISE AND DRAIN ABCESS     surgical incision, wound produced mrsa   TUBAL LIGATION  2010   Patient Active Problem List   Diagnosis Date Noted   Helicobacter pylori gastritis 04/15/2021   ADHD (attention deficit hyperactivity disorder) 09/03/2012   Chronic abdominal pain 05/27/2012   Decreased vision 03/19/2012   Tobacco abuse 03/19/2012   Refusal of blood transfusions as patient is Jehovah's Witness 11/11/2011   Adenomyosis    Asthma     REFERRING DIAG: Cervicalgia R sided neck pain   THERAPY DIAG:  Cervicalgia  Other low back pain  Abnormal posture  Rationale for Evaluation and Treatment Rehabilitation  OBJECTIVE: (objective measures completed  at initial evaluation unless otherwise dated)  ONSET DATE: 09/23/22   SUBJECTIVE:                                                                                                                                                                                                          SUBJECTIVE STATEMENT: Pt reports intermittent low back pain which can occur at a high intensity at  times. After a long time frame of walking this last weekend, pt notes she developed a cramp in her L calf.   Hand dominance: Right   PAIN:  Are you having pain? Yes: NPRS scale: 3/10 Pain location: back bilateral  Pain description: sore, stabbing , tingling  Aggravating factors: standing, sitting Relieving factors: really nothing much  0-10/10 pain    Yes: NPRS scale: 3/10 Pain location: neck  Pain description: tight  Aggravating factors: not hurt  Relieving factors: meds  PERTINENT HISTORY:  Allergies  Chronic low back pain (spondylolisthesis?)    PRECAUTIONS: None   WEIGHT BEARING RESTRICTIONS: No   FALLS:  Has patient fallen in last 6 months? No   LIVING ENVIRONMENT: Lives with: lives with their family kids 85 , 2, 46  Lives in: House/apartment Stairs: Yes: Internal: 14 steps; on right going up and External: 4 steps; on right going up Has following equipment at home: None   OCCUPATION: Not currently working    PLOF: Independent Son helps carrying items up and down the stairs    PATIENT GOALS: I want to be able to do normal daily activities with no problems    NEXT MD VISIT: unknown    OBJECTIVE: (objective measures completed at initial evaluation unless otherwise dated)   DIAGNOSTIC FINDINGS:  None available    PATIENT SURVEYS:  NDI 44% (22/50)   COGNITION: Overall cognitive status: Within functional limits for tasks assessed   SENSATION: WFL Numbness in Rt UE and Rt LE at times    POSTURE: rounded shoulders and forward head; Increased lordosis   PALPATION: Pain with  palpation to bilateral suboccipitals and posterior cervicals.                CERVICAL ROM:    Active ROM A/PROM (deg) eval  Flexion 30  Extension 55  Right lateral flexion 45  Left lateral flexion 35  Right rotation WFL twinge   Left rotation WFL tight on L    (Blank rows = not tested)   UPPER EXTREMITY ROM: WNL    Active ROM Right eval Left eval  Shoulder flexion      Shoulder extension      Shoulder abduction      Shoulder adduction      Shoulder extension      Shoulder internal rotation      Shoulder external rotation      Elbow flexion      Elbow extension      Wrist flexion      Wrist extension      Wrist ulnar deviation      Wrist radial deviation      Wrist pronation      Wrist supination       (Blank rows = not tested)   UPPER EXTREMITY MMT:   MMT Right eval Left eval  Shoulder flexion 4 4  Shoulder extension      Shoulder abduction 4 4  Shoulder adduction      Shoulder extension      Shoulder internal rotation      Shoulder external rotation      Middle trapezius      Lower trapezius      Elbow flexion 4+ 4+  Elbow extension 4+ 4+  Wrist flexion      Wrist extension      Wrist ulnar deviation      Wrist radial deviation      Wrist pronation      Wrist supination  Grip strength       (Blank rows = not tested)   CERVICAL SPECIAL TESTS:  NT   FUNCTIONAL TESTS:  NT on eval   MUSCLE LENGTH: Hamstrings: Right WNLs deg; Left WNLs deg  LUMBAR ROM:   Active  A/PROM  10/23/2022  Flexion Full, pulling low back, lordosis does not fully reverse  Extension Full  Right lateral flexion Full  Left lateral flexion Full  Right rotation Full  Left rotation Full   (Blank rows = not tested)  LE ROM: Grossly WNLs Active  Right 10/23/2022 Left 10/23/2022  Hip flexion    Hip extension    Hip abduction    Hip adduction    Hip internal rotation    Hip external rotation    Knee flexion    Knee extension    Ankle dorsiflexion    Ankle  plantarflexion    Ankle inversion    Ankle eversion     (Blank rows = not tested)  LE MMT: Myotome screen negative MMT Right 10/23/2022 Left 10/23/2022  Hip flexion    Hip extension    Hip abduction    Hip adduction    Hip internal rotation    Hip external rotation    Knee flexion    Knee extension    Ankle dorsiflexion    Ankle plantarflexion    Ankle inversion    Ankle eversion     (Blank rows = not tested)  LUMBAR SPECIAL TESTS:  Straight leg raise test: Negative, Slump test: Negative, SI Compression/distraction test: Negative, and FABER test: Negative  GAIT: Distance walked: 200' Assistive device utilized: None Level of assistance: Complete Independence Comments: WNLs    TODAY'S TREATMENT: [facility] Adult PT Treatment:                                                DATE: 10/21/22 Therapeutic Exercise: Developed, instructed in, and pt completed therex as noted in HEP                                                                                                                               DATE: 10/14/2022   PT, POC, HEP and inclusion of low back in PT Head posture, weight of head/hair and effect on neck pain    PATIENT EDUCATION:  Education details: HEP, POC , core    Person educated: Patient Education method: Explanation, Demonstration, and Handouts Education comprehension: verbalized understanding, returned demonstration, and needs further education   HOME EXERCISE PROGRAM: Access Code: [email or web address] URL: [email or web address] Date: 10/22/2022 Prepared by: Joellyn Rued  Exercises - Supine Cervical Retraction with Towel  - 2 x daily - 7 x weekly - 2 sets - 10 reps - 5 hold - Hooklying Transversus Abdominis Palpation  - 2 x daily - 7 x weekly - 2 sets -  10 reps - 5 hold - Supine Cervical Rotation AROM on Pillow  - 2 x daily - 7 x weekly - 2 sets - 10 reps - 5 hold - Hooklying Single Knee to Chest Stretch  - 2 x daily - 7 x weekly - 1 sets - 3 reps - 20  hold - Supine Double Knee to Chest  - 2 x daily - 7 x weekly - 1 sets - 3 reps - 20 hold - Supine Posterior Pelvic Tilt  - 2 x daily - 7 x weekly - 1 sets - 10 reps - 3 hold - Hooklying Clamshell with Resistance  - 2 x daily - 7 x weekly - 1 sets - 10 reps - 5 hold   ASSESSMENT:   CLINICAL IMPRESSION: Assessed pt's low back with her demonstrating good trunk ROM without reproduction of low back pain. With forward bending, pt's lumbar lordosis did not fully reverse. Pt was started on lumbopelvic flexibility and strengthening therex. Pt tolerated PT today without adverse effects. Pt will benefit from skilled PT to address impairments to optimize low back function with less pain.   OBJECTIVE IMPAIRMENTS: decreased activity tolerance, decreased mobility, difficulty walking, decreased ROM, decreased strength, increased fascial restrictions, increased muscle spasms, impaired UE functional use, postural dysfunction, obesity, and pain.    ACTIVITY LIMITATIONS: carrying, lifting, bending, sitting, standing, squatting, sleeping, stairs, bed mobility, hygiene/grooming, locomotion level, and caring for others   PARTICIPATION LIMITATIONS: interpersonal relationship, community activity, and occupation   PERSONAL FACTORS: Past/current experiences and 1-2 comorbidities: lumbar spondylolysis  are also affecting patient's functional outcome.    REHAB POTENTIAL: Good   CLINICAL DECISION MAKING: Evolving/moderate complexity   EVALUATION COMPLEXITY: Moderate     GOALS: Goals reviewed with patient? No     LONG TERM GOALS: Target date: 11/25/2022     Pt will be I with HEP for cervical and lumbar stabilization, trunk flexibility  Baseline: unknown  Goal status: INITIAL   2.  Pt will be able to sleep > 5 hours per night due to less pain overall  Baseline: 3-5 hrs  Goal status: INITIAL   3.  Pt will be able to demo normal cervical AROM in all planes  Baseline: limited in all planes with pain  Goal  status: INITIAL   4.  Pt will be able to lift items to shoulder hgt with no increase in neck pain  Baseline: painful  Goal status: INITIAL   5. NDI score will improve to 34% to demo improved functional mobility.             Baseline: 44%            Goal status: INITIAL   6. Pt wil report a 50% or greater improvement with low back pain for improved function of her trunk and QOL    Baseline: 0-10/10            Goal status: INITIAL    PLAN:   PT FREQUENCY: 2x/week   PT DURATION: 6 weeks   PLANNED INTERVENTIONS: Therapeutic exercises, Therapeutic activity, Neuromuscular re-education, Patient/Family education, Self Care, Joint mobilization, Dry Needling, Electrical stimulation, Spinal manipulation, Spinal mobilization, Cryotherapy, Moist heat, Manual therapy, and Re-evaluation   PLAN FOR NEXT SESSION: check HEP, eval Lumbar if signed.  , manual if tolerate, posture   Daylyn Christine MS, PT 10/23/22 1:58 PM

## 2022-10-22 ENCOUNTER — Ambulatory Visit: Payer: Medicaid Other

## 2022-10-22 DIAGNOSIS — M5459 Other low back pain: Secondary | ICD-10-CM

## 2022-10-22 DIAGNOSIS — R293 Abnormal posture: Secondary | ICD-10-CM

## 2022-10-22 DIAGNOSIS — M542 Cervicalgia: Secondary | ICD-10-CM

## 2022-10-29 ENCOUNTER — Encounter: Payer: Self-pay | Admitting: Physical Therapy

## 2022-10-29 ENCOUNTER — Ambulatory Visit: Payer: Medicaid Other | Admitting: Physical Therapy

## 2022-10-29 DIAGNOSIS — M542 Cervicalgia: Secondary | ICD-10-CM

## 2022-10-29 DIAGNOSIS — R293 Abnormal posture: Secondary | ICD-10-CM

## 2022-10-29 DIAGNOSIS — M5459 Other low back pain: Secondary | ICD-10-CM

## 2022-10-29 NOTE — Therapy (Signed)
OUTPATIENT PHYSICAL THERAPY TREATMENT NOTE   Patient Name: [name] MRN: [id number] DOB:[birth year], 37 y.o., female Today's Date: 10/29/2022  PCP: PCP: Jerre Simon MD   REFERRING PROVIDER: REFERRING PROVIDER: Margart Sickles   END OF SESSION:   PT End of Session - 10/29/22 1553     Visit Number 3    Number of Visits 12    Date for PT Re-Evaluation 11/25/22    Authorization Type [place] [employer]    Authorization - Visit Number 3    Authorization - Number of Visits 27    PT Start Time 1550    PT Stop Time 1615    PT Time Calculation (min) 25 min    Activity Tolerance Patient tolerated treatment well    Behavior During Therapy [facility] for tasks assessed/performed              Past Medical History:  Diagnosis Date   Adenomyosis 2012   Planning hysterectomy    ADHD (attention deficit hyperactivity disorder)    NO MEDS   AKI (acute kidney injury) (HCC) 06/02/2015   Allergy 1988   Hay fever since birth    Asthma 1988   BV (bacterial vaginosis) 10/07/2018   Helicobacter pylori gastritis 04/15/2021   Lumbar spondylolysis    Pyelonephritis 06/02/2015   Sepsis secondary to UTI (HCC) 06/05/2015   Thyromegaly    H/O   Past Surgical History:  Procedure Laterality Date   ENDOMETRIAL ABLATION  2011   INCISE AND DRAIN ABCESS     surgical incision, wound produced mrsa   TUBAL LIGATION  2010   Patient Active Problem List   Diagnosis Date Noted   Helicobacter pylori gastritis 04/15/2021   ADHD (attention deficit hyperactivity disorder) 09/03/2012   Chronic abdominal pain 05/27/2012   Decreased vision 03/19/2012   Tobacco abuse 03/19/2012   Refusal of blood transfusions as patient is Jehovah's Witness 11/11/2011   Adenomyosis    Asthma     REFERRING DIAG: Cervicalgia R sided neck pain   THERAPY DIAG:  Cervicalgia  Abnormal posture  Other low back pain  Rationale for Evaluation and Treatment Rehabilitation  OBJECTIVE: (objective measures completed at initial  evaluation unless otherwise dated)  ONSET DATE: 09/23/22   SUBJECTIVE:                                                                                                                                                                                                          SUBJECTIVE STATEMENT: She has an event at school tonight. Shortened session. No pain today.  Had  some neck pain the other day, I think its stress.     Hand dominance: Right   PAIN:  Are you having pain? Yes: NPRS scale: 0/10 Pain location: back bilateral  Pain description: sore, stabbing , tingling  Aggravating factors: standing, sitting Relieving factors: really nothing much  0-10/10 pain    Yes: NPRS scale: )/10 Pain location: neck  Pain description: tight  Aggravating factors: not hurt  Relieving factors: meds  PERTINENT HISTORY:  Allergies  Chronic low back pain (spondylolisthesis?)    PRECAUTIONS: None   WEIGHT BEARING RESTRICTIONS: No   FALLS:  Has patient fallen in last 6 months? No   LIVING ENVIRONMENT: Lives with: lives with their family kids 29 , 94, 22  Lives in: House/apartment Stairs: Yes: Internal: 14 steps; on right going up and External: 4 steps; on right going up Has following equipment at home: None   OCCUPATION: Not currently working    PLOF: Independent Son helps carrying items up and down the stairs    PATIENT GOALS: I want to be able to do normal daily activities with no problems    NEXT MD VISIT: unknown    OBJECTIVE: (objective measures completed at initial evaluation unless otherwise dated)   DIAGNOSTIC FINDINGS:  None available    PATIENT SURVEYS:  NDI 44% (22/50)   COGNITION: Overall cognitive status: Within functional limits for tasks assessed   SENSATION: WFL Numbness in Rt UE and Rt LE at times    POSTURE: rounded shoulders and forward head; Increased lordosis   PALPATION: Pain with palpation to bilateral suboccipitals and posterior cervicals.                 CERVICAL ROM:    Active ROM A/PROM (deg) eval  Flexion 30  Extension 55  Right lateral flexion 45  Left lateral flexion 35  Right rotation WFL twinge   Left rotation WFL tight on L    (Blank rows = not tested)   UPPER EXTREMITY ROM: WNL    Active ROM Right eval Left eval  Shoulder flexion      Shoulder extension      Shoulder abduction      Shoulder adduction      Shoulder extension      Shoulder internal rotation      Shoulder external rotation      Elbow flexion      Elbow extension      Wrist flexion      Wrist extension      Wrist ulnar deviation      Wrist radial deviation      Wrist pronation      Wrist supination       (Blank rows = not tested)   UPPER EXTREMITY MMT:   MMT Right eval Left eval  Shoulder flexion 4 4  Shoulder extension      Shoulder abduction 4 4  Shoulder adduction      Shoulder extension      Shoulder internal rotation      Shoulder external rotation      Middle trapezius      Lower trapezius      Elbow flexion 4+ 4+  Elbow extension 4+ 4+  Wrist flexion      Wrist extension      Wrist ulnar deviation      Wrist radial deviation      Wrist pronation      Wrist supination      Grip strength       (  Blank rows = not tested)   CERVICAL SPECIAL TESTS:  NT   FUNCTIONAL TESTS:  NT on eval   MUSCLE LENGTH: Hamstrings: Right WNLs deg; Left WNLs deg  LUMBAR ROM:   Active  A/PROM  10/29/2022  Flexion Full, pulling low back, lordosis does not fully reverse  Extension Full  Right lateral flexion Full  Left lateral flexion Full  Right rotation Full  Left rotation Full   (Blank rows = not tested)  LE ROM: Grossly WNLs Active  Right 10/29/2022 Left 10/29/2022  Hip flexion    Hip extension    Hip abduction    Hip adduction    Hip internal rotation    Hip external rotation    Knee flexion    Knee extension    Ankle dorsiflexion    Ankle plantarflexion    Ankle inversion    Ankle eversion     (Blank rows = not  tested)  LE MMT: Myotome screen negative MMT Right 10/29/2022 Left 10/29/2022  Hip flexion    Hip extension    Hip abduction    Hip adduction    Hip internal rotation    Hip external rotation    Knee flexion    Knee extension    Ankle dorsiflexion    Ankle plantarflexion    Ankle inversion    Ankle eversion     (Blank rows = not tested)  LUMBAR SPECIAL TESTS:  Straight leg raise test: Negative, Slump test: Negative, SI Compression/distraction test: Negative, and FABER test: Negative  GAIT: Distance walked: 200' Assistive device utilized: None Level of assistance: Complete Independence Comments: WNLs    TODAY'S TREATMENT:  [facility] Adult PT Treatment:                                                DATE: 10/29/22 Therapeutic Exercise: Supine anterior/posterior pelvic tilt with and without the ball  Knee to chest single and double knee  Stabilization: march, bent knee fall out, SLR  Single leg clam blue band , double leg cues needed  90/90 supine legs hold x 30 sec  LTR x 10 each side  Hamstring 30 sec x 2      [facility] Adult PT Treatment:                                                DATE: 10/21/22 Therapeutic Exercise: Developed, instructed in, and pt completed therex as noted in HEP                                                                                                                               DATE: 10/14/2022   PT, POC, HEP and  inclusion of low back in PT Head posture, weight of head/hair and effect on neck pain    PATIENT EDUCATION:  Education details: HEP, POC , core    Person educated: Patient Education method: Explanation, Demonstration, and Handouts Education comprehension: verbalized understanding, returned demonstration, and needs further education   HOME EXERCISE PROGRAM: Access Code: [email or web address] URL: [email or web address] Date: 10/22/2022 Prepared by: Joellyn Rued  Exercises - Supine Cervical Retraction with Towel  - 2 x daily - 7  x weekly - 2 sets - 10 reps - 5 hold - Hooklying Transversus Abdominis Palpation  - 2 x daily - 7 x weekly - 2 sets - 10 reps - 5 hold - Supine Cervical Rotation AROM on Pillow  - 2 x daily - 7 x weekly - 2 sets - 10 reps - 5 hold - Hooklying Single Knee to Chest Stretch  - 2 x daily - 7 x weekly - 1 sets - 3 reps - 20 hold - Supine Double Knee to Chest  - 2 x daily - 7 x weekly - 1 sets - 3 reps - 20 hold - Supine Posterior Pelvic Tilt  - 2 x daily - 7 x weekly - 1 sets - 10 reps - 3 hold - Hooklying Clamshell with Resistance  - 2 x daily - 7 x weekly - 1 sets - 10 reps - 5 hold   ASSESSMENT:   CLINICAL IMPRESSION: Pt has a shorter session due to an event at school.  She is feeling much better overall in her back and neck.  She will continue to benefit from skilled PT to improve core strength and stability. She needed cues for technique but overall showing improved understanding of concepts.     OBJECTIVE IMPAIRMENTS: decreased activity tolerance, decreased mobility, difficulty walking, decreased ROM, decreased strength, increased fascial restrictions, increased muscle spasms, impaired UE functional use, postural dysfunction, obesity, and pain.    ACTIVITY LIMITATIONS: carrying, lifting, bending, sitting, standing, squatting, sleeping, stairs, bed mobility, hygiene/grooming, locomotion level, and caring for others   PARTICIPATION LIMITATIONS: interpersonal relationship, community activity, and occupation   PERSONAL FACTORS: Past/current experiences and 1-2 comorbidities: lumbar spondylolysis  are also affecting patient's functional outcome.    REHAB POTENTIAL: Good   CLINICAL DECISION MAKING: Evolving/moderate complexity   EVALUATION COMPLEXITY: Moderate     GOALS: Goals reviewed with patient? No     LONG TERM GOALS: Target date: 11/25/2022     Pt will be I with HEP for cervical and lumbar stabilization, trunk flexibility  Baseline: unknown  Goal status: INITIAL   2.  Pt will  be able to sleep > 5 hours per night due to less pain overall  Baseline: 3-5 hrs  Goal status: INITIAL   3.  Pt will be able to demo normal cervical AROM in all planes  Baseline: limited in all planes with pain  Goal status: INITIAL   4.  Pt will be able to lift items to shoulder hgt with no increase in neck pain  Baseline: painful  Goal status: INITIAL   5. NDI score will improve to 34% to demo improved functional mobility.             Baseline: 44%            Goal status: INITIAL   6. Pt wil report a 50% or greater improvement with low back pain for improved function of her trunk and QOL    Baseline: 0-10/10  Goal status: INITIAL    PLAN:   PT FREQUENCY: 2x/week   PT DURATION: 6 weeks   PLANNED INTERVENTIONS: Therapeutic exercises, Therapeutic activity, Neuromuscular re-education, Patient/Family education, Self Care, Joint mobilization, Dry Needling, Electrical stimulation, Spinal manipulation, Spinal mobilization, Cryotherapy, Moist heat, Manual therapy, and Re-evaluation   PLAN FOR NEXT SESSION: check HEP, eval Lumbar if signed.  , manual if tolerate, posture   Karie Mainland, PT 10/29/22 4:24 PM Phone: [phone] Fax: [phone]

## 2022-10-31 ENCOUNTER — Ambulatory Visit: Payer: Medicaid Other | Admitting: Physical Therapy

## 2022-10-31 ENCOUNTER — Encounter: Payer: Self-pay | Admitting: Physical Therapy

## 2022-10-31 DIAGNOSIS — M5459 Other low back pain: Secondary | ICD-10-CM

## 2022-10-31 DIAGNOSIS — R293 Abnormal posture: Secondary | ICD-10-CM

## 2022-10-31 DIAGNOSIS — M542 Cervicalgia: Secondary | ICD-10-CM

## 2022-10-31 NOTE — Therapy (Signed)
OUTPATIENT PHYSICAL THERAPY TREATMENT NOTE   Patient Name: [name] MRN: [id number] DOB:02-12-1986, 37 y.o., female Today's Date: 10/31/2022  PCP: PCP: Jerre Simon MD   REFERRING PROVIDER: REFERRING PROVIDER: Margart Sickles   END OF SESSION:   PT End of Session - 10/31/22 1142     Visit Number 4    Number of Visits 12    Date for PT Re-Evaluation 11/25/22    Authorization Type [place] [facility]    Authorization - Visit Number 4    Authorization - Number of Visits 27    PT Start Time 1145    PT Stop Time 1228    PT Time Calculation (min) 43 min              Past Medical History:  Diagnosis Date   Adenomyosis 2012   Planning hysterectomy    ADHD (attention deficit hyperactivity disorder)    NO MEDS   AKI (acute kidney injury) (HCC) 06/02/2015   Allergy 1988   Hay fever since birth    Asthma 1988   BV (bacterial vaginosis) 10/07/2018   Helicobacter pylori gastritis 04/15/2021   Lumbar spondylolysis    Pyelonephritis 06/02/2015   Sepsis secondary to UTI (HCC) 06/05/2015   Thyromegaly    H/O   Past Surgical History:  Procedure Laterality Date   ENDOMETRIAL ABLATION  2011   INCISE AND DRAIN ABCESS     surgical incision, wound produced mrsa   TUBAL LIGATION  2010   Patient Active Problem List   Diagnosis Date Noted   Helicobacter pylori gastritis 04/15/2021   ADHD (attention deficit hyperactivity disorder) 09/03/2012   Chronic abdominal pain 05/27/2012   Decreased vision 03/19/2012   Tobacco abuse 03/19/2012   Refusal of blood transfusions as patient is Jehovah's Witness 11/11/2011   Adenomyosis    Asthma     REFERRING DIAG: Cervicalgia R sided neck pain   THERAPY DIAG:  Cervicalgia  Abnormal posture  Other low back pain  Rationale for Evaluation and Treatment Rehabilitation  OBJECTIVE: (objective measures completed at initial evaluation unless otherwise dated)  ONSET DATE: 09/23/22   SUBJECTIVE:                                                                                                                                                                                                           SUBJECTIVE STATEMENT: I think I over did it playing with the kids and now I feel a little sore in my upper back.    Hand dominance: Right   PAIN:  Are you  having pain? Yes: NPRS scale: 0/10 Pain location: back bilateral  Pain description: sore, stabbing , tingling  Aggravating factors: standing, sitting Relieving factors: really nothing much  0-10/10 pain    Yes: NPRS scale: )/10 Pain location: neck  Pain description: tight  Aggravating factors: not hurt  Relieving factors: meds  PERTINENT HISTORY:  Allergies  Chronic low back pain (spondylolisthesis?)    PRECAUTIONS: None   WEIGHT BEARING RESTRICTIONS: No   FALLS:  Has patient fallen in last 6 months? No   LIVING ENVIRONMENT: Lives with: lives with their family kids 83 , 79, 68  Lives in: House/apartment Stairs: Yes: Internal: 14 steps; on right going up and External: 4 steps; on right going up Has following equipment at home: None   OCCUPATION: Not currently working    PLOF: Independent Son helps carrying items up and down the stairs    PATIENT GOALS: I want to be able to do normal daily activities with no problems    NEXT MD VISIT: unknown    OBJECTIVE: (objective measures completed at initial evaluation unless otherwise dated)   DIAGNOSTIC FINDINGS:  None available    PATIENT SURVEYS:  NDI 44% (22/50)   COGNITION: Overall cognitive status: Within functional limits for tasks assessed   SENSATION: WFL Numbness in Rt UE and Rt LE at times    POSTURE: rounded shoulders and forward head; Increased lordosis   PALPATION: Pain with palpation to bilateral suboccipitals and posterior cervicals.                CERVICAL ROM:    Active ROM A/PROM (deg) eval  Flexion 30  Extension 55  Right lateral flexion 45  Left lateral flexion 35  Right rotation  WFL twinge   Left rotation WFL tight on L    (Blank rows = not tested)   UPPER EXTREMITY ROM: WNL    Active ROM Right eval Left eval  Shoulder flexion      Shoulder extension      Shoulder abduction      Shoulder adduction      Shoulder extension      Shoulder internal rotation      Shoulder external rotation      Elbow flexion      Elbow extension      Wrist flexion      Wrist extension      Wrist ulnar deviation      Wrist radial deviation      Wrist pronation      Wrist supination       (Blank rows = not tested)   UPPER EXTREMITY MMT:   MMT Right eval Left eval  Shoulder flexion 4 4  Shoulder extension      Shoulder abduction 4 4  Shoulder adduction      Shoulder extension      Shoulder internal rotation      Shoulder external rotation      Middle trapezius      Lower trapezius      Elbow flexion 4+ 4+  Elbow extension 4+ 4+  Wrist flexion      Wrist extension      Wrist ulnar deviation      Wrist radial deviation      Wrist pronation      Wrist supination      Grip strength       (Blank rows = not tested)   CERVICAL SPECIAL TESTS:  NT   FUNCTIONAL TESTS:  NT on  eval   MUSCLE LENGTH: Hamstrings: Right WNLs deg; Left WNLs deg  LUMBAR ROM:   Active  A/PROM  10/31/2022  Flexion Full, pulling low back, lordosis does not fully reverse  Extension Full  Right lateral flexion Full  Left lateral flexion Full  Right rotation Full  Left rotation Full   (Blank rows = not tested)  LE ROM: Grossly WNLs Active  Right 10/31/2022 Left 10/31/2022  Hip flexion    Hip extension    Hip abduction    Hip adduction    Hip internal rotation    Hip external rotation    Knee flexion    Knee extension    Ankle dorsiflexion    Ankle plantarflexion    Ankle inversion    Ankle eversion     (Blank rows = not tested)  LE MMT: Myotome screen negative MMT Right 10/31/2022 Left 10/31/2022  Hip flexion    Hip extension    Hip abduction    Hip adduction     Hip internal rotation    Hip external rotation    Knee flexion    Knee extension    Ankle dorsiflexion    Ankle plantarflexion    Ankle inversion    Ankle eversion     (Blank rows = not tested)  LUMBAR SPECIAL TESTS:  Straight leg raise test: Negative, Slump test: Negative, SI Compression/distraction test: Negative, and FABER test: Negative  GAIT: Distance walked: 200' Assistive device utilized: None Level of assistance: Complete Independence Comments: WNLs    TODAY'S TREATMENT: [facility] Adult PT Treatment:                                                DATE: 10/31/22 Therapeutic Exercise: Nustep L5 Ue/LE x 5 minutes Tra neutral spine via pelvic tilting: March, Bent knee fall out (with and without Blue band) 90/90 ab brace 5 sec x 5  Chin tuck  Supine horiz abdct x 10  , Green  PPT to bridge x 8- discomfort in back  SKTC  LTR Hamstring stretch supine with strap  Prone quad stretch with strap      [facility] Adult PT Treatment:                                                DATE: 10/29/22 Therapeutic Exercise: Supine anterior/posterior pelvic tilt with and without the ball  Knee to chest single and double knee  Stabilization: march, bent knee fall out, SLR  Single leg clam blue band , double leg cues needed  90/90 supine legs hold x 30 sec  LTR x 10 each side  Hamstring 30 sec x 2      [facility] Adult PT Treatment:                                                DATE: 10/21/22 Therapeutic Exercise: Developed, instructed in, and pt completed therex as noted in HEP  DATE: 10/14/2022   PT, POC, HEP and inclusion of low back in PT Head posture, weight of head/hair and effect on neck pain    PATIENT EDUCATION:  Education details: HEP, POC , core    Person educated: Patient Education method: Explanation, Demonstration, and Handouts Education comprehension:  verbalized understanding, returned demonstration, and needs further education   HOME EXERCISE PROGRAM: Access Code: [email or web address] URL: [email or web address] Date: 10/22/2022 Prepared by: Joellyn Rued  Exercises - Supine Cervical Retraction with Towel  - 2 x daily - 7 x weekly - 2 sets - 10 reps - 5 hold - Hooklying Transversus Abdominis Palpation  - 2 x daily - 7 x weekly - 2 sets - 10 reps - 5 hold - Supine Cervical Rotation AROM on Pillow  - 2 x daily - 7 x weekly - 2 sets - 10 reps - 5 hold - Hooklying Single Knee to Chest Stretch  - 2 x daily - 7 x weekly - 1 sets - 3 reps - 20 hold - Supine Double Knee to Chest  - 2 x daily - 7 x weekly - 1 sets - 3 reps - 20 hold - Supine Posterior Pelvic Tilt  - 2 x daily - 7 x weekly - 1 sets - 10 reps - 3 hold - Hooklying Clamshell with Resistance  - 2 x daily - 7 x weekly - 1 sets - 10 reps - 5 hold   ASSESSMENT:   CLINICAL IMPRESSION: Pt continues to endorse improvement  in neck and back. She was able to play outside with the kids and do more tham usual. She does have some soreness across upper back as a result but not pain. Continued with Lumbar and cervical stabilization. She felt some discomfort in back with bridge so discontinued. Added quad stretches with increased tightness on right compared to left. She will continue to benefit from skilled PT to improve core strength and stability.    OBJECTIVE IMPAIRMENTS: decreased activity tolerance, decreased mobility, difficulty walking, decreased ROM, decreased strength, increased fascial restrictions, increased muscle spasms, impaired UE functional use, postural dysfunction, obesity, and pain.    ACTIVITY LIMITATIONS: carrying, lifting, bending, sitting, standing, squatting, sleeping, stairs, bed mobility, hygiene/grooming, locomotion level, and caring for others   PARTICIPATION LIMITATIONS: interpersonal relationship, community activity, and occupation   PERSONAL FACTORS: Past/current  experiences and 1-2 comorbidities: lumbar spondylolysis  are also affecting patient's functional outcome.    REHAB POTENTIAL: Good   CLINICAL DECISION MAKING: Evolving/moderate complexity   EVALUATION COMPLEXITY: Moderate     GOALS: Goals reviewed with patient? No     LONG TERM GOALS: Target date: 11/25/2022     Pt will be I with HEP for cervical and lumbar stabilization, trunk flexibility  Baseline: unknown  Goal status: INITIAL   2.  Pt will be able to sleep > 5 hours per night due to less pain overall  Baseline: 3-5 hrs  Goal status: INITIAL   3.  Pt will be able to demo normal cervical AROM in all planes  Baseline: limited in all planes with pain  Goal status: INITIAL   4.  Pt will be able to lift items to shoulder hgt with no increase in neck pain  Baseline: painful  Goal status: INITIAL   5. NDI score will improve to 34% to demo improved functional mobility.             Baseline: 44%            Goal status: INITIAL  6. Pt wil report a 50% or greater improvement with low back pain for improved function of her trunk and QOL    Baseline: 0-10/10\            Goal status: INITIAL    PLAN:   PT FREQUENCY: 2x/week   PT DURATION: 6 weeks   PLANNED INTERVENTIONS: Therapeutic exercises, Therapeutic activity, Neuromuscular re-education, Patient/Family education, Self Care, Joint mobilization, Dry Needling, Electrical stimulation, Spinal manipulation, Spinal mobilization, Cryotherapy, Moist heat, Manual therapy, and Re-evaluation   PLAN FOR NEXT SESSION: check HEP,  , manual if tolerate, posture , consider quadruped progression  Jannette Spanner, PTA 10/31/22 12:25 PM Phone: [phone] Fax: [phone]

## 2022-11-05 ENCOUNTER — Ambulatory Visit: Payer: Medicaid Other

## 2022-11-05 DIAGNOSIS — M542 Cervicalgia: Secondary | ICD-10-CM

## 2022-11-05 DIAGNOSIS — R293 Abnormal posture: Secondary | ICD-10-CM

## 2022-11-05 DIAGNOSIS — M5459 Other low back pain: Secondary | ICD-10-CM

## 2022-11-05 NOTE — Therapy (Signed)
OUTPATIENT PHYSICAL THERAPY TREATMENT NOTE   Patient Name: [name] MRN: [id number] DOB:[birth year], 37 y.o., female Today's Date: 11/05/2022  PCP: PCP: Jerre Simon MD   REFERRING PROVIDER: REFERRING PROVIDER: Margart Sickles   END OF SESSION:   PT End of Session - 11/05/22 1640     Visit Number 5    Number of Visits 12    Date for PT Re-Evaluation 11/25/22    Authorization Type  [employer]    Authorization - Visit Number 5    Authorization - Number of Visits 27    PT Start Time 1630    PT Stop Time 1714    PT Time Calculation (min) 44 min    Activity Tolerance Patient tolerated treatment well    Behavior During Therapy [facility] for tasks assessed/performed              Past Medical History:  Diagnosis Date   Adenomyosis 2012   Planning hysterectomy    ADHD (attention deficit hyperactivity disorder)    NO MEDS   AKI (acute kidney injury) (HCC) 06/02/2015   Allergy 1988   Hay fever since birth    Asthma 1988   BV (bacterial vaginosis) 10/07/2018   Helicobacter pylori gastritis 04/15/2021   Lumbar spondylolysis    Pyelonephritis 06/02/2015   Sepsis secondary to UTI (HCC) 06/05/2015   Thyromegaly    H/O   Past Surgical History:  Procedure Laterality Date   ENDOMETRIAL ABLATION  2011   INCISE AND DRAIN ABCESS     surgical incision, wound produced mrsa   TUBAL LIGATION  2010   Patient Active Problem List   Diagnosis Date Noted   Helicobacter pylori gastritis 04/15/2021   ADHD (attention deficit hyperactivity disorder) 09/03/2012   Chronic abdominal pain 05/27/2012   Decreased vision 03/19/2012   Tobacco abuse 03/19/2012   Refusal of blood transfusions as patient is Jehovah's Witness 11/11/2011   Adenomyosis    Asthma     REFERRING DIAG: Cervicalgia R sided neck pain   THERAPY DIAG:  Cervicalgia  Abnormal posture  Other low back pain  Rationale for Evaluation and Treatment Rehabilitation  OBJECTIVE: (objective measures completed at initial  evaluation unless otherwise dated)  ONSET DATE: 09/23/22   SUBJECTIVE:                                                                                                                                                                                                          SUBJECTIVE STATEMENT: The exercises are helping a lot. I have a little stiffness in my mid-low  back today. Pt reports her neck has not been bothering her.   Hand dominance: Right   PAIN:  Are you having pain? Yes: NPRS scale: 1-2/10 Pain location: back bilateral  Pain description: sore, stabbing , tingling  Aggravating factors: standing, sitting Relieving factors: really nothing much  0-10/10 pain    Yes: NPRS scale: 0/10 Pain location: neck  Pain description: tight  Aggravating factors: not hurt  Relieving factors: meds  PERTINENT HISTORY:  Allergies  Chronic low back pain (spondylolisthesis?)    PRECAUTIONS: None   WEIGHT BEARING RESTRICTIONS: No   FALLS:  Has patient fallen in last 6 months? No   LIVING ENVIRONMENT: Lives with: lives with their family kids 68 , 21, 62  Lives in: House/apartment Stairs: Yes: Internal: 14 steps; on right going up and External: 4 steps; on right going up Has following equipment at home: None   OCCUPATION: Not currently working    PLOF: Independent Son helps carrying items up and down the stairs    PATIENT GOALS: I want to be able to do normal daily activities with no problems    NEXT MD VISIT: unknown    OBJECTIVE: (objective measures completed at initial evaluation unless otherwise dated)   DIAGNOSTIC FINDINGS:  None available    PATIENT SURVEYS:  NDI 44% (22/50)   COGNITION: Overall cognitive status: Within functional limits for tasks assessed   SENSATION: WFL Numbness in Rt UE and Rt LE at times    POSTURE: rounded shoulders and forward head; Increased lordosis   PALPATION: Pain with palpation to bilateral suboccipitals and posterior cervicals.                 CERVICAL ROM:    Active ROM A/PROM (deg) eval  Flexion 30  Extension 55  Right lateral flexion 45  Left lateral flexion 35  Right rotation WFL twinge   Left rotation WFL tight on L    (Blank rows = not tested)   UPPER EXTREMITY ROM: WNL    Active ROM Right eval Left eval  Shoulder flexion      Shoulder extension      Shoulder abduction      Shoulder adduction      Shoulder extension      Shoulder internal rotation      Shoulder external rotation      Elbow flexion      Elbow extension      Wrist flexion      Wrist extension      Wrist ulnar deviation      Wrist radial deviation      Wrist pronation      Wrist supination       (Blank rows = not tested)   UPPER EXTREMITY MMT:   MMT Right eval Left eval  Shoulder flexion 4 4  Shoulder extension      Shoulder abduction 4 4  Shoulder adduction      Shoulder extension      Shoulder internal rotation      Shoulder external rotation      Middle trapezius      Lower trapezius      Elbow flexion 4+ 4+  Elbow extension 4+ 4+  Wrist flexion      Wrist extension      Wrist ulnar deviation      Wrist radial deviation      Wrist pronation      Wrist supination      Grip strength       (  Blank rows = not tested)   CERVICAL SPECIAL TESTS:  NT   FUNCTIONAL TESTS:  NT on eval   MUSCLE LENGTH: Hamstrings: Right WNLs deg; Left WNLs deg  LUMBAR ROM:   Active  A/PROM  11/05/2022  Flexion Full, pulling low back, lordosis does not fully reverse  Extension Full  Right lateral flexion Full  Left lateral flexion Full  Right rotation Full  Left rotation Full   (Blank rows = not tested)  LE ROM: Grossly WNLs Active  Right 11/05/2022 Left 11/05/2022  Hip flexion    Hip extension    Hip abduction    Hip adduction    Hip internal rotation    Hip external rotation    Knee flexion    Knee extension    Ankle dorsiflexion    Ankle plantarflexion    Ankle inversion    Ankle eversion     (Blank rows  = not tested)  LE MMT: Myotome screen negative MMT Right 11/05/2022 Left 11/05/2022  Hip flexion    Hip extension    Hip abduction    Hip adduction    Hip internal rotation    Hip external rotation    Knee flexion    Knee extension    Ankle dorsiflexion    Ankle plantarflexion    Ankle inversion    Ankle eversion     (Blank rows = not tested)  LUMBAR SPECIAL TESTS:  Straight leg raise test: Negative, Slump test: Negative, SI Compression/distraction test: Negative, and FABER test: Negative  GAIT: Distance walked: 200' Assistive device utilized: None Level of assistance: Complete Independence Comments: WNLs    TODAY'S TREATMENT: [facility] Adult PT Treatment:                                                DATE: 11/05/22 Therapeutic Exercise: Nustep L5 UR/LE x 6 minutes Seated forward flexion forward and laterally c swiss ball Tra neutral spine via pelvic tilting: March, Bent knee fall out (with and without Blue band) 90/90 ab brace 5 sec x 5  Bridge x10 SKTC  LTR Q'ped hip extension 2x5 each  [facility] Adult PT Treatment:                                                DATE: 10/31/22 Therapeutic Exercise: Nustep L5 Ue/LE x 5 minutes Tra neutral spine via pelvic tilting: March, Bent knee fall out (with and without Blue band) 90/90 ab brace 5 sec x 5  Chin tuck  Supine horiz abdct x 10  , Green  PPT to bridge x 8- discomfort in back  SKTC  LTR Hamstring stretch supine with strap  Prone quad stretch with strap   [facility] Adult PT Treatment:                                                DATE: 10/29/22 Therapeutic Exercise: Supine anterior/posterior pelvic tilt with and without the ball  Knee to chest single and double knee  Stabilization: march, bent knee fall out, SLR  Single leg clam blue band , double leg cues  needed  90/90 supine legs hold x 30 sec  LTR x 10 each side  Hamstring 30 sec x 2    [facility] Adult PT Treatment:                                                DATE:  10/21/22 Therapeutic Exercise: Developed, instructed in, and pt completed therex as noted in HEP                                                                                                                               DATE: 10/14/2022   PT, POC, HEP and inclusion of low back in PT Head posture, weight of head/hair and effect on neck pain    PATIENT EDUCATION:  Education details: HEP, POC , core    Person educated: Patient Education method: Explanation, Demonstration, and Handouts Education comprehension: verbalized understanding, returned demonstration, and needs further education   HOME EXERCISE PROGRAM: Access Code: [email or web address] URL: [email or web address] Date: 10/22/2022 Prepared by: Joellyn Rued  Exercises - Supine Cervical Retraction with Towel  - 2 x daily - 7 x weekly - 2 sets - 10 reps - 5 hold - Hooklying Transversus Abdominis Palpation  - 2 x daily - 7 x weekly - 2 sets - 10 reps - 5 hold - Supine Cervical Rotation AROM on Pillow  - 2 x daily - 7 x weekly - 2 sets - 10 reps - 5 hold - Hooklying Single Knee to Chest Stretch  - 2 x daily - 7 x weekly - 1 sets - 3 reps - 20 hold - Supine Double Knee to Chest  - 2 x daily - 7 x weekly - 1 sets - 3 reps - 20 hold - Supine Posterior Pelvic Tilt  - 2 x daily - 7 x weekly - 1 sets - 10 reps - 3 hold - Hooklying Clamshell with Resistance  - 2 x daily - 7 x weekly - 1 sets - 10 reps - 5 hold   ASSESSMENT:   CLINICAL IMPRESSION: PT was completed for lumbopelvic flexibility and strengthening. Focus of PT continues with abdominal strengthening, while strengthening was also completed for the back and gluteals. After the PT session pt reported her mid-low back stiffness/discomfort was resolved. Pt will continue to benefit from skilled PT to improve core strength and stability.  OBJECTIVE IMPAIRMENTS: decreased activity tolerance, decreased mobility, difficulty walking, decreased ROM, decreased strength, increased fascial  restrictions, increased muscle spasms, impaired UE functional use, postural dysfunction, obesity, and pain.    ACTIVITY LIMITATIONS: carrying, lifting, bending, sitting, standing, squatting, sleeping, stairs, bed mobility, hygiene/grooming, locomotion level, and caring for others   PARTICIPATION LIMITATIONS: interpersonal relationship, community activity, and occupation   PERSONAL FACTORS: Past/current experiences and 1-2 comorbidities: lumbar spondylolysis  are also affecting patient's functional outcome.  REHAB POTENTIAL: Good   CLINICAL DECISION MAKING: Evolving/moderate complexity   EVALUATION COMPLEXITY: Moderate     GOALS: Goals reviewed with patient? No     LONG TERM GOALS: Target date: 11/25/2022     Pt will be I with HEP for cervical and lumbar stabilization, trunk flexibility  Baseline: unknown  Goal status: INITIAL   2.  Pt will be able to sleep > 5 hours per night due to less pain overall  Baseline: 3-5 hrs  Goal status: INITIAL   3.  Pt will be able to demo normal cervical AROM in all planes  Baseline: limited in all planes with pain  Goal status: INITIAL   4.  Pt will be able to lift items to shoulder hgt with no increase in neck pain  Baseline: painful  Goal status: INITIAL   5. NDI score will improve to 34% to demo improved functional mobility.             Baseline: 44%            Goal status: INITIAL   6. Pt wil report a 50% or greater improvement with low back pain for improved function of her trunk and QOL    Baseline: 0-10/10\            Goal status: INITIAL    PLAN:   PT FREQUENCY: 2x/week   PT DURATION: 6 weeks   PLANNED INTERVENTIONS: Therapeutic exercises, Therapeutic activity, Neuromuscular re-education, Patient/Family education, Self Care, Joint mobilization, Dry Needling, Electrical stimulation, Spinal manipulation, Spinal mobilization, Cryotherapy, Moist heat, Manual therapy, and Re-evaluation   PLAN FOR NEXT SESSION: check HEP,  ,  manual if tolerate, posture , consider quadruped progression  [employer] MS, PT 11/05/22 5:18 PM

## 2022-11-06 DIAGNOSIS — M542 Cervicalgia: Secondary | ICD-10-CM | POA: Diagnosis not present

## 2022-11-06 NOTE — Therapy (Signed)
OUTPATIENT PHYSICAL THERAPY TREATMENT NOTE   Patient Name: [name] MRN: [id number] DOB:[birth year], 37 y.o., female Today's Date: 11/07/2022  PCP: PCP: Jerre Simon MD   REFERRING PROVIDER: REFERRING PROVIDER: Margart Sickles   END OF SESSION:   PT End of Session - 11/07/22 1149     Visit Number 6    Number of Visits 12    Date for PT Re-Evaluation 11/25/22    Authorization Type [place] [employer]    Authorization - Visit Number 6    Authorization - Number of Visits 27    PT Start Time 1147    PT Stop Time 1230    PT Time Calculation (min) 43 min    Activity Tolerance Patient tolerated treatment well    Behavior During Therapy [facility] for tasks assessed/performed               Past Medical History:  Diagnosis Date   Adenomyosis 2012   Planning hysterectomy    ADHD (attention deficit hyperactivity disorder)    NO MEDS   AKI (acute kidney injury) (HCC) 06/02/2015   Allergy 1988   Hay fever since birth    Asthma 1988   BV (bacterial vaginosis) 10/07/2018   Helicobacter pylori gastritis 04/15/2021   Lumbar spondylolysis    Pyelonephritis 06/02/2015   Sepsis secondary to UTI (HCC) 06/05/2015   Thyromegaly    H/O   Past Surgical History:  Procedure Laterality Date   ENDOMETRIAL ABLATION  2011   INCISE AND DRAIN ABCESS     surgical incision, wound produced mrsa   TUBAL LIGATION  2010   Patient Active Problem List   Diagnosis Date Noted   Helicobacter pylori gastritis 04/15/2021   ADHD (attention deficit hyperactivity disorder) 09/03/2012   Chronic abdominal pain 05/27/2012   Decreased vision 03/19/2012   Tobacco abuse 03/19/2012   Refusal of blood transfusions as patient is Jehovah's Witness 11/11/2011   Adenomyosis    Asthma     REFERRING DIAG: Cervicalgia R sided neck pain   THERAPY DIAG:  Cervicalgia  Abnormal posture  Other low back pain  Rationale for Evaluation and Treatment Rehabilitation  OBJECTIVE: (objective measures completed at  initial evaluation unless otherwise dated)  ONSET DATE: 09/23/22   SUBJECTIVE:                                                                                                                                                                                                          SUBJECTIVE STATEMENT: Pt reports she is doing well and is not experiencing low back pain  today. Pt reorts her neck has not been hurting.   Hand dominance: Right   PAIN:  Are you having pain? Yes: NPRS scale: 10/10 Pain location: back bilateral  Pain description: sore, stabbing , tingling  Aggravating factors: standing, sitting Relieving factors: really nothing much  0-10/10 pain    Yes: NPRS scale: 0/10 Pain location: neck  Pain description: tight  Aggravating factors: not hurt  Relieving factors: meds  PERTINENT HISTORY:  Allergies  Chronic low back pain (spondylolisthesis?)    PRECAUTIONS: None   WEIGHT BEARING RESTRICTIONS: No   FALLS:  Has patient fallen in last 6 months? No   LIVING ENVIRONMENT: Lives with: lives with their family kids 67 , 77, 32  Lives in: House/apartment Stairs: Yes: Internal: 14 steps; on right going up and External: 4 steps; on right going up Has following equipment at home: None   OCCUPATION: Not currently working    PLOF: Independent Son helps carrying items up and down the stairs    PATIENT GOALS: I want to be able to do normal daily activities with no problems    NEXT MD VISIT: unknown    OBJECTIVE: (objective measures completed at initial evaluation unless otherwise dated)   DIAGNOSTIC FINDINGS:  None available    PATIENT SURVEYS:  NDI 44% (22/50)   COGNITION: Overall cognitive status: Within functional limits for tasks assessed   SENSATION: WFL Numbness in Rt UE and Rt LE at times    POSTURE: rounded shoulders and forward head; Increased lordosis   PALPATION: Pain with palpation to bilateral suboccipitals and posterior cervicals.                 CERVICAL ROM:    Active ROM A/PROM (deg) eval  Flexion 30  Extension 55  Right lateral flexion 45  Left lateral flexion 35  Right rotation WFL twinge   Left rotation WFL tight on L    (Blank rows = not tested)   UPPER EXTREMITY ROM: WNL    Active ROM Right eval Left eval  Shoulder flexion      Shoulder extension      Shoulder abduction      Shoulder adduction      Shoulder extension      Shoulder internal rotation      Shoulder external rotation      Elbow flexion      Elbow extension      Wrist flexion      Wrist extension      Wrist ulnar deviation      Wrist radial deviation      Wrist pronation      Wrist supination       (Blank rows = not tested)   UPPER EXTREMITY MMT:   MMT Right eval Left eval  Shoulder flexion 4 4  Shoulder extension      Shoulder abduction 4 4  Shoulder adduction      Shoulder extension      Shoulder internal rotation      Shoulder external rotation      Middle trapezius      Lower trapezius      Elbow flexion 4+ 4+  Elbow extension 4+ 4+  Wrist flexion      Wrist extension      Wrist ulnar deviation      Wrist radial deviation      Wrist pronation      Wrist supination      Grip strength       (  Blank rows = not tested)   CERVICAL SPECIAL TESTS:  NT   FUNCTIONAL TESTS:  NT on eval   MUSCLE LENGTH: Hamstrings: Right WNLs deg; Left WNLs deg  LUMBAR ROM:   Active  A/PROM  11/07/2022  Flexion Full, pulling low back, lordosis does not fully reverse  Extension Full  Right lateral flexion Full  Left lateral flexion Full  Right rotation Full  Left rotation Full   (Blank rows = not tested)  LE ROM: Grossly WNLs Active  Right 11/07/2022 Left 11/07/2022  Hip flexion    Hip extension    Hip abduction    Hip adduction    Hip internal rotation    Hip external rotation    Knee flexion    Knee extension    Ankle dorsiflexion    Ankle plantarflexion    Ankle inversion    Ankle eversion     (Blank rows = not  tested)  LE MMT: Myotome screen negative MMT Right 11/07/2022 Left 11/07/2022  Hip flexion    Hip extension    Hip abduction    Hip adduction    Hip internal rotation    Hip external rotation    Knee flexion    Knee extension    Ankle dorsiflexion    Ankle plantarflexion    Ankle inversion    Ankle eversion     (Blank rows = not tested)  LUMBAR SPECIAL TESTS:  Straight leg raise test: Negative, Slump test: Negative, SI Compression/distraction test: Negative, and FABER test: Negative  GAIT: Distance walked: 200' Assistive device utilized: None Level of assistance: Complete Independence Comments: WNLs    TODAY'S TREATMENT: [facility] Adult PT Treatment:                                                DATE: 11/07/22 Therapeutic Exercise: Nustep L5 UR/LE x 6 minutes Seated forward flexion forward and laterally c swiss ball SKTC  LTR 90/90 ab brace 5 sec x 5  TrA neutral spine via pelvic tilting: Marching Bridge c core engagement x10 STS with hinged hip x10 Q'ped hip extension 2x5 each  [facility] Adult PT Treatment:                                                DATE: 11/05/22 Therapeutic Exercise: Nustep L5 UR/LE x 6 minutes Seated forward flexion forward and laterally c swiss ball Tra neutral spine via pelvic tilting: March, Bent knee fall out (with and without Blue band) 90/90 ab brace 5 sec x 5  Bridge x10 SKTC  LTR Q'ped hip extension 2x5 each  [facility] Adult PT Treatment:                                                DATE: 10/31/22 Therapeutic Exercise: Nustep L5 Ue/LE x 5 minutes Tra neutral spine via pelvic tilting: March, Bent knee fall out (with and without Blue band) 90/90 ab brace 5 sec x 5  Chin tuck  Supine horiz abdct x 10  , Green  PPT to bridge x 8- discomfort in back  [facility]  LTR Hamstring stretch supine with strap  Prone quad stretch with strap   [facility] Adult PT Treatment:                                                DATE: 10/29/22 Therapeutic  Exercise: Supine anterior/posterior pelvic tilt with and without the ball  Knee to chest single and double knee  Stabilization: march, bent knee fall out, SLR  Single leg clam blue band , double leg cues needed  90/90 supine legs hold x 30 sec  LTR x 10 each side  Hamstring 30 sec x 2    [facility] Adult PT Treatment:                                                DATE: 10/21/22 Therapeutic Exercise: Developed, instructed in, and pt completed therex as noted in HEP                                                                                                                               DATE: 10/14/2022   PT, POC, HEP and inclusion of low back in PT Head posture, weight of head/hair and effect on neck pain    PATIENT EDUCATION:  Education details: HEP, POC , core    Person educated: Patient Education method: Explanation, Demonstration, and Handouts Education comprehension: verbalized understanding, returned demonstration, and needs further education   HOME EXERCISE PROGRAM: Access Code: [email or web address] URL: [email or web address] Date: 10/22/2022 Prepared by: Joellyn Rued  Exercises - Supine Cervical Retraction with Towel  - 2 x daily - 7 x weekly - 2 sets - 10 reps - 5 hold - Hooklying Transversus Abdominis Palpation  - 2 x daily - 7 x weekly - 2 sets - 10 reps - 5 hold - Supine Cervical Rotation AROM on Pillow  - 2 x daily - 7 x weekly - 2 sets - 10 reps - 5 hold - Hooklying Single Knee to Chest Stretch  - 2 x daily - 7 x weekly - 1 sets - 3 reps - 20 hold - Supine Double Knee to Chest  - 2 x daily - 7 x weekly - 1 sets - 3 reps - 20 hold - Supine Posterior Pelvic Tilt  - 2 x daily - 7 x weekly - 1 sets - 10 reps - 3 hold - Hooklying Clamshell with Resistance  - 2 x daily - 7 x weekly - 1 sets - 10 reps - 5 hold   ASSESSMENT:   CLINICAL IMPRESSION: PT continued to focused on core engagement for trunk stability. Verbal and tactile cueing were provided to assist with core  engagement. With core engagement pt reports tolerating the  bridging and marching therex. Continued to work on back and hip strength as well for trunk stability. Pt is responding positively to PT per report of decreased pain. Pt notes pain is not affecting her sleep at night. Pt will continue to benefit from skilled PT to improve core strength and stability.  OBJECTIVE IMPAIRMENTS: decreased activity tolerance, decreased mobility, difficulty walking, decreased ROM, decreased strength, increased fascial restrictions, increased muscle spasms, impaired UE functional use, postural dysfunction, obesity, and pain.    ACTIVITY LIMITATIONS: carrying, lifting, bending, sitting, standing, squatting, sleeping, stairs, bed mobility, hygiene/grooming, locomotion level, and caring for others   PARTICIPATION LIMITATIONS: interpersonal relationship, community activity, and occupation   PERSONAL FACTORS: Past/current experiences and 1-2 comorbidities: lumbar spondylolysis  are also affecting patient's functional outcome.    REHAB POTENTIAL: Good   CLINICAL DECISION MAKING: Evolving/moderate complexity   EVALUATION COMPLEXITY: Moderate     GOALS: Goals reviewed with patient? No     LONG TERM GOALS: Target date: 11/25/2022     Pt will be I with HEP for cervical and lumbar stabilization, trunk flexibility  Baseline: unknown  Goal status: Ongoing   2.  Pt will be able to sleep > 5 hours per night due to less pain overall  Baseline: 3-5 hrs  Status: 11/07/22: Pt reports pain is not limiting her sleep Goal status: Improved   3.  Pt will be able to demo normal cervical AROM in all planes  Baseline: limited in all planes with pain  Goal status: INITIAL   4.  Pt will be able to lift items to shoulder hgt with no increase in neck pain  Baseline: painful  Goal status: INITIAL   5. NDI score will improve to 34% to demo improved functional mobility.             Baseline: 44%            Goal status: INITIAL    6. Pt wil report a 50% or greater improvement with low back pain for improved function of her trunk and QOL    Baseline: 0-10/10\            Goal status: INITIAL    PLAN:   PT FREQUENCY: 2x/week   PT DURATION: 6 weeks   PLANNED INTERVENTIONS: Therapeutic exercises, Therapeutic activity, Neuromuscular re-education, Patient/Family education, Self Care, Joint mobilization, Dry Needling, Electrical stimulation, Spinal manipulation, Spinal mobilization, Cryotherapy, Moist heat, Manual therapy, and Re-evaluation   PLAN FOR NEXT SESSION: check HEP,  , manual if tolerate, posture , consider quadruped progression  [employer] MS, PT 11/07/22 12:49 PM

## 2022-11-07 ENCOUNTER — Ambulatory Visit: Payer: Medicaid Other

## 2022-11-07 DIAGNOSIS — M542 Cervicalgia: Secondary | ICD-10-CM | POA: Diagnosis not present

## 2022-11-07 DIAGNOSIS — R293 Abnormal posture: Secondary | ICD-10-CM

## 2022-11-07 DIAGNOSIS — M5459 Other low back pain: Secondary | ICD-10-CM

## 2022-11-07 NOTE — Therapy (Unsigned)
OUTPATIENT PHYSICAL THERAPY TREATMENT NOTE   Patient Name: [name] MRN: [id number] DOB:[birth year], 37 y.o., female Today's Date: 11/10/2022  PCP: PCP: Jerre Simon MD   REFERRING PROVIDER: REFERRING PROVIDER: Margart Sickles   END OF SESSION:   PT End of Session - 11/10/22 1507     Visit Number 7    Number of Visits 12    Date for PT Re-Evaluation 11/25/22    Authorization Type [place] [employer]    Authorization - Visit Number 7    Authorization - Number of Visits 27    PT Start Time 1504    PT Stop Time 1555    PT Time Calculation (min) 51 min    Activity Tolerance Patient tolerated treatment well    Behavior During Therapy [facility] for tasks assessed/performed               Past Medical History:  Diagnosis Date   Adenomyosis 2012   Planning hysterectomy    ADHD (attention deficit hyperactivity disorder)    NO MEDS   AKI (acute kidney injury) (HCC) 06/02/2015   Allergy 1988   Hay fever since birth    Asthma 1988   BV (bacterial vaginosis) 10/07/2018   Helicobacter pylori gastritis 04/15/2021   Lumbar spondylolysis    Pyelonephritis 06/02/2015   Sepsis secondary to UTI (HCC) 06/05/2015   Thyromegaly    H/O   Past Surgical History:  Procedure Laterality Date   ENDOMETRIAL ABLATION  2011   INCISE AND DRAIN ABCESS     surgical incision, wound produced mrsa   TUBAL LIGATION  2010   Patient Active Problem List   Diagnosis Date Noted   Helicobacter pylori gastritis 04/15/2021   ADHD (attention deficit hyperactivity disorder) 09/03/2012   Chronic abdominal pain 05/27/2012   Decreased vision 03/19/2012   Tobacco abuse 03/19/2012   Refusal of blood transfusions as patient is Jehovah's Witness 11/11/2011   Adenomyosis    Asthma     REFERRING DIAG: Cervicalgia R sided neck pain   THERAPY DIAG:  Cervicalgia  Abnormal posture  Other low back pain  Rationale for Evaluation and Treatment Rehabilitation  OBJECTIVE: (objective measures completed at initial  evaluation unless otherwise dated)  ONSET DATE: 09/23/22   SUBJECTIVE:                                                                                                                                                                                                          SUBJECTIVE STATEMENT: I'm finally not sore after last session.  Pt reports some personal stressors  today. Reports she "almost cancelled" her appt.    PAIN:  Are you having pain? Yes: NPRS scale: 1-2/10 Pain location: back bilateral  Pain description: sore, stabbing , tingling  Aggravating factors: standing, sitting Relieving factors: really nothing much  0-10/10 pain    Yes: NPRS scale: 0/10 Pain location: neck  Pain description: tight  Aggravating factors: not hurt  Relieving factors: meds  PERTINENT HISTORY:  Allergies  Chronic low back pain (spondylolisthesis?)    PRECAUTIONS: None   WEIGHT BEARING RESTRICTIONS: No   FALLS:  Has patient fallen in last 6 months? No   LIVING ENVIRONMENT: Lives with: lives with their family kids 34 , 68, 8  Lives in: House/apartment Stairs: Yes: Internal: 14 steps; on right going up and External: 4 steps; on right going up Has following equipment at home: None   OCCUPATION: Not currently working    PLOF: Independent Son helps carrying items up and down the stairs    PATIENT GOALS: I want to be able to do normal daily activities with no problems    NEXT MD VISIT: unknown    OBJECTIVE: (objective measures completed at initial evaluation unless otherwise dated)   DIAGNOSTIC FINDINGS:  None available    PATIENT SURVEYS:  NDI 44% (22/50)   COGNITION: Overall cognitive status: Within functional limits for tasks assessed   SENSATION: WFL Numbness in Rt UE and Rt LE at times    POSTURE: rounded shoulders and forward head; Increased lordosis   PALPATION: Pain with palpation to bilateral suboccipitals and posterior cervicals.                CERVICAL ROM:     Active ROM A/PROM (deg) eval  Flexion 30  Extension 55  Right lateral flexion 45  Left lateral flexion 35  Right rotation WFL twinge   Left rotation WFL tight on L    (Blank rows = not tested)   UPPER EXTREMITY ROM: WNL    Active ROM Right eval Left eval  Shoulder flexion      Shoulder extension      Shoulder abduction      Shoulder adduction      Shoulder extension      Shoulder internal rotation      Shoulder external rotation      Elbow flexion      Elbow extension      Wrist flexion      Wrist extension      Wrist ulnar deviation      Wrist radial deviation      Wrist pronation      Wrist supination       (Blank rows = not tested)   UPPER EXTREMITY MMT:   MMT Right eval Left eval  Shoulder flexion 4 4  Shoulder extension      Shoulder abduction 4 4  Shoulder adduction      Shoulder extension      Shoulder internal rotation      Shoulder external rotation      Middle trapezius      Lower trapezius      Elbow flexion 4+ 4+  Elbow extension 4+ 4+  Wrist flexion      Wrist extension      Wrist ulnar deviation      Wrist radial deviation      Wrist pronation      Wrist supination      Grip strength       (Blank rows = not tested)  CERVICAL SPECIAL TESTS:  NT   FUNCTIONAL TESTS:  NT on eval   MUSCLE LENGTH: Hamstrings: Right WNLs deg; Left WNLs deg  LUMBAR ROM:   Active  A/PROM  11/10/2022  Flexion Full, pulling low back, lordosis does not fully reverse  Extension Full  Right lateral flexion Full  Left lateral flexion Full  Right rotation Full  Left rotation Full   (Blank rows = not tested)  LE ROM: Grossly WNLs Active  Right 11/10/2022 Left 11/10/2022  Hip flexion    Hip extension    Hip abduction    Hip adduction    Hip internal rotation    Hip external rotation    Knee flexion    Knee extension    Ankle dorsiflexion    Ankle plantarflexion    Ankle inversion    Ankle eversion     (Blank rows = not tested)  LE MMT: Myotome  screen negative MMT Right 11/10/2022 Left 11/10/2022  Hip flexion    Hip extension    Hip abduction    Hip adduction    Hip internal rotation    Hip external rotation    Knee flexion    Knee extension    Ankle dorsiflexion    Ankle plantarflexion    Ankle inversion    Ankle eversion     (Blank rows = not tested)  LUMBAR SPECIAL TESTS:  Straight leg raise test: Negative, Slump test: Negative, SI Compression/distraction test: Negative, and FABER test: Negative  GAIT: Distance walked: 200' Assistive device utilized: None Level of assistance: Complete Independence Comments: WNLs    TODAY'S TREATMENT:   [facility] Adult PT Treatment:                                                DATE: 11/10/22 Therapeutic Exercise: UBE level 1 in reverse for 5 min  Row and extension using slastix  Palloff press x 10  with rotation x 10  Supine shoulder extension x 10 blue  Banded bridge x 10  Bridge with clam  x 15 unilateral LTR with knees crossed and without Spine rotation to L Quadruped lateral trunk stretch  T spine thread the needle  Manual Therapy: Sidelying Rt QL stretch with light soft tissue work to [employer] and surrounding areas Joint mobs L3-L4-L5 Manual distraction Rt LE  STW to QL compression   MHP 10 min lumbar    [facility] Adult PT Treatment:                                                DATE: 11/05/22 Therapeutic Exercise: Nustep L5 UR/LE x 6 minutes Seated forward flexion forward and laterally c swiss ball Tra neutral spine via pelvic tilting: March, Bent knee fall out (with and without Blue band) 90/90 ab brace 5 sec x 5  Bridge x10 SKTC  LTR Q'ped hip extension 2x5 each  [facility] Adult PT Treatment:                                                DATE: 10/31/22 Therapeutic Exercise: Nustep L5 Ue/LE x 5  minutes Tra neutral spine via pelvic tilting: March, Bent knee fall out (with and without Blue band) 90/90 ab brace 5 sec x 5  Chin tuck  Supine horiz abdct x 10  , Green  PPT to  bridge x 8- discomfort in back  SKTC  LTR Hamstring stretch supine with strap  Prone quad stretch with strap   [facility] Adult PT Treatment:                                                DATE: 10/29/22 Therapeutic Exercise: Supine anterior/posterior pelvic tilt with and without the ball  Knee to chest single and double knee  Stabilization: march, bent knee fall out, SLR  Single leg clam blue band , double leg cues needed  90/90 supine legs hold x 30 sec  LTR x 10 each side  Hamstring 30 sec x 2    [facility] Adult PT Treatment:                                                DATE: 10/21/22 Therapeutic Exercise: Developed, instructed in, and pt completed therex as noted in HEP                                                                                                                               DATE: 10/14/2022   PT, POC, HEP and inclusion of low back in PT Head posture, weight of head/hair and effect on neck pain    PATIENT EDUCATION:  Education details: HEP, POC , core    Person educated: Patient Education method: Explanation, Demonstration, and Handouts Education comprehension: verbalized understanding, returned demonstration, and needs further education   HOME EXERCISE PROGRAM: Access Code: [email or web address] URL: [email or web address] Date: 10/22/2022 Prepared by: Joellyn Rued  Exercises - Supine Cervical Retraction with Towel  - 2 x daily - 7 x weekly - 2 sets - 10 reps - 5 hold - Hooklying Transversus Abdominis Palpation  - 2 x daily - 7 x weekly - 2 sets - 10 reps - 5 hold - Supine Cervical Rotation AROM on Pillow  - 2 x daily - 7 x weekly - 2 sets - 10 reps - 5 hold - Hooklying Single Knee to Chest Stretch  - 2 x daily - 7 x weekly - 1 sets - 3 reps - 20 hold - Supine Double Knee to Chest  - 2 x daily - 7 x weekly - 1 sets - 3 reps - 20 hold - Supine Posterior Pelvic Tilt  - 2 x daily - 7 x weekly - 1 sets - 10 reps - 3 hold - Hooklying Clamshell with Resistance  - 2 x  daily -  7 x weekly - 1 sets - 10 reps - 5 hold   ASSESSMENT:   CLINICAL IMPRESSION: Patient doing well, no pain today.  She feels like she can better manage her pain , especially at night.  Pain usually does not limited her sleep.  She still has difficulty standing and sitting for long periods of time. Focused on core and strength in hips today. Rt side of her back is more painful with her exercises (LTR to Rt ) . With manual today, she had Rt Quadratus lumborum and Rt glute med pain, tightness.  She was able to get cavitation with quadruped stretching for T spine and lateral trunk.  After session she was to rotate to the Rt with less pain .   OBJECTIVE IMPAIRMENTS: decreased activity tolerance, decreased mobility, difficulty walking, decreased ROM, decreased strength, increased fascial restrictions, increased muscle spasms, impaired UE functional use, postural dysfunction, obesity, and pain.    ACTIVITY LIMITATIONS: carrying, lifting, bending, sitting, standing, squatting, sleeping, stairs, bed mobility, hygiene/grooming, locomotion level, and caring for others   PARTICIPATION LIMITATIONS: interpersonal relationship, community activity, and occupation   PERSONAL FACTORS: Past/current experiences and 1-2 comorbidities: lumbar spondylolysis  are also affecting patient's functional outcome.    REHAB POTENTIAL: Good   CLINICAL DECISION MAKING: Evolving/moderate complexity   EVALUATION COMPLEXITY: Moderate     GOALS: Goals reviewed with patient? No     LONG TERM GOALS: Target date: 11/25/2022     Pt will be I with HEP for cervical and lumbar stabilization, trunk flexibility  Baseline: unknown  Goal status: ongoing    2.  Pt will be able to sleep > 5 hours per night due to less pain overall  Baseline: 3-5 hrs 11/10/22: no longer limited by pain  Goal status: MET    3.  Pt will be able to demo normal cervical AROM in all planes  Baseline: limited in all planes with pain  Goal status:  INITIAL   4.  Pt will be able to lift items to shoulder hgt with no increase in neck pain  Baseline: painful  Goal status: INITIAL   5. NDI score will improve to 34% to demo improved functional mobility.             Baseline: 44%            Goal status: INITIAL   6. Pt wil report a 50% or greater improvement with low back pain for improved function of her trunk and QOL    Baseline: 0-10/10\            Goal status: INITIAL    PLAN:   PT FREQUENCY: 2x/week   PT DURATION: 6 weeks   PLANNED INTERVENTIONS: Therapeutic exercises, Therapeutic activity, Neuromuscular re-education, Patient/Family education, Self Care, Joint mobilization, Dry Needling, Electrical stimulation, Spinal manipulation, Spinal mobilization, Cryotherapy, Moist heat, Manual therapy, and Re-evaluation   PLAN FOR NEXT SESSION: recheck ROM, goals . manual if tolerate, cont core and maybe DN to Rt QL? posture , consider quadruped progression  Karie Mainland, PT 11/10/22 7:36 PM Phone: [phone] Fax: [phone]

## 2022-11-10 ENCOUNTER — Ambulatory Visit: Payer: Medicaid Other | Attending: Physician Assistant | Admitting: Physical Therapy

## 2022-11-10 DIAGNOSIS — R293 Abnormal posture: Secondary | ICD-10-CM | POA: Diagnosis present

## 2022-11-10 DIAGNOSIS — M542 Cervicalgia: Secondary | ICD-10-CM | POA: Insufficient documentation

## 2022-11-10 DIAGNOSIS — M5459 Other low back pain: Secondary | ICD-10-CM | POA: Diagnosis present

## 2022-11-11 NOTE — Therapy (Signed)
OUTPATIENT PHYSICAL THERAPY TREATMENT NOTE   Patient Name: [name] MRN: [id number] DOB:[birth year], 37 y.o., female Today's Date: 11/12/2022  PCP: PCP: Jerre Simon MD   REFERRING PROVIDER: REFERRING PROVIDER: Margart Sickles   END OF SESSION:   PT End of Session - 11/12/22 1625     Visit Number 8    Number of Visits 12    Date for PT Re-Evaluation 11/25/22    Authorization Type [place] [employer]    Authorization - Visit Number 8    Authorization - Number of Visits 27    PT Start Time 1630    PT Stop Time 1700    PT Time Calculation (min) 30 min    Activity Tolerance Patient tolerated treatment well    Behavior During Therapy [facility] for tasks assessed/performed                Past Medical History:  Diagnosis Date   Adenomyosis 2012   Planning hysterectomy    ADHD (attention deficit hyperactivity disorder)    NO MEDS   AKI (acute kidney injury) (HCC) 06/02/2015   Allergy 1988   Hay fever since birth    Asthma 1988   BV (bacterial vaginosis) 10/07/2018   Helicobacter pylori gastritis 04/15/2021   Lumbar spondylolysis    Pyelonephritis 06/02/2015   Sepsis secondary to UTI (HCC) 06/05/2015   Thyromegaly    H/O   Past Surgical History:  Procedure Laterality Date   ENDOMETRIAL ABLATION  2011   INCISE AND DRAIN ABCESS     surgical incision, wound produced mrsa   TUBAL LIGATION  2010   Patient Active Problem List   Diagnosis Date Noted   Helicobacter pylori gastritis 04/15/2021   ADHD (attention deficit hyperactivity disorder) 09/03/2012   Chronic abdominal pain 05/27/2012   Decreased vision 03/19/2012   Tobacco abuse 03/19/2012   Refusal of blood transfusions as patient is Jehovah's Witness 11/11/2011   Adenomyosis    Asthma     REFERRING DIAG: Cervicalgia R sided neck pain   THERAPY DIAG:  Cervicalgia  Abnormal posture  Other low back pain  Rationale for Evaluation and Treatment Rehabilitation  OBJECTIVE: (objective measures completed at  initial evaluation unless otherwise dated)  ONSET DATE: 09/23/22   SUBJECTIVE:                                                                                                                                                                                                          SUBJECTIVE STATEMENT: Pt reports she is overall better, tolerating prolonged standing and walking. She  denies pain today. Pt notes she walked 3 miles earlier today.  PAIN:  Are you having pain? Yes: NPRS scale: 0/10 Pain location: back bilateral  Pain description: sore, stabbing , tingling  Aggravating factors: standing, sitting Relieving factors: really nothing much  0-10/10 pain    Yes: NPRS scale: 0/10 Pain location: neck  Pain description: tight  Aggravating factors: not hurt  Relieving factors: meds  PERTINENT HISTORY:  Allergies  Chronic low back pain (spondylolisthesis?)    PRECAUTIONS: None   WEIGHT BEARING RESTRICTIONS: No   FALLS:  Has patient fallen in last 6 months? No   LIVING ENVIRONMENT: Lives with: lives with their family kids 33 , 62, 23  Lives in: House/apartment Stairs: Yes: Internal: 14 steps; on right going up and External: 4 steps; on right going up Has following equipment at home: None   OCCUPATION: Not currently working    PLOF: Independent Son helps carrying items up and down the stairs    PATIENT GOALS: I want to be able to do normal daily activities with no problems    NEXT MD VISIT: unknown    OBJECTIVE: (objective measures completed at initial evaluation unless otherwise dated)   DIAGNOSTIC FINDINGS:  None available    PATIENT SURVEYS:  NDI 44% (22/50)   COGNITION: Overall cognitive status: Within functional limits for tasks assessed   SENSATION: WFL Numbness in Rt UE and Rt LE at times    POSTURE: rounded shoulders and forward head; Increased lordosis   PALPATION: Pain with palpation to bilateral suboccipitals and posterior cervicals.                 CERVICAL ROM:    Active ROM A/PROM (deg) eval  Flexion 30  Extension 55  Right lateral flexion 45  Left lateral flexion 35  Right rotation WFL twinge   Left rotation WFL tight on L    (Blank rows = not tested)   UPPER EXTREMITY ROM: WNL    Active ROM Right eval Left eval  Shoulder flexion      Shoulder extension      Shoulder abduction      Shoulder adduction      Shoulder extension      Shoulder internal rotation      Shoulder external rotation      Elbow flexion      Elbow extension      Wrist flexion      Wrist extension      Wrist ulnar deviation      Wrist radial deviation      Wrist pronation      Wrist supination       (Blank rows = not tested)   UPPER EXTREMITY MMT:   MMT Right eval Left eval  Shoulder flexion 4 4  Shoulder extension      Shoulder abduction 4 4  Shoulder adduction      Shoulder extension      Shoulder internal rotation      Shoulder external rotation      Middle trapezius      Lower trapezius      Elbow flexion 4+ 4+  Elbow extension 4+ 4+  Wrist flexion      Wrist extension      Wrist ulnar deviation      Wrist radial deviation      Wrist pronation      Wrist supination      Grip strength       (Blank rows =  not tested)   CERVICAL SPECIAL TESTS:  NT   FUNCTIONAL TESTS:  NT on eval   MUSCLE LENGTH: Hamstrings: Right WNLs deg; Left WNLs deg  LUMBAR ROM:   Active  A/PROM  11/12/2022  Flexion Full, pulling low back, lordosis does not fully reverse  Extension Full  Right lateral flexion Full  Left lateral flexion Full  Right rotation Full  Left rotation Full   (Blank rows = not tested)  LE ROM: Grossly WNLs Active  Right 11/12/2022 Left 11/12/2022  Hip flexion    Hip extension    Hip abduction    Hip adduction    Hip internal rotation    Hip external rotation    Knee flexion    Knee extension    Ankle dorsiflexion    Ankle plantarflexion    Ankle inversion    Ankle eversion     (Blank rows = not  tested)  LE MMT: Myotome screen negative MMT Right 11/12/2022 Left 11/12/2022  Hip flexion    Hip extension    Hip abduction    Hip adduction    Hip internal rotation    Hip external rotation    Knee flexion    Knee extension    Ankle dorsiflexion    Ankle plantarflexion    Ankle inversion    Ankle eversion     (Blank rows = not tested)  LUMBAR SPECIAL TESTS:  Straight leg raise test: Negative, Slump test: Negative, SI Compression/distraction test: Negative, and FABER test: Negative  GAIT: Distance walked: 200' Assistive device utilized: None Level of assistance: Complete Independence Comments: WNLs    TODAY'S TREATMENT: [facility] Adult PT Treatment:                                                DATE: 11/12/22 Therapeutic Exercise: Supine 90/90 abdominal bracing x5 10" Supine 90/90 alt heel taps 3x10 Prone on 2 pillows alt leg ext x10 Prone on 2 pillows alt arm/leg lifts 2x10 Palloff press 2x10 double green band Child's pose forward and laterally  [facility] Adult PT Treatment:                                                DATE: 11/10/22 Therapeutic Exercise: UBE level 1 in reverse for 5 min  Row and extension using slastix  Palloff press x 10  with rotation x 10  Supine shoulder extension x 10 blue  Banded bridge x 10  Bridge with clam  x 15 unilateral LTR with knees crossed and without Spine rotation to L Quadruped lateral trunk stretch  T spine thread the needle  Manual Therapy: Sidelying Rt QL stretch with light soft tissue work to [employer] and surrounding areas Joint mobs L3-L4-L5 Manual distraction Rt LE  STW to QL compression   MHP 10 min lumbar    [facility] Adult PT Treatment:                                                DATE: 11/05/22 Therapeutic Exercise: Nustep L5 UR/LE x 6 minutes Seated forward flexion forward and laterally c  swiss ball Tra neutral spine via pelvic tilting: March, Bent knee fall out (with and without Blue band) 90/90 ab brace 5 sec x 5  Bridge  x10 SKTC  LTR Q'ped hip extension 2x5 each    PATIENT EDUCATION:  Education details: HEP, POC , core    Person educated: Patient Education method: Explanation, Demonstration, and Handouts Education comprehension: verbalized understanding, returned demonstration, and needs further education   HOME EXERCISE PROGRAM: Access Code: [email or web address] URL: [email or web address] Date: 10/22/2022 Prepared by: Joellyn Rued  Exercises - Supine Cervical Retraction with Towel  - 2 x daily - 7 x weekly - 2 sets - 10 reps - 5 hold - Hooklying Transversus Abdominis Palpation  - 2 x daily - 7 x weekly - 2 sets - 10 reps - 5 hold - Supine Cervical Rotation AROM on Pillow  - 2 x daily - 7 x weekly - 2 sets - 10 reps - 5 hold - Hooklying Single Knee to Chest Stretch  - 2 x daily - 7 x weekly - 1 sets - 3 reps - 20 hold - Supine Double Knee to Chest  - 2 x daily - 7 x weekly - 1 sets - 3 reps - 20 hold - Supine Posterior Pelvic Tilt  - 2 x daily - 7 x weekly - 1 sets - 10 reps - 3 hold - Hooklying Clamshell with Resistance  - 2 x daily - 7 x weekly - 1 sets - 10 reps - 5 hold   ASSESSMENT:   CLINICAL IMPRESSION: The PT appt today was shorted due to a conflict in schedule. Pt continues to report her low back pain and tolerance to activity to be improved. PT was focused today for core and back strengthening for trunk stability. Pt tolerated the prescribed therex with adverse effects. Will assess for continuation of PT vs preparation for DC over the next 2 to 4 PT sessions. Will considered TPDN for the 11/19/22 appt. Pt will continue to benefit from skilled PT to address impairments for improved trunk function with less pain.  OBJECTIVE IMPAIRMENTS: decreased activity tolerance, decreased mobility, difficulty walking, decreased ROM, decreased strength, increased fascial restrictions, increased muscle spasms, impaired UE functional use, postural dysfunction, obesity, and pain.    ACTIVITY LIMITATIONS:  carrying, lifting, bending, sitting, standing, squatting, sleeping, stairs, bed mobility, hygiene/grooming, locomotion level, and caring for others   PARTICIPATION LIMITATIONS: interpersonal relationship, community activity, and occupation   PERSONAL FACTORS: Past/current experiences and 1-2 comorbidities: lumbar spondylolysis  are also affecting patient's functional outcome.    REHAB POTENTIAL: Good   CLINICAL DECISION MAKING: Evolving/moderate complexity   EVALUATION COMPLEXITY: Moderate     GOALS: Goals reviewed with patient? No     LONG TERM GOALS: Target date: 11/25/2022     Pt will be I with HEP for cervical and lumbar stabilization, trunk flexibility  Baseline: unknown  Goal status: ongoing    2.  Pt will be able to sleep > 5 hours per night due to less pain overall  Baseline: 3-5 hrs 11/10/22: no longer limited by pain  Goal status: MET    3.  Pt will be able to demo normal cervical AROM in all planes  Baseline: limited in all planes with pain  Goal status: INITIAL   4.  Pt will be able to lift items to shoulder hgt with no increase in neck pain  Baseline: painful  Goal status: INITIAL   5. NDI score will improve to 34% to demo improved functional mobility.  Baseline: 44%            Goal status: INITIAL   6. Pt wil report a 50% or greater improvement with low back pain for improved function of her trunk and QOL    Baseline: 0-10/10\            Goal status: INITIAL    PLAN:   PT FREQUENCY: 2x/week   PT DURATION: 6 weeks   PLANNED INTERVENTIONS: Therapeutic exercises, Therapeutic activity, Neuromuscular re-education, Patient/Family education, Self Care, Joint mobilization, Dry Needling, Electrical stimulation, Spinal manipulation, Spinal mobilization, Cryotherapy, Moist heat, Manual therapy, and Re-evaluation   PLAN FOR NEXT SESSION: recheck ROM, goals . manual if tolerate, cont core and maybe DN to Rt QL? posture , consider quadruped  progression  [employer] MS, PT 11/12/22 5:22 PM

## 2022-11-12 ENCOUNTER — Ambulatory Visit: Payer: Medicaid Other

## 2022-11-12 DIAGNOSIS — R293 Abnormal posture: Secondary | ICD-10-CM

## 2022-11-12 DIAGNOSIS — M542 Cervicalgia: Secondary | ICD-10-CM | POA: Diagnosis not present

## 2022-11-12 DIAGNOSIS — M5459 Other low back pain: Secondary | ICD-10-CM

## 2022-11-16 NOTE — Therapy (Deleted)
OUTPATIENT PHYSICAL THERAPY TREATMENT NOTE   Patient Name: [name] MRN: [id number] DOB:1986/04/14, 37 y.o., female Today's Date: 11/16/2022  PCP: PCP: Jerre Simon MD   REFERRING PROVIDER: REFERRING PROVIDER: Margart Sickles   END OF SESSION:        Past Medical History:  Diagnosis Date   Adenomyosis 2012   Planning hysterectomy    ADHD (attention deficit hyperactivity disorder)    NO MEDS   AKI (acute kidney injury) (HCC) 06/02/2015   Allergy 1988   Hay fever since birth    Asthma 1988   BV (bacterial vaginosis) 10/07/2018   Helicobacter pylori gastritis 04/15/2021   Lumbar spondylolysis    Pyelonephritis 06/02/2015   Sepsis secondary to UTI (HCC) 06/05/2015   Thyromegaly    H/O   Past Surgical History:  Procedure Laterality Date   ENDOMETRIAL ABLATION  2011   INCISE AND DRAIN ABCESS     surgical incision, wound produced mrsa   TUBAL LIGATION  2010   Patient Active Problem List   Diagnosis Date Noted   Helicobacter pylori gastritis 04/15/2021   ADHD (attention deficit hyperactivity disorder) 09/03/2012   Chronic abdominal pain 05/27/2012   Decreased vision 03/19/2012   Tobacco abuse 03/19/2012   Refusal of blood transfusions as patient is Jehovah's Witness 11/11/2011   Adenomyosis    Asthma     REFERRING DIAG: Cervicalgia R sided neck pain   THERAPY DIAG:  No diagnosis found.  Rationale for Evaluation and Treatment Rehabilitation  OBJECTIVE: (objective measures completed at initial evaluation unless otherwise dated)  ONSET DATE: 09/23/22   SUBJECTIVE:                                                                                                                                                                                                          SUBJECTIVE STATEMENT: Pt reports she is overall better, tolerating prolonged standing and walking. She denies pain today. Pt notes she walked 3 miles earlier today.  PAIN:  Are you having  pain? Yes: NPRS scale: 0/10 Pain location: back bilateral  Pain description: sore, stabbing , tingling  Aggravating factors: standing, sitting Relieving factors: really nothing much  0-10/10 pain    Yes: NPRS scale: 0/10 Pain location: neck  Pain description: tight  Aggravating factors: not hurt  Relieving factors: meds  PERTINENT HISTORY:  Allergies  Chronic low back pain (spondylolisthesis?)    PRECAUTIONS: None   WEIGHT BEARING RESTRICTIONS: No   FALLS:  Has patient fallen in last 6 months? No   LIVING ENVIRONMENT: Lives with: lives with their  family kids 87 , 62, 36  Lives in: House/apartment Stairs: Yes: Internal: 14 steps; on right going up and External: 4 steps; on right going up Has following equipment at home: None   OCCUPATION: Not currently working    PLOF: Independent Son helps carrying items up and down the stairs    PATIENT GOALS: I want to be able to do normal daily activities with no problems    NEXT MD VISIT: unknown    OBJECTIVE: (objective measures completed at initial evaluation unless otherwise dated)   DIAGNOSTIC FINDINGS:  None available    PATIENT SURVEYS:  NDI 44% (22/50)   COGNITION: Overall cognitive status: Within functional limits for tasks assessed   SENSATION: WFL Numbness in Rt UE and Rt LE at times    POSTURE: rounded shoulders and forward head; Increased lordosis   PALPATION: Pain with palpation to bilateral suboccipitals and posterior cervicals.                CERVICAL ROM:    Active ROM A/PROM (deg) eval  Flexion 30  Extension 55  Right lateral flexion 45  Left lateral flexion 35  Right rotation WFL twinge   Left rotation WFL tight on L    (Blank rows = not tested)   UPPER EXTREMITY ROM: WNL    Active ROM Right eval Left eval  Shoulder flexion      Shoulder extension      Shoulder abduction      Shoulder adduction      Shoulder extension      Shoulder internal rotation      Shoulder external rotation       Elbow flexion      Elbow extension      Wrist flexion      Wrist extension      Wrist ulnar deviation      Wrist radial deviation      Wrist pronation      Wrist supination       (Blank rows = not tested)   UPPER EXTREMITY MMT:   MMT Right eval Left eval  Shoulder flexion 4 4  Shoulder extension      Shoulder abduction 4 4  Shoulder adduction      Shoulder extension      Shoulder internal rotation      Shoulder external rotation      Middle trapezius      Lower trapezius      Elbow flexion 4+ 4+  Elbow extension 4+ 4+  Wrist flexion      Wrist extension      Wrist ulnar deviation      Wrist radial deviation      Wrist pronation      Wrist supination      Grip strength       (Blank rows = not tested)   CERVICAL SPECIAL TESTS:  NT   FUNCTIONAL TESTS:  NT on eval   MUSCLE LENGTH: Hamstrings: Right WNLs deg; Left WNLs deg  LUMBAR ROM:   Active  A/PROM  11/16/2022  Flexion Full, pulling low back, lordosis does not fully reverse  Extension Full  Right lateral flexion Full  Left lateral flexion Full  Right rotation Full  Left rotation Full   (Blank rows = not tested)  LE ROM: Grossly WNLs Active  Right 11/16/2022 Left 11/16/2022  Hip flexion    Hip extension    Hip abduction    Hip adduction    Hip internal rotation  Hip external rotation    Knee flexion    Knee extension    Ankle dorsiflexion    Ankle plantarflexion    Ankle inversion    Ankle eversion     (Blank rows = not tested)  LE MMT: Myotome screen negative MMT Right 11/16/2022 Left 11/16/2022  Hip flexion    Hip extension    Hip abduction    Hip adduction    Hip internal rotation    Hip external rotation    Knee flexion    Knee extension    Ankle dorsiflexion    Ankle plantarflexion    Ankle inversion    Ankle eversion     (Blank rows = not tested)  LUMBAR SPECIAL TESTS:  Straight leg raise test: Negative, Slump test: Negative, SI Compression/distraction test: Negative, and  FABER test: Negative  GAIT: Distance walked: 200' Assistive device utilized: None Level of assistance: Complete Independence Comments: WNLs    TODAY'S TREATMENT: [facility] Adult PT Treatment:                                                DATE: 11/12/22 Therapeutic Exercise: Supine 90/90 abdominal bracing x5 10" Supine 90/90 alt heel taps 3x10 Prone on 2 pillows alt leg ext x10 Prone on 2 pillows alt arm/leg lifts 2x10 Palloff press 2x10 double green band Child's pose forward and laterally  [facility] Adult PT Treatment:                                                DATE: 11/10/22 Therapeutic Exercise: UBE level 1 in reverse for 5 min  Row and extension using slastix  Palloff press x 10  with rotation x 10  Supine shoulder extension x 10 blue  Banded bridge x 10  Bridge with clam  x 15 unilateral LTR with knees crossed and without Spine rotation to L Quadruped lateral trunk stretch  T spine thread the needle  Manual Therapy: Sidelying Rt QL stretch with light soft tissue work to [employer] and surrounding areas Joint mobs L3-L4-L5 Manual distraction Rt LE  STW to QL compression   MHP 10 min lumbar    [facility] Adult PT Treatment:                                                DATE: 11/05/22 Therapeutic Exercise: Nustep L5 UR/LE x 6 minutes Seated forward flexion forward and laterally c swiss ball Tra neutral spine via pelvic tilting: March, Bent knee fall out (with and without Blue band) 90/90 ab brace 5 sec x 5  Bridge x10 SKTC  LTR Q'ped hip extension 2x5 each    PATIENT EDUCATION:  Education details: HEP, POC , core    Person educated: Patient Education method: Explanation, Demonstration, and Handouts Education comprehension: verbalized understanding, returned demonstration, and needs further education   HOME EXERCISE PROGRAM: Access Code: [email or web address] URL: [email or web address] Date: 10/22/2022 Prepared by: Joellyn Rued  Exercises - Supine Cervical Retraction with  Towel  - 2 x daily - 7 x weekly - 2 sets - 10 reps - 5 hold - Hooklying  Transversus Abdominis Palpation  - 2 x daily - 7 x weekly - 2 sets - 10 reps - 5 hold - Supine Cervical Rotation AROM on Pillow  - 2 x daily - 7 x weekly - 2 sets - 10 reps - 5 hold - Hooklying Single Knee to Chest Stretch  - 2 x daily - 7 x weekly - 1 sets - 3 reps - 20 hold - Supine Double Knee to Chest  - 2 x daily - 7 x weekly - 1 sets - 3 reps - 20 hold - Supine Posterior Pelvic Tilt  - 2 x daily - 7 x weekly - 1 sets - 10 reps - 3 hold - Hooklying Clamshell with Resistance  - 2 x daily - 7 x weekly - 1 sets - 10 reps - 5 hold   ASSESSMENT:   CLINICAL IMPRESSION: The PT appt today was shorted due to a conflict in schedule. Pt continues to report her low back pain and tolerance to activity to be improved. PT was focused today for core and back strengthening for trunk stability. Pt tolerated the prescribed therex with adverse effects. Will assess for continuation of PT vs preparation for DC over the next 2 to 4 PT sessions. Will considered TPDN for the 11/19/22 appt. Pt will continue to benefit from skilled PT to address impairments for improved trunk function with less pain.  OBJECTIVE IMPAIRMENTS: decreased activity tolerance, decreased mobility, difficulty walking, decreased ROM, decreased strength, increased fascial restrictions, increased muscle spasms, impaired UE functional use, postural dysfunction, obesity, and pain.    ACTIVITY LIMITATIONS: carrying, lifting, bending, sitting, standing, squatting, sleeping, stairs, bed mobility, hygiene/grooming, locomotion level, and caring for others   PARTICIPATION LIMITATIONS: interpersonal relationship, community activity, and occupation   PERSONAL FACTORS: Past/current experiences and 1-2 comorbidities: lumbar spondylolysis  are also affecting patient's functional outcome.    REHAB POTENTIAL: Good   CLINICAL DECISION MAKING: Evolving/moderate complexity   EVALUATION  COMPLEXITY: Moderate     GOALS: Goals reviewed with patient? No     LONG TERM GOALS: Target date: 11/25/2022     Pt will be I with HEP for cervical and lumbar stabilization, trunk flexibility  Baseline: unknown  Goal status: ongoing    2.  Pt will be able to sleep > 5 hours per night due to less pain overall  Baseline: 3-5 hrs 11/10/22: no longer limited by pain  Goal status: MET    3.  Pt will be able to demo normal cervical AROM in all planes  Baseline: limited in all planes with pain  Goal status: INITIAL   4.  Pt will be able to lift items to shoulder hgt with no increase in neck pain  Baseline: painful  Goal status: INITIAL   5. NDI score will improve to 34% to demo improved functional mobility.             Baseline: 44%            Goal status: INITIAL   6. Pt wil report a 50% or greater improvement with low back pain for improved function of her trunk and QOL    Baseline: 0-10/10\            Goal status: INITIAL    PLAN:   PT FREQUENCY: 2x/week   PT DURATION: 6 weeks   PLANNED INTERVENTIONS: Therapeutic exercises, Therapeutic activity, Neuromuscular re-education, Patient/Family education, Self Care, Joint mobilization, Dry Needling, Electrical stimulation, Spinal manipulation, Spinal mobilization, Cryotherapy, Moist heat, Manual therapy, and  Re-evaluation   PLAN FOR NEXT SESSION: recheck ROM, goals . manual if tolerate, cont core and maybe DN to Rt QL? posture , consider quadruped progression  [employer] MS, PT 11/16/22 3:25 PM

## 2022-11-17 ENCOUNTER — Ambulatory Visit: Payer: Medicaid Other | Admitting: Physical Therapy

## 2022-11-17 NOTE — Therapy (Signed)
OUTPATIENT PHYSICAL THERAPY TREATMENT NOTE/Re-cert   Patient Name: [name] MRN: [id number] DOB:[birth year], 37 y.o., female Today's Date: 11/19/2022  PCP: PCP: Jerre Simon MD   REFERRING PROVIDER: REFERRING PROVIDER: Margart Sickles   END OF SESSION:   PT End of Session - 11/19/22 1556     Visit Number 9    Number of Visits 13    Date for PT Re-Evaluation 11/25/22    Authorization Type [employer]    Authorization - Visit Number 9    Authorization - Number of Visits 27    PT Start Time 1550    PT Stop Time 1635    PT Time Calculation (min) 45 min    Activity Tolerance Patient tolerated treatment well    Behavior During Therapy [facility] for tasks assessed/performed                 Past Medical History:  Diagnosis Date   Adenomyosis 2012   Planning hysterectomy    ADHD (attention deficit hyperactivity disorder)    NO MEDS   AKI (acute kidney injury) (HCC) 06/02/2015   Allergy 1988   Hay fever since birth    Asthma 1988   BV (bacterial vaginosis) 10/07/2018   Helicobacter pylori gastritis 04/15/2021   Lumbar spondylolysis    Pyelonephritis 06/02/2015   Sepsis secondary to UTI (HCC) 06/05/2015   Thyromegaly    H/O   Past Surgical History:  Procedure Laterality Date   ENDOMETRIAL ABLATION  2011   INCISE AND DRAIN ABCESS     surgical incision, wound produced mrsa   TUBAL LIGATION  2010   Patient Active Problem List   Diagnosis Date Noted   Helicobacter pylori gastritis 04/15/2021   ADHD (attention deficit hyperactivity disorder) 09/03/2012   Chronic abdominal pain 05/27/2012   Decreased vision 03/19/2012   Tobacco abuse 03/19/2012   Refusal of blood transfusions as patient is Jehovah's Witness 11/11/2011   Adenomyosis    Asthma     REFERRING DIAG: Cervicalgia R sided neck pain   THERAPY DIAG:  Cervicalgia  Abnormal posture  Other low back pain  Rationale for Evaluation and Treatment Rehabilitation  OBJECTIVE: (objective measures  completed at initial evaluation unless otherwise dated)  ONSET DATE: 09/23/22   SUBJECTIVE:                                                                                                                                                                                                          SUBJECTIVE STATEMENT: Pt reports no low back pain today. Pt wants to be  able to return to full time work, but is not sure how long she can tolerate standing. Pt's reports 75% improvement in her low back pain and function.  PAIN:  Are you having pain? Yes: NPRS scale: 0/10 Pain location: back bilateral  Pain description: sore, stabbing , tingling  Aggravating factors: standing, sitting Relieving factors: really nothing much  0-10/10 pain    Yes: NPRS scale: 0/10 Pain location: neck  Pain description: tight  Aggravating factors: not hurt  Relieving factors: meds  PERTINENT HISTORY:  Allergies  Chronic low back pain (spondylolisthesis?)    PRECAUTIONS: None   WEIGHT BEARING RESTRICTIONS: No   FALLS:  Has patient fallen in last 6 months? 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 [employer]    PLOF: Independent Son helps carrying items up and down the stairs    PATIENT GOALS: I want to be able to do normal daily activities with no problems    NEXT MD VISIT: unknown    OBJECTIVE: (objective measures completed at initial evaluation unless otherwise dated)   DIAGNOSTIC FINDINGS:  None available    PATIENT SURVEYS:  NDI 44% (22/50)   COGNITION: Overall cognitive status: Within functional limits for tasks assessed   SENSATION: WFL Numbness in Rt UE and Rt LE at times    POSTURE: rounded shoulders and  forward head; Increased lordosis   PALPATION: Pain with palpation to bilateral suboccipitals and posterior cervicals.                CERVICAL ROM:    Active ROM A/PROM (deg) eval  Flexion 30  Extension 55  Right lateral flexion 45  Left lateral flexion 35  Right rotation WFL twinge   Left rotation WFL tight on L    (Blank rows = not tested)   UPPER EXTREMITY ROM: WNL    Active ROM Right eval Left eval  Shoulder flexion      Shoulder extension      Shoulder abduction      Shoulder adduction      Shoulder extension      Shoulder internal rotation      Shoulder external rotation      Elbow flexion      Elbow extension      Wrist flexion      Wrist extension      Wrist ulnar deviation      Wrist radial deviation      Wrist pronation      Wrist supination       (Blank rows = not tested)   UPPER EXTREMITY MMT:   MMT Right eval Left eval  Shoulder flexion 4 4  Shoulder extension      Shoulder abduction 4 4  Shoulder adduction      Shoulder extension      Shoulder internal rotation      Shoulder external rotation      Middle trapezius      Lower trapezius      Elbow flexion 4+ 4+  Elbow extension 4+ 4+  Wrist flexion      Wrist extension      Wrist ulnar deviation      Wrist radial deviation  Wrist pronation      Wrist supination      Grip strength       (Blank rows = not tested)   CERVICAL SPECIAL TESTS:  NT   FUNCTIONAL TESTS:  NT on eval   MUSCLE LENGTH: Hamstrings: Right WNLs deg; Left WNLs deg  LUMBAR ROM:   Active  A/PROM  11/19/2022  Flexion Full, pulling low back, lordosis does not fully reverse  Extension Full  Right lateral flexion Full  Left lateral flexion Full  Right rotation Full  Left rotation Full   (Blank rows = not tested)  LE ROM: Grossly WNLs Active  Right 11/19/2022 Left 11/19/2022  Hip flexion    Hip extension    Hip abduction    Hip adduction    Hip internal rotation    Hip external rotation    Knee  flexion    Knee extension    Ankle dorsiflexion    Ankle plantarflexion    Ankle inversion    Ankle eversion     (Blank rows = not tested)  LE MMT: Myotome screen negative MMT Right 11/19/2022 Left 11/19/2022  Hip flexion    Hip extension    Hip abduction    Hip adduction    Hip internal rotation    Hip external rotation    Knee flexion    Knee extension    Ankle dorsiflexion    Ankle plantarflexion    Ankle inversion    Ankle eversion     (Blank rows = not tested)  LUMBAR SPECIAL TESTS:  Straight leg raise test: Negative, Slump test: Negative, SI Compression/distraction test: Negative, and FABER test: Negative  GAIT: Distance walked: 200' Assistive device utilized: None Level of assistance: Complete Independence Comments: WNLs    TODAY'S TREATMENT: [facility] Adult PT Treatment:                                                DATE: 11/19/22 Therapeutic Exercise: Supine 90/90 abdominal bracing x5 10" Supine dead bug 2x10 Forward planks x5 Q'ped alt arm lifts x10 each, alt hip ext x10 each, alt arms/legs x5 each Child's pose forward and laterally Hinged hip lifting Ed with dowel 2x8 Hinged hip lifting from 14" 15# x10  Therapeutic Activity: NDI reassessed and reviewed  [facility] Adult PT Treatment:                                                DATE: 11/12/22 Therapeutic Exercise: Supine 90/90 abdominal bracing x5 10" Supine 90/90 alt heel taps 3x10 Prone on 2 pillows alt leg ext x10 Prone on 2 pillows alt arm/leg lifts 2x10 Palloff press 2x10 double green band Child's pose forward and laterally  [facility] Adult PT Treatment:                                                DATE: 11/10/22 Therapeutic Exercise: UBE level 1 in reverse for 5 min  Row and extension using slastix  Palloff press x 10  with rotation x 10  Supine shoulder extension x 10 blue  Banded bridge x  10  Bridge with clam  x 15 unilateral LTR with knees crossed and without Spine rotation to L Quadruped lateral  trunk stretch  T spine thread the needle  Manual Therapy: Sidelying Rt QL stretch with light soft tissue work to [employer] and surrounding areas Joint mobs L3-L4-L5 Manual distraction Rt LE  STW to QL compression   MHP 10 min lumbar    [facility] Adult PT Treatment:                                                DATE: 11/05/22 Therapeutic Exercise: Nustep L5 UR/LE x 6 minutes Seated forward flexion forward and laterally c swiss ball Tra neutral spine via pelvic tilting: March, Bent knee fall out (with and without Blue band) 90/90 ab brace 5 sec x 5  Bridge x10 SKTC  LTR Q'ped hip extension 2x5 each   PATIENT EDUCATION:  Education details: HEP, POC , core    Person educated: Patient Education method: Explanation, Demonstration, and Handouts Education comprehension: verbalized understanding, returned demonstration, and needs further education   HOME EXERCISE PROGRAM: Access Code: [email or web address] URL: [email or web address] Date: 10/22/2022 Prepared by: Joellyn Rued  Exercises - Supine Cervical Retraction with Towel  - 2 x daily - 7 x weekly - 2 sets - 10 reps - 5 hold - Hooklying Transversus Abdominis Palpation  - 2 x daily - 7 x weekly - 2 sets - 10 reps - 5 hold - Supine Cervical Rotation AROM on Pillow  - 2 x daily - 7 x weekly - 2 sets - 10 reps - 5 hold - Hooklying Single Knee to Chest Stretch  - 2 x daily - 7 x weekly - 1 sets - 3 reps - 20 hold - Supine Double Knee to Chest  - 2 x daily - 7 x weekly - 1 sets - 3 reps - 20 hold - Supine Posterior Pelvic Tilt  - 2 x daily - 7 x weekly - 1 sets - 10 reps - 3 hold - Hooklying Clamshell with Resistance  - 2 x daily - 7 x weekly - 1 sets - 10 reps - 5 hold   ASSESSMENT:   CLINICAL IMPRESSION: PT was completed for core strengthening and education in hinged hip lifting. Pt need verbal cueing to maintain proper technique with hinged hip lifting. After completion, pt reported fatigue of her low back, but otherwise tolerated without  adverse effects. NDI was reassessed and found much, and pt's subjective report indicates good progress with low back pan and function. Pt will benefit from skilled PT 1w4 to address core strength and standing tolerance, and to provide with pt back care strategies to improve pt's confidence for return back to work.  OBJECTIVE IMPAIRMENTS: decreased activity tolerance, decreased mobility, difficulty walking, decreased ROM, decreased strength, increased fascial restrictions, increased muscle spasms, impaired UE functional use, postural dysfunction, obesity, and pain.    ACTIVITY LIMITATIONS: carrying, lifting, bending, sitting, standing, squatting, sleeping, stairs, bed mobility, hygiene/grooming, locomotion level, and caring for others   PARTICIPATION LIMITATIONS: interpersonal relationship, community activity, and occupation   PERSONAL FACTORS: Past/current experiences and 1-2 comorbidities: lumbar spondylolysis  are also affecting patient's functional outcome.    REHAB POTENTIAL: Good   CLINICAL DECISION MAKING: Evolving/moderate complexity   EVALUATION COMPLEXITY: Moderate     GOALS: Goals reviewed with patient? No     LONG TERM  GOALS: Target date: 12/26/2022     Pt will be I with HEP for cervical and lumbar stabilization, trunk flexibility  Baseline: unknown  Goal status: ongoing    2.  Pt will be able to sleep > 5 hours per night due to less pain overall  Baseline: 3-5 hrs 11/10/22: no longer limited by pain  Goal status: MET    3.  Pt will be able to demo normal cervical AROM in all planes  Baseline: limited in all planes with pain  Goal status: Ongoing   4.  Pt will be able to lift items to shoulder hgt with no increase in neck pain  Baseline: painful  Goal status: Ongoing   5. NDI score will improve to 34% to demo improved functional mobility.             Baseline: 44%  Status: 11/19/22=90%            Goal status: MET  6. Pt wil report a 50% or greater improvement with  low back pain for improved function of her trunk and QOL    Baseline: 0-10/10 Status: 11/19/22= 75% reported improvement in low back pain and function            Goal status: MET  7. Pt will be able to complete a forward plank from toes for 30" as indication of improved core strength  Baseline: 10"  Goal status: New as of 11/19/22  7. Pt will be to recall back care strategies to asist pt's confidence for return to work Baseline: Initiated 11/19/22 Goal status: New as of 11/19/22     PLAN:   PT FREQUENCY: 1x/week   PT DURATION: 4 weeks   PLANNED INTERVENTIONS: Therapeutic exercises, Therapeutic activity, Neuromuscular re-education, Patient/Family education, Self Care, Joint mobilization, Dry Needling, Electrical stimulation, Spinal manipulation, Spinal mobilization, Cryotherapy, Moist heat, Manual therapy, and Re-evaluation   PLAN FOR NEXT SESSION: recheck ROM, goals . manual if tolerate, cont core and maybe DN to Rt QL? posture , consider quadruped progression  [employer] MS, PT 11/19/22 5:28 PM

## 2022-11-19 ENCOUNTER — Ambulatory Visit: Payer: Medicaid Other

## 2022-11-19 DIAGNOSIS — M542 Cervicalgia: Secondary | ICD-10-CM | POA: Diagnosis not present

## 2022-11-19 DIAGNOSIS — R293 Abnormal posture: Secondary | ICD-10-CM

## 2022-11-19 DIAGNOSIS — M5459 Other low back pain: Secondary | ICD-10-CM

## 2022-12-02 NOTE — Therapy (Addendum)
 OUTPATIENT PHYSICAL THERAPY TREATMENT NOTE/Progress Note/DC   Patient Name: [name] MRN: [id number] DOB:10-04-1985, 37 y.o., female Today's Date: 12/03/2022  PCP: PCP: Rosendo Rush MD   REFERRING PROVIDER: REFERRING PROVIDER: Ottie Chew   Progress Note Reporting Period 10/14/22 to 12/03/22  See note below for Objective Data and Assessment of Progress/Goals.    END OF SESSION:   PT End of Session - 12/03/22 1425     Visit Number 10    Number of Visits 12    Date for PT Re-Evaluation 12/26/22    Authorization Type [employer]    Authorization - Visit Number 10    Authorization - Number of Visits 27    PT Start Time 1417    PT Stop Time 1500    PT Time Calculation (min) 43 min    Activity Tolerance Patient tolerated treatment well    Behavior During Therapy WFL for tasks assessed/performed                 Past Medical History:  Diagnosis Date   Adenomyosis 2012   Planning hysterectomy    ADHD (attention deficit hyperactivity disorder)    NO MEDS   AKI (acute kidney injury) (HCC) 06/02/2015   Allergy 1988   Hay fever since birth    Asthma 1988   BV (bacterial vaginosis) 10/07/2018   Helicobacter pylori gastritis 04/15/2021   Lumbar spondylolysis    Pyelonephritis 06/02/2015   Sepsis secondary to UTI (HCC) 06/05/2015   Thyromegaly    H/O   Past Surgical History:  Procedure Laterality Date   ENDOMETRIAL ABLATION  2011   INCISE AND DRAIN ABCESS     surgical incision, wound produced mrsa   TUBAL LIGATION  2010   Patient Active Problem List   Diagnosis Date Noted   Helicobacter pylori gastritis 04/15/2021   ADHD (attention deficit hyperactivity disorder) 09/03/2012   Chronic abdominal pain 05/27/2012   Decreased vision 03/19/2012   Tobacco abuse 03/19/2012   Refusal of blood transfusions as patient is Jehovah's Witness 11/11/2011   Adenomyosis    Asthma     REFERRING DIAG: Cervicalgia R sided neck pain   THERAPY DIAG:   Cervicalgia  Abnormal posture  Other low back pain  Rationale for Evaluation and Treatment Rehabilitation  OBJECTIVE: (objective measures completed at initial evaluation unless otherwise dated)  ONSET DATE: 09/23/22   SUBJECTIVE:  SUBJECTIVE STATEMENT: Pt reports one episode of increased pain after sitting in a meeting for 3 hours in hard chairs. Pt 's notes the pain resolved and completing her HEP was helpful  PAIN:  Are you having pain? Yes: NPRS scale: 0/10 Pain location: back bilateral  Pain description: sore, stabbing , tingling  Aggravating factors: standing, sitting Relieving factors: really nothing much  0-10/10 pain    Yes: NPRS scale: 0/10 Pain location: neck  Pain description: tight  Aggravating factors: not hurt  Relieving factors: meds  PERTINENT HISTORY:  Allergies  Chronic low back pain (spondylolisthesis?)    PRECAUTIONS: None   WEIGHT BEARING RESTRICTIONS: No   FALLS:  Has patient fallen in last 6 months? No   LIVING ENVIRONMENT: Lives with: lives with their family kids 52 , 66, 62  Lives in: House/apartment Stairs: Yes: Internal: 14 steps; on right going up and External: 4 steps; on right going up Has following equipment at home: None   OCCUPATION: Not currently working. 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 [employer]    PLOF: Independent Son helps carrying items up and down the stairs    PATIENT GOALS: I want to be able to do normal daily activities with no problems    NEXT MD VISIT: unknown    OBJECTIVE: (objective measures completed at initial evaluation unless otherwise dated)   DIAGNOSTIC FINDINGS:  None available    PATIENT SURVEYS:  NDI 44% (22/50)   COGNITION: Overall cognitive status: Within functional limits for  tasks assessed   SENSATION: WFL Numbness in Rt UE and Rt LE at times    POSTURE: rounded shoulders and forward head; Increased lordosis   PALPATION: Pain with palpation to bilateral suboccipitals and posterior cervicals.                CERVICAL ROM:    Active ROM A/PROM (deg) eval  Flexion 30  Extension 55  Right lateral flexion 45  Left lateral flexion 35  Right rotation WFL twinge   Left rotation WFL tight on L    (Blank rows = not tested)   UPPER EXTREMITY ROM: WNL    Active ROM Right eval Left eval  Shoulder flexion      Shoulder extension      Shoulder abduction      Shoulder adduction      Shoulder extension      Shoulder internal rotation      Shoulder external rotation      Elbow flexion      Elbow extension      Wrist flexion      Wrist extension      Wrist ulnar deviation      Wrist radial deviation      Wrist pronation      Wrist supination       (Blank rows = not tested)   UPPER EXTREMITY MMT:   MMT Right eval Left eval  Shoulder flexion 4 4  Shoulder extension      Shoulder abduction 4 4  Shoulder adduction      Shoulder extension      Shoulder internal rotation      Shoulder external rotation      Middle trapezius      Lower trapezius      Elbow flexion 4+ 4+  Elbow extension 4+ 4+  Wrist flexion      Wrist extension      Wrist ulnar deviation      Wrist radial deviation  Wrist pronation      Wrist supination      Grip strength       (Blank rows = not tested)   CERVICAL SPECIAL TESTS:  NT   FUNCTIONAL TESTS:  NT on eval   MUSCLE LENGTH: Hamstrings: Right WNLs deg; Left WNLs deg  LUMBAR ROM:   Active  A/PROM  12/03/2022  Flexion Full, pulling low back, lordosis does not fully reverse  Extension Full  Right lateral flexion Full  Left lateral flexion Full  Right rotation Full  Left rotation Full   (Blank rows = not tested)  LE ROM: Grossly WNLs Active  Right 12/03/2022 Left 12/03/2022  Hip flexion    Hip  extension    Hip abduction    Hip adduction    Hip internal rotation    Hip external rotation    Knee flexion    Knee extension    Ankle dorsiflexion    Ankle plantarflexion    Ankle inversion    Ankle eversion     (Blank rows = not tested)  LE MMT: Myotome screen negative MMT Right 12/03/2022 Left 12/03/2022  Hip flexion    Hip extension    Hip abduction    Hip adduction    Hip internal rotation    Hip external rotation    Knee flexion    Knee extension    Ankle dorsiflexion    Ankle plantarflexion    Ankle inversion    Ankle eversion     (Blank rows = not tested)  LUMBAR SPECIAL TESTS:  Straight leg raise test: Negative, Slump test: Negative, SI Compression/distraction test: Negative, and FABER test: Negative  GAIT: Distance walked: 200' Assistive device utilized: None Level of assistance: Complete Independence Comments: WNLs    TODAY'S TREATMENT: [facility] Adult PT Treatment:                                                DATE: 12/03/22 Therapeutic Exercise: Supine 90/90 abdominal bracing x5 10 Supine dead bug 2x10 Forward planks x5 10 Q'ped alt hip ext x10 each, alt arms/legs x5 each, bird dogs Child's pose forward and laterally Hinged hip lifting Ed with dowel 2x10 Palloff side steps x5 c x2 presses RTB  [facility] Adult PT Treatment:                                                DATE: 11/19/22 Therapeutic Exercise: Supine 90/90 abdominal bracing x5 10 Supine dead bug 2x10 Forward planks x5 Q'ped alt arm lifts x10 each, alt hip ext x10 each, alt arms/legs x5 each Child's pose forward and laterally Hinged hip lifting Ed with dowel 2x8 Hinged hip lifting from 14 15# x10  Therapeutic Activity: NDI reassessed and reviewed  [facility] Adult PT Treatment:                                                DATE: 11/12/22 Therapeutic Exercise: Supine 90/90 abdominal bracing x5 10 Supine 90/90 alt heel taps 3x10 Prone on 2 pillows alt leg ext x10 Prone on 2 pillows alt  arm/leg lifts  2x10 Palloff press 2x10 double green band Child's pose forward and laterally   PATIENT EDUCATION:  Education details: HEP, POC , core    Person educated: Patient Education method: Explanation, Demonstration, and Handouts Education comprehension: verbalized understanding, returned demonstration, and needs further education   HOME EXERCISE PROGRAM: Access Code: [email or web address] URL: [email or web address] Date: 10/22/2022 Prepared by: Dasie Daft  Exercises - Supine Cervical Retraction with Towel  - 2 x daily - 7 x weekly - 2 sets - 10 reps - 5 hold - Hooklying Transversus Abdominis Palpation  - 2 x daily - 7 x weekly - 2 sets - 10 reps - 5 hold - Supine Cervical Rotation AROM on Pillow  - 2 x daily - 7 x weekly - 2 sets - 10 reps - 5 hold - Hooklying Single Knee to Chest Stretch  - 2 x daily - 7 x weekly - 1 sets - 3 reps - 20 hold - Supine Double Knee to Chest  - 2 x daily - 7 x weekly - 1 sets - 3 reps - 20 hold - Supine Posterior Pelvic Tilt  - 2 x daily - 7 x weekly - 1 sets - 10 reps - 3 hold - Hooklying Clamshell with Resistance  - 2 x daily - 7 x weekly - 1 sets - 10 reps - 5 hold   ASSESSMENT:   CLINICAL IMPRESSION: Pt returns PT after 2 weeks. Her subjective report continues to be positive response to PT. With an increase in pain after prolong ed sitting of 3 hours. Pt used her HEP to help reduce her pain. Therex was completed for lumbopelvic flexibility and strengthening. Decreased trunk control was noted with bird dog ex. Pt tolerated PT today without adverse effects. Pt will continue to benefit from skilled PT to address impairments for improved function with less pain. Assess neck and UE function the next PT session.   OBJECTIVE IMPAIRMENTS: decreased activity tolerance, decreased mobility, difficulty walking, decreased ROM, decreased strength, increased fascial restrictions, increased muscle spasms, impaired UE functional use, postural dysfunction,  obesity, and pain.    ACTIVITY LIMITATIONS: carrying, lifting, bending, sitting, standing, squatting, sleeping, stairs, bed mobility, hygiene/grooming, locomotion level, and caring for others   PARTICIPATION LIMITATIONS: interpersonal relationship, community activity, and occupation   PERSONAL FACTORS: Past/current experiences and 1-2 comorbidities: lumbar spondylolysis  are also affecting patient's functional outcome.    REHAB POTENTIAL: Good   CLINICAL DECISION MAKING: Evolving/moderate complexity   EVALUATION COMPLEXITY: Moderate     GOALS: Goals reviewed with patient? No     LONG TERM GOALS: Target date: 12/26/2022     Pt will be I with HEP for cervical and lumbar stabilization, trunk flexibility  Baseline: unknown  Goal status: ongoing    2.  Pt will be able to sleep > 5 hours per night due to less pain overall  Baseline: 3-5 hrs 11/10/22: no longer limited by pain  Goal status: MET    3.  Pt will be able to demo normal cervical AROM in all planes  Baseline: limited in all planes with pain  Goal status: Ongoing   4.  Pt will be able to lift items to shoulder hgt with no increase in neck pain  Baseline: painful  Goal status: Ongoing   5. NDI score will improve to 34% to demo improved functional mobility.             Baseline: 44%  Status: 11/19/22=90%  Goal status: MET  6. Pt wil report a 50% or greater improvement with low back pain for improved function of her trunk and QOL    Baseline: 0-10/10 Status: 11/19/22= 75% reported improvement in low back pain and function            Goal status: MET  7. Pt will be able to complete a forward plank from toes for 30 as indication of improved core strength  Baseline: 10  Goal status: New as of 11/19/22  7. Pt will be to recall back care strategies to asist pt's confidence for return to work Baseline: Initiated 11/19/22 Goal status: New as of 11/19/22     PLAN:   PT FREQUENCY: 1x/week   PT DURATION: 4  weeks   PLANNED INTERVENTIONS: Therapeutic exercises, Therapeutic activity, Neuromuscular re-education, Patient/Family education, Self Care, Joint mobilization, Dry Needling, Electrical stimulation, Spinal manipulation, Spinal mobilization, Cryotherapy, Moist heat, Manual therapy, and Re-evaluation   PLAN FOR NEXT SESSION: recheck ROM, goals . manual if tolerate, cont core and maybe DN to Rt QL? posture , consider quadruped progression  [employer] MS, PT 12/03/22 3:02 PM  PHYSICAL THERAPY DISCHARGE SUMMARY  Visits from Start of Care: 10  Current functional level related to goals / functional outcomes: See clinical impression and PT goals    Remaining deficits: See clinical impression and PT goals    Education / Equipment: HEP/Pt Ed   Patient agrees to discharge. Patient goals were partially met. Patient is being discharged due to not returning since the last visit.  Pershing Skidmore MS, PT 04/19/24 11:33 AM

## 2022-12-03 ENCOUNTER — Ambulatory Visit: Payer: Medicaid Other

## 2022-12-03 DIAGNOSIS — R293 Abnormal posture: Secondary | ICD-10-CM

## 2022-12-03 DIAGNOSIS — M542 Cervicalgia: Secondary | ICD-10-CM | POA: Diagnosis not present

## 2022-12-03 DIAGNOSIS — M5459 Other low back pain: Secondary | ICD-10-CM

## 2022-12-17 ENCOUNTER — Ambulatory Visit: Payer: Medicaid Other | Attending: Physician Assistant

## 2022-12-23 ENCOUNTER — Telehealth: Payer: Self-pay

## 2022-12-23 ENCOUNTER — Ambulatory Visit: Payer: Medicaid Other

## 2023-04-10 ENCOUNTER — Emergency Department (HOSPITAL_COMMUNITY)
Admission: EM | Admit: 2023-04-10 | Discharge: 2023-04-10 | Discharge status: Against Medical Advice | Payer: Medicaid Other | Attending: Emergency Medicine | Admitting: Emergency Medicine

## 2023-04-10 ENCOUNTER — Other Ambulatory Visit: Payer: Self-pay

## 2023-04-10 ENCOUNTER — Emergency Department (HOSPITAL_COMMUNITY): Payer: Medicaid Other

## 2023-04-10 ENCOUNTER — Encounter (HOSPITAL_COMMUNITY): Payer: Self-pay | Admitting: Emergency Medicine

## 2023-04-10 ENCOUNTER — Emergency Department (HOSPITAL_BASED_OUTPATIENT_CLINIC_OR_DEPARTMENT_OTHER)
Admission: EM | Admit: 2023-04-10 | Discharge: 2023-04-10 | Disposition: A | Discharge status: Home / Self Care | Payer: Medicaid Other | Attending: Emergency Medicine | Admitting: Emergency Medicine

## 2023-04-10 ENCOUNTER — Encounter (HOSPITAL_BASED_OUTPATIENT_CLINIC_OR_DEPARTMENT_OTHER): Payer: Self-pay | Admitting: *Deleted

## 2023-04-10 DIAGNOSIS — B9789 Other viral agents as the cause of diseases classified elsewhere: Secondary | ICD-10-CM | POA: Insufficient documentation

## 2023-04-10 DIAGNOSIS — Z5321 Procedure and treatment not carried out due to patient leaving prior to being seen by health care provider: Secondary | ICD-10-CM | POA: Diagnosis not present

## 2023-04-10 DIAGNOSIS — R197 Diarrhea, unspecified: Secondary | ICD-10-CM | POA: Insufficient documentation

## 2023-04-10 DIAGNOSIS — Z1152 Encounter for screening for COVID-19: Secondary | ICD-10-CM | POA: Diagnosis not present

## 2023-04-10 DIAGNOSIS — J208 Acute bronchitis due to other specified organisms: Secondary | ICD-10-CM | POA: Insufficient documentation

## 2023-04-10 DIAGNOSIS — R110 Nausea: Secondary | ICD-10-CM | POA: Insufficient documentation

## 2023-04-10 DIAGNOSIS — A084 Viral intestinal infection, unspecified: Secondary | ICD-10-CM | POA: Diagnosis not present

## 2023-04-10 DIAGNOSIS — K529 Noninfective gastroenteritis and colitis, unspecified: Secondary | ICD-10-CM | POA: Insufficient documentation

## 2023-04-10 DIAGNOSIS — R059 Cough, unspecified: Secondary | ICD-10-CM | POA: Diagnosis present

## 2023-04-10 LAB — RESP PANEL BY RT-PCR (RSV, FLU A&B, COVID)  RVPGX2
Influenza A by PCR: NEGATIVE
Influenza B by PCR: NEGATIVE
Resp Syncytial Virus by PCR: NEGATIVE
SARS Coronavirus 2 by RT PCR: NEGATIVE

## 2023-04-10 MED ORDER — ONDANSETRON HCL 4 MG/2ML IJ SOLN: 4.0000 mg | Freq: Once | INTRAMUSCULAR | Status: DC

## 2023-04-10 MED ORDER — ONDANSETRON 4 MG PO TBDP
4.0000 mg | ORAL_TABLET | Freq: Once | ORAL | Status: AC
  Administered 2023-04-10: 4 mg via ORAL
  Filled 2023-04-10: qty 1

## 2023-04-10 MED ORDER — LOPERAMIDE HCL 2 MG PO CAPS
4.0000 mg | ORAL_CAPSULE | Freq: Once | ORAL | Status: AC
  Administered 2023-04-10: 4 mg via ORAL
  Filled 2023-04-10: qty 2

## 2023-04-10 MED ORDER — ONDANSETRON HCL 4 MG PO TABS: 4.0000 mg | ORAL_TABLET | Freq: Three times a day (TID) | ORAL | 0 refills | Status: DC | PRN

## 2023-04-10 MED ORDER — SODIUM CHLORIDE 0.9 % IV BOLUS: 1000.0000 mL | Freq: Once | INTRAVENOUS | Status: DC

## 2023-04-10 NOTE — ED Triage Notes (Signed)
Pt has been sick for 2 plus weeks with URI and is reporting congestion and "mucous in chest" as well as diarrhea for the past 6 days and vomiting for 3 days.  Pt vomited x2 in the last 24 hours and had  3 episodes of diarrhea daily.  Pt was seen at [facility] but left AMA after triage.  She had a swab and an xray.

## 2023-04-10 NOTE — Discharge Instructions (Signed)
You most likely had two viral infections back to back. You likely have lingering cough due to bronchitis. This will take time to recover especially with your history of asthma.   The vomiting and diarrhea is due to another virus causing gastroenteritis. Make sure to take frequent small sips of fluid to keep you hydrated. We are going to prescribe zofran (an anti nausea medication) and immodium for diarrhea. This should help you control the symptoms until you clear the viral infection.

## 2023-04-10 NOTE — ED Notes (Signed)
Pt asked about wait time was informed we are not sure. Pt walked out of ED AMA

## 2023-04-10 NOTE — ED Provider Notes (Signed)
[name] Provider Note   CSN: [id number] Arrival date & time: 04/10/23  2046     History  Chief Complaint  Patient presents with   Diarrhea    [name] is a 37 y.o. female.  Patient reports that she had URI symptoms starting 2 weeks ago. She feels much improved and just has mild lingering cough and feeling of "stuck mucous" in her chest. She has been taking sudafed and mucinex dm since two weeks prior. For the last 6 days patient has had at least 4 episodes of diarrhea per day with diarrhea waking her from sleep. She says as soon as she eats something she has diarrhea or vomiting. Vomiting started 3 days ago. Patient denies fever.  She says she has been able to stay hydrated by sipping gatorade.  She is mainly concerned because she lives with her older grandmother and is concerned about the diarrhea. Denies blood in stool. Denies recent travel. No recent antibiotic use.   The history is provided by the patient.  Diarrhea Associated symptoms: vomiting   Associated symptoms: no abdominal pain and no fever        Home Medications Prior to Admission medications   Medication Sig Start Date End Date Taking? Authorizing Provider  ondansetron (ZOFRAN) 4 MG tablet Take 1 tablet (4 mg total) by mouth every 8 (eight) hours as needed for nausea or vomiting. 04/10/23  Yes Lockie Mola, MD  cetirizine (ZYRTEC ALLERGY) 10 MG tablet Take 1 tablet (10 mg total) by mouth daily. 04/29/22   Rising, Lurena Joiner, PA-C  Fluticasone Propionate (FLONASE NA) Place 1 tablet into the nose as needed.    [provider]  omeprazole (PRILOSEC) 40 MG capsule Take 1 capsule (40 mg total) by mouth 2 (two) times daily for 14 days. 04/16/21 04/30/21  Iva Boop, MD      Allergies    Ciprofloxacin, Clindamycin/lincomycin, Penicillins, and Rocephin [ceftriaxone]    Review of Systems   Review of Systems  Constitutional:  Positive for fatigue.  Negative for fever.  HENT:  Positive for congestion and sinus pain.   Respiratory:  Negative for wheezing.   Cardiovascular:  Negative for chest pain.  Gastrointestinal:  Positive for diarrhea and vomiting. Negative for abdominal pain and blood in stool.    Physical Exam Updated Vital Signs BP 130/78   Pulse 80   Temp 98.9 F (37.2 C) (Oral)   Resp 20   SpO2 99%  Physical Exam Constitutional:      Appearance: She is not ill-appearing.  HENT:     Nose: Congestion present.     Mouth/Throat:     Mouth: Mucous membranes are moist.     Pharynx: No oropharyngeal exudate or posterior oropharyngeal erythema.  Cardiovascular:     Rate and Rhythm: Normal rate and regular rhythm.     Pulses: Normal pulses.     Heart sounds: Normal heart sounds.  Pulmonary:     Effort: Pulmonary effort is normal.     Breath sounds: Normal breath sounds.     Comments: Occasional cough  Abdominal:     General: Abdomen is flat. Bowel sounds are normal.     Palpations: Abdomen is soft.  Skin:    General: Skin is warm and dry.     Capillary Refill: Capillary refill takes less than 2 seconds.  Neurological:     Mental Status: She is alert.     ED Results / Procedures / Treatments  Labs No labs   EKG None  Radiology DG Chest 2 View  Result Date: 04/10/2023 CLINICAL DATA:  Cough with green/yellow mucus. EXAM: CHEST - 2 VIEW COMPARISON:  Radiograph 08/25/2017 FINDINGS: The cardiomediastinal contours are normal. The lungs are clear. Pulmonary vasculature is normal. No consolidation, pleural effusion, or pneumothorax. No acute osseous abnormalities are seen. IMPRESSION: No active cardiopulmonary disease. Electronically Signed   By: Narda Rutherford M.D.   On: 04/10/2023 18:59    Procedures Procedures  None  Medications Ordered in ED Medications  ondansetron (ZOFRAN-ODT) disintegrating tablet 4 mg (4 mg Oral Given 04/10/23 2249)  loperamide (IMODIUM) capsule 4 mg (4 mg Oral Given 04/10/23 2249)     ED Course/ Medical Decision Making/ A&P Clinical Course as of 04/10/23 2316  Fri Apr 10, 2023  2135 Influenza A positive [AS]    Clinical Course User Index [AS] Michelle Piper, PA-C                                 Medical Decision Making This patient presents to the ED for concern of vomiting and diarrhea, this involves an extensive number of treatment options, and is a complaint that carries with it a high risk of complications and morbidity.  The differential diagnosis includes gastroenteritis, IBS, partial obstruction.    Co morbidities that complicate the patient evaluation - asthma, allergies   My initial workup includes Review of previous notes and chest xray   Additional history obtained from: Nursing notes from this visit.  Patient had stable vital signs. Her respiratory swab was negative for COVID, flu. Lung and abdominal exam were not concerning. Given that patient appeared well hydrated and without evidence of complicated gastroenteritis (no blood in stool), discharged patient with zofran for nausea. She has immodium at home.   At this time there does not appear to be any evidence of an acute emergency medical condition and the patient appears stable for discharge with appropriate outpatient follow up. Diagnosis was discussed with patient who verbalizes understanding of care plan and is agreeable to discharge. I have discussed return precautions with patient who verbalizes understanding. Patient encouraged to follow-up with their PCP within one week. All questions answered.  Patient's case discussed with Dr. Silverio Lay who agrees with plan to discharge with follow-up.   Note: Portions of this report may have been transcribed using voice recognition software. Every effort was made to ensure accuracy; however, inadvertent computerized transcription errors may still be present.   Risk Prescription drug management.     Final Clinical Impression(s) / ED  Diagnoses Final diagnoses:  Gastroenteritis  Viral bronchitis    Rx / DC Orders ED Discharge Orders          Ordered    ondansetron (ZOFRAN) 4 MG tablet  Every 8 hours PRN        04/10/23 2258              Lockie Mola, MD 04/10/23 2332    Charlynne Pander, MD 04/11/23 2325

## 2023-04-10 NOTE — ED Triage Notes (Signed)
Patient arrives ambulatory by POV reports having sinus infection about 3 weeks ago and now having cold symptoms. Reports cough with yellow/ green mucus. Onset of Saturday having diarrhea and nausea.

## 2023-05-11 IMAGING — US US ABDOMEN LIMITED
1 series · 15 of 25 positions shown · non-contrast
Comparison: Renal stone protocol CT 05/30/2015

CLINICAL DATA: Epigastric pain

EXAM:
ULTRASOUND ABDOMEN LIMITED RIGHT UPPER QUADRANT

[Series 1: us abdomen limited ruq mc & wl · 15 of 50 slices shown]
[im 1/50]
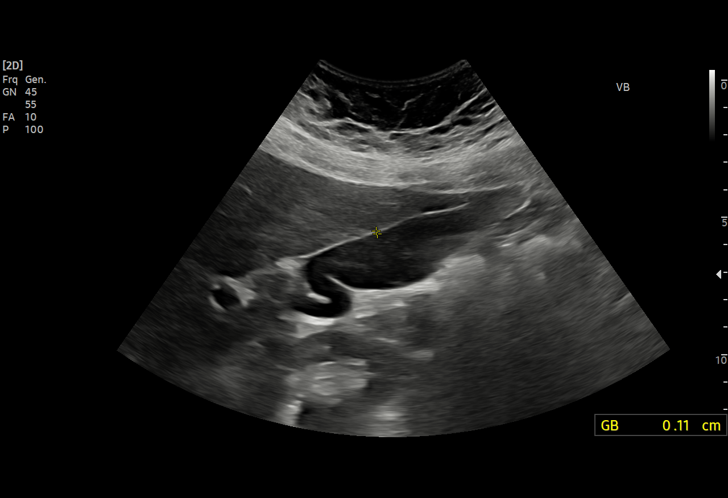
[im 5/50]
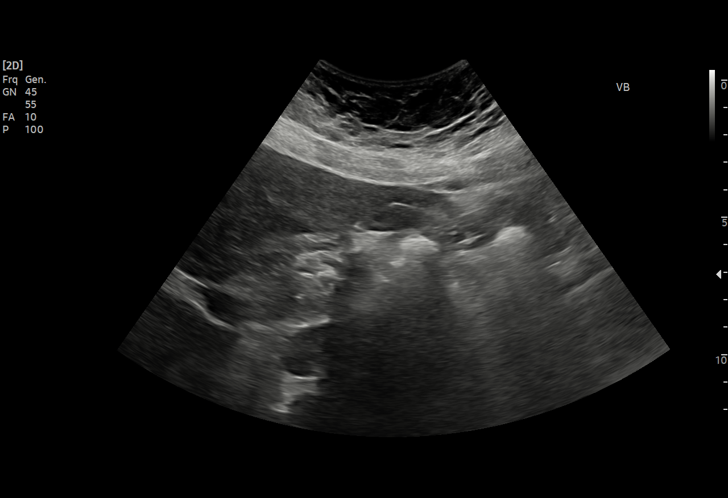
[im 9/50]
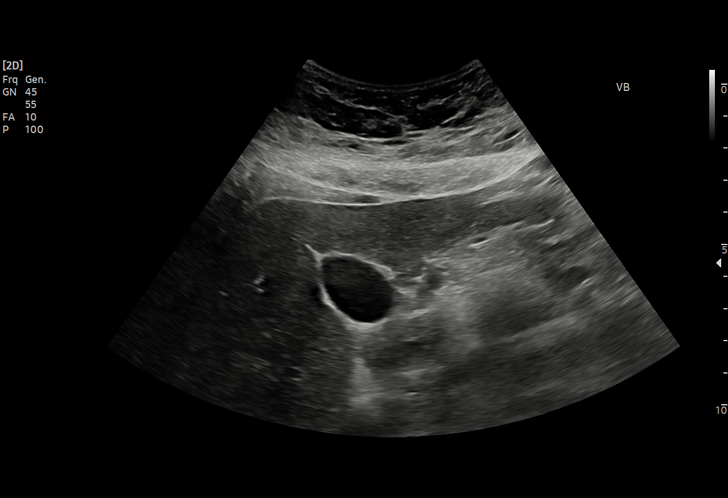
[im 11/50]
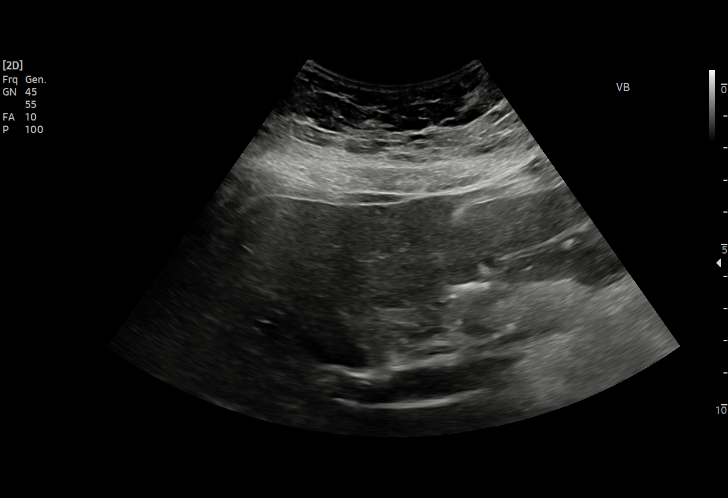
[im 15/50]
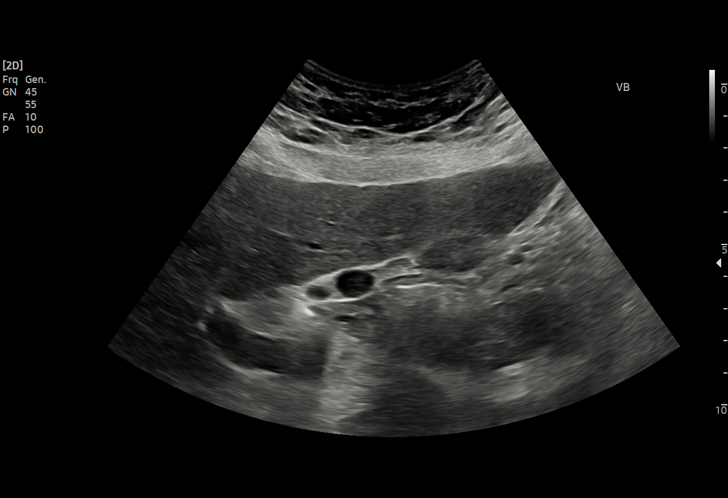
[im 19/50]
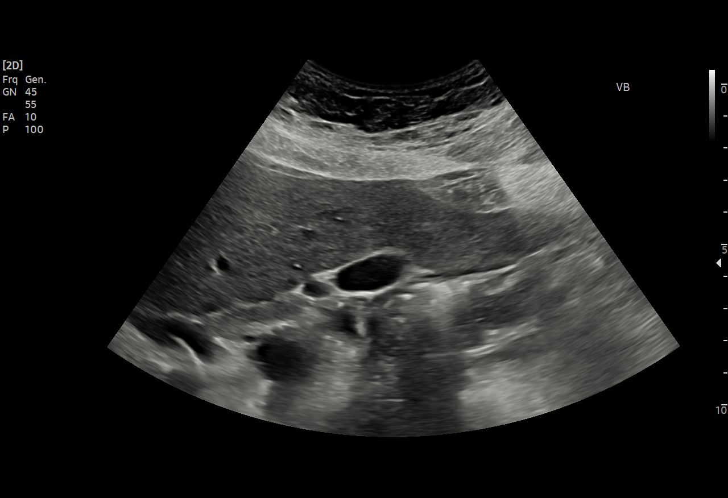
[im 21/50]
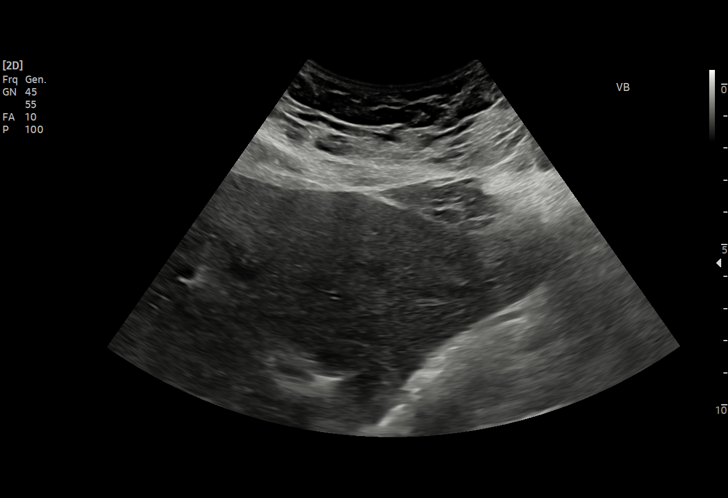
[im 25/50]
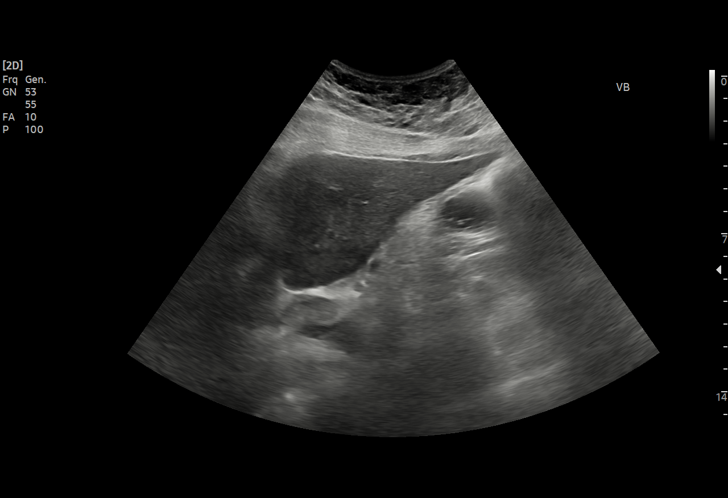
[im 29/50]
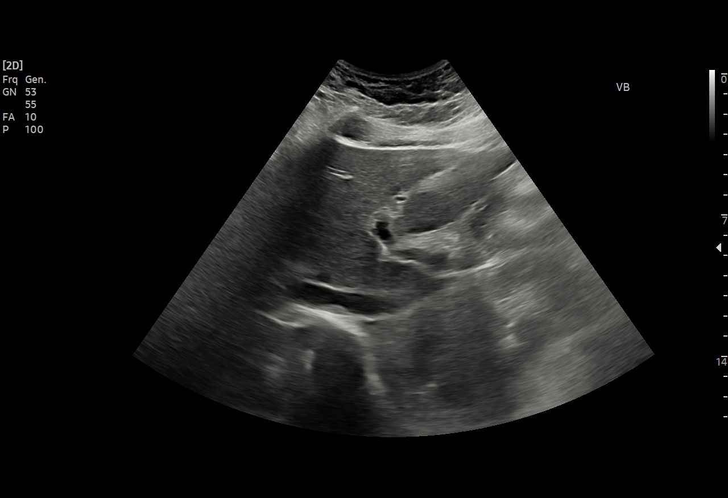
[im 31/50]
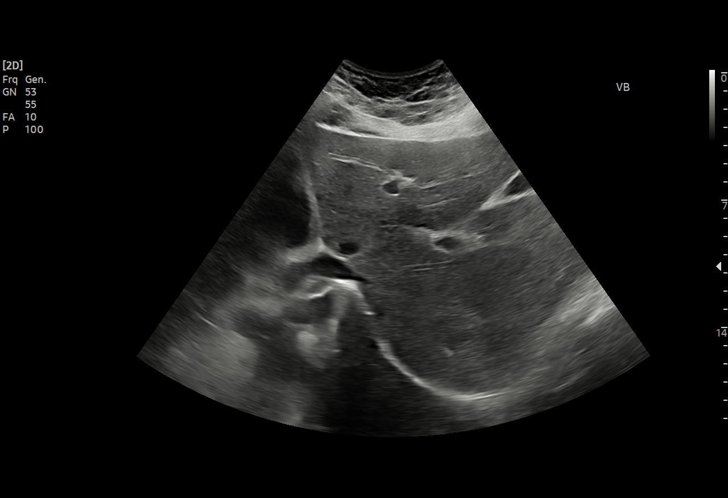
[im 35/50]
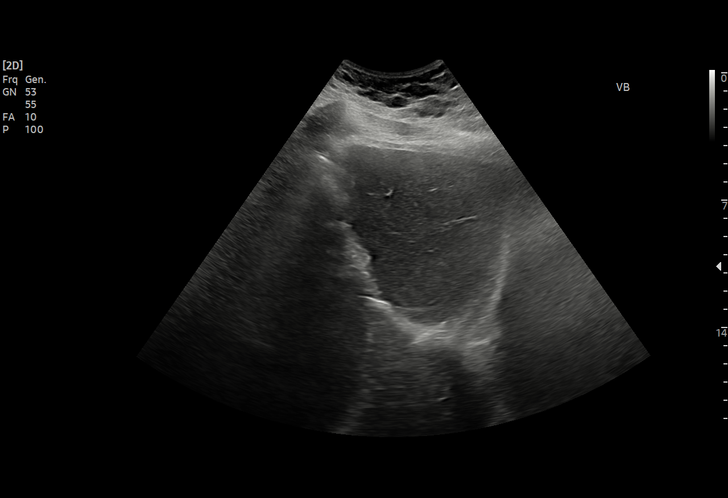
[im 39/50]
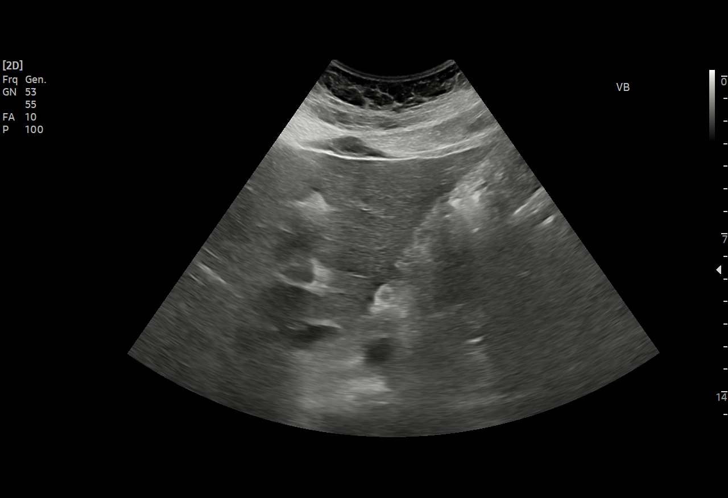
[im 41/50]
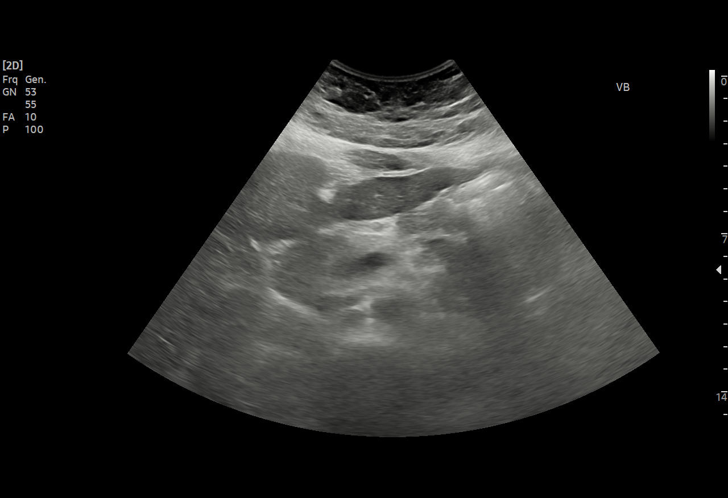
[im 45/50]
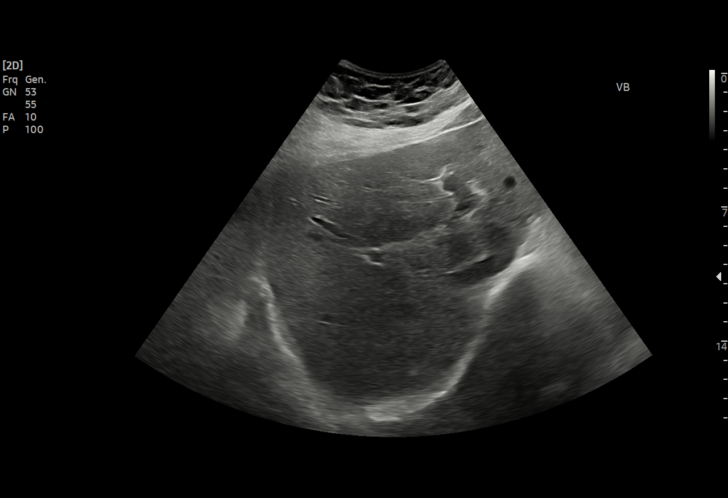
[im 50/50]
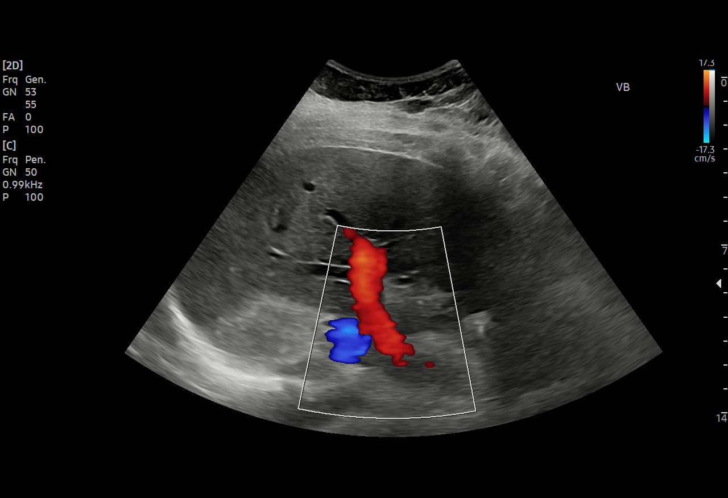

[15 of 25 positions shown; findings below may reference images not displayed]

FINDINGS: Gallbladder:

No gallstones or wall thickening visualized. No sonographic Murphy
sign noted by sonographer.

Common bile duct:

Diameter: 5 mm

Liver:

No focal lesion identified. Within normal limits in parenchymal
echogenicity. Portal vein is patent on color Doppler imaging with
normal direction of blood flow towards the liver.

Other: None.
IMPRESSION: No significant sonographic abnormality of the liver or gallbladder

## 2023-05-29 ENCOUNTER — Other Ambulatory Visit: Payer: Self-pay

## 2023-05-29 ENCOUNTER — Encounter: Payer: Self-pay | Admitting: Emergency Medicine

## 2023-05-29 ENCOUNTER — Ambulatory Visit
Admission: EM | Admit: 2023-05-29 | Discharge: 2023-05-29 | Disposition: A | Discharge status: Home / Self Care | Payer: Medicaid Other | Attending: Physician Assistant | Admitting: Physician Assistant

## 2023-05-29 DIAGNOSIS — J069 Acute upper respiratory infection, unspecified: Secondary | ICD-10-CM | POA: Diagnosis not present

## 2023-05-29 LAB — POCT INFLUENZA A/B
Influenza A, POC: NEGATIVE
Influenza B, POC: NEGATIVE

## 2023-05-29 LAB — POCT RAPID STREP A (OFFICE): Rapid Strep A Screen: NEGATIVE

## 2023-05-29 NOTE — ED Provider Notes (Signed)
[facility]    CSN: [id number] Arrival date & time: 05/29/23  1142      History   Chief Complaint Chief Complaint  Patient presents with   Sore Throat   Fatigue    HPI [name] is a 37 y.o. female.   Patient here today for evaluation of nasal congestion, fatigue, sore throat that started today.  She has not had any fevers or chills.  She has not had any vomiting or diarrhea.  She reports she thinks her symptoms may be related to allergies.  She has not taken any medication for her symptoms.  The history is provided by the patient.  Sore Throat Pertinent negatives include no abdominal pain and no shortness of breath.    Past Medical History:  Diagnosis Date   Adenomyosis 2012   Planning hysterectomy    ADHD (attention deficit hyperactivity disorder)    NO MEDS   AKI (acute kidney injury) (HCC) 06/02/2015   Allergy 1988   Hay fever since birth    Asthma 1988   BV (bacterial vaginosis) 10/07/2018   Helicobacter pylori gastritis 04/15/2021   Lumbar spondylolysis    Pyelonephritis 06/02/2015   Sepsis secondary to UTI (HCC) 06/05/2015   Thyromegaly    H/O    Patient Active Problem List   Diagnosis Date Noted   Helicobacter pylori gastritis 04/15/2021   ADHD (attention deficit hyperactivity disorder) 09/03/2012   Chronic abdominal pain 05/27/2012   Decreased vision 03/19/2012   Tobacco abuse 03/19/2012   Refusal of blood transfusions as patient is Jehovah's Witness 11/11/2011   Adenomyosis    Eczema 09/27/2010   Eustachian tube dysfunction 09/27/2010   Female genital symptoms 09/27/2010   Endometriosis of uterus 09/27/2010   Allergic rhinitis 08/02/2010   Asthma 08/01/2010    Past Surgical History:  Procedure Laterality Date   ENDOMETRIAL ABLATION  2011   INCISE AND DRAIN ABCESS     surgical incision, wound produced mrsa   TUBAL LIGATION  2010    OB History     Gravida  3   Para  3   Term  3   Preterm      AB      Living   3      SAB      IAB      Ectopic      Multiple      Live Births  3            Home Medications    Prior to Admission medications   Medication Sig Start Date End Date Taking? Authorizing Provider  cetirizine (ZYRTEC ALLERGY) 10 MG tablet Take 1 tablet (10 mg total) by mouth daily. 04/29/22   Rising, Lurena Joiner, PA-C  Fluticasone Propionate (FLONASE NA) Place 1 tablet into the nose as needed.    [provider]  omeprazole (PRILOSEC) 40 MG capsule Take 1 capsule (40 mg total) by mouth 2 (two) times daily for 14 days. Patient not taking: Reported on 05/29/2023 04/16/21 04/30/21  Iva Boop, MD  ondansetron (ZOFRAN) 4 MG tablet Take 1 tablet (4 mg total) by mouth every 8 (eight) hours as needed for nausea or vomiting. 04/10/23   Lockie Mola, MD    Family History Family History  Problem Relation Age of Onset   Alzheimer's disease Maternal Grandfather    Depression Sister    Depression Mother    Diabetes Father    Ovarian cancer Other        maternal great  grandmother   Uterine cancer Other        maternal great aunt   Testicular cancer Cousin        maternal cousin    Social History Social History   Tobacco Use   Smoking status: Some Days    Current packs/day: 0.25    Types: Cigarettes   Smokeless tobacco: Never  Vaping Use   Vaping status: Never Used  Substance Use Topics   Alcohol use: Yes    Alcohol/week: 1.0 standard drink of alcohol    Types: 1 Cans of beer per week    Comment: occasional   Drug use: No     Allergies   Penicillins, Ciprofloxacin, Clindamycin/lincomycin, and Rocephin [ceftriaxone]   Review of Systems Review of Systems  Constitutional:  Negative for chills and fever.  HENT:  Positive for congestion and sore throat. Negative for ear pain.   Eyes:  Negative for discharge and redness.  Respiratory:  Positive for cough. Negative for shortness of breath and wheezing.   Gastrointestinal:  Negative for abdominal pain,  diarrhea, nausea and vomiting.     Physical Exam Triage Vital Signs ED Triage Vitals  Encounter Vitals Group     BP 05/29/23 1331 136/77     Systolic BP Percentile --      Diastolic BP Percentile --      Pulse Rate 05/29/23 1331 85     Resp 05/29/23 1331 20     Temp 05/29/23 1331 98.2 F (36.8 C)     Temp Source 05/29/23 1331 Oral     SpO2 05/29/23 1331 96 %     Weight --      Height --      Head Circumference --      Peak Flow --      Pain Score 05/29/23 1334 10     Pain Loc --      Pain Education --      Exclude from Growth Chart --    No data found.  Updated Vital Signs BP 136/77 (BP Location: Left Arm)   Pulse 85   Temp 98.2 F (36.8 C) (Oral)   Resp 20   SpO2 96%   Visual Acuity Right Eye Distance:   Left Eye Distance:   Bilateral Distance:    Right Eye Near:   Left Eye Near:    Bilateral Near:     Physical Exam Vitals and nursing note reviewed.  Constitutional:      General: She is not in acute distress.    Appearance: Normal appearance. She is not ill-appearing.  HENT:     Head: Normocephalic and atraumatic.     Nose: Congestion present.     Mouth/Throat:     Mouth: Mucous membranes are moist.     Pharynx: No oropharyngeal exudate or posterior oropharyngeal erythema.  Eyes:     Conjunctiva/sclera: Conjunctivae normal.  Cardiovascular:     Rate and Rhythm: Normal rate and regular rhythm.     Heart sounds: Normal heart sounds. No murmur heard. Pulmonary:     Effort: Pulmonary effort is normal. No respiratory distress.     Breath sounds: Normal breath sounds. No wheezing, rhonchi or rales.  Skin:    General: Skin is warm and dry.  Neurological:     Mental Status: She is alert.  Psychiatric:        Mood and Affect: Mood normal.        Thought Content: Thought content normal.  UC Treatments / Results  Labs (all labs ordered are listed, but only abnormal results are displayed) Labs Reviewed  POCT RAPID STREP A (OFFICE) - Normal   POCT INFLUENZA A/B - Normal  SARS CORONAVIRUS 2 (TAT 6-24 HRS)    EKG   Radiology No results found.  Procedures Procedures (including critical care time)  Medications Ordered in UC Medications - No data to display  Initial Impression / Assessment and Plan / UC Course  I have reviewed the triage vital signs and the nursing notes.  Pertinent labs & imaging results that were available during my care of the patient were reviewed by me and considered in my medical decision making (see chart for details).    Suspect likely viral etiology of symptoms or possible allergies per patient.  Recommended daily over-the-counter allergy med and follow-up if symptoms do not improve or worsen.  Point-of-care strep and flu screening negative.  Will order COVID screening as well.  Final Clinical Impressions(s) / UC Diagnoses   Final diagnoses:  Acute upper respiratory infection   Discharge Instructions   None    ED Prescriptions   None    PDMP not reviewed this encounter.   Tomi Bamberger, PA-C 05/29/23 1615

## 2023-05-29 NOTE — ED Triage Notes (Signed)
Pt reports sore throat, nasal congestion, and fatigue x1 day. Denies fevers and chills at home.

## 2023-05-30 LAB — SARS CORONAVIRUS 2 (TAT 6-24 HRS): SARS Coronavirus 2: NEGATIVE

## 2023-06-28 ENCOUNTER — Emergency Department (HOSPITAL_COMMUNITY): Payer: Medicaid Other

## 2023-06-28 ENCOUNTER — Emergency Department (HOSPITAL_COMMUNITY)
Admission: EM | Admit: 2023-06-28 | Discharge: 2023-06-28 | Disposition: A | Discharge status: Home / Self Care | Payer: Medicaid Other | Attending: Emergency Medicine | Admitting: Emergency Medicine

## 2023-06-28 ENCOUNTER — Other Ambulatory Visit: Payer: Self-pay

## 2023-06-28 ENCOUNTER — Encounter (HOSPITAL_COMMUNITY): Payer: Self-pay | Admitting: Emergency Medicine

## 2023-06-28 DIAGNOSIS — J40 Bronchitis, not specified as acute or chronic: Secondary | ICD-10-CM | POA: Diagnosis not present

## 2023-06-28 DIAGNOSIS — Z20822 Contact with and (suspected) exposure to covid-19: Secondary | ICD-10-CM | POA: Insufficient documentation

## 2023-06-28 DIAGNOSIS — J45901 Unspecified asthma with (acute) exacerbation: Secondary | ICD-10-CM | POA: Diagnosis not present

## 2023-06-28 DIAGNOSIS — J45909 Unspecified asthma, uncomplicated: Secondary | ICD-10-CM | POA: Diagnosis not present

## 2023-06-28 DIAGNOSIS — R0602 Shortness of breath: Secondary | ICD-10-CM | POA: Diagnosis not present

## 2023-06-28 DIAGNOSIS — E876 Hypokalemia: Secondary | ICD-10-CM | POA: Insufficient documentation

## 2023-06-28 LAB — HCG, SERUM, QUALITATIVE: Preg, Serum: NEGATIVE

## 2023-06-28 LAB — COMPREHENSIVE METABOLIC PANEL
ALT: 17 U/L (ref 0–44)
AST: 16 U/L (ref 15–41)
Albumin: 3.7 g/dL (ref 3.5–5.0)
Alkaline Phosphatase: 68 U/L (ref 38–126)
Anion gap: 6 (ref 5–15)
BUN: 12 mg/dL (ref 6–20)
CO2: 24 mmol/L (ref 22–32)
Calcium: 8.5 mg/dL — ABNORMAL LOW (ref 8.9–10.3)
Chloride: 105 mmol/L (ref 98–111)
Creatinine, Ser: 0.69 mg/dL (ref 0.44–1.00)
GFR, Estimated: >60 mL/min (ref >60)
Glucose, Bld: 95 mg/dL (ref 70–99)
Potassium: 3.2 mmol/L — ABNORMAL LOW (ref 3.5–5.1)
Sodium: 135 mmol/L (ref 135–145)
Total Bilirubin: 0.4 mg/dL (ref 0.0–1.2)
Total Protein: 6.9 g/dL (ref 6.5–8.1)

## 2023-06-28 LAB — CBC
HCT: 39.1 % (ref 36.0–46.0)
Hemoglobin: 12.9 g/dL (ref 12.0–15.0)
MCH: 31.9 pg (ref 26.0–34.0)
MCHC: 33 g/dL (ref 30.0–36.0)
MCV: 96.5 fL (ref 80.0–100.0)
Platelets: 375 10*3/uL (ref 150–400)
RBC: 4.05 MIL/uL (ref 3.87–5.11)
RDW: 13 % (ref 11.5–15.5)
WBC: 12.8 10*3/uL — ABNORMAL HIGH (ref 4.0–10.5)
nRBC: 0 % (ref 0.0–0.2)

## 2023-06-28 LAB — RESP PANEL BY RT-PCR (RSV, FLU A&B, COVID)  RVPGX2
Influenza A by PCR: NEGATIVE
Influenza B by PCR: NEGATIVE
Resp Syncytial Virus by PCR: NEGATIVE
SARS Coronavirus 2 by RT PCR: NEGATIVE

## 2023-06-28 LAB — TROPONIN I (HIGH SENSITIVITY): Troponin I (High Sensitivity): <2 ng/L (ref <18)

## 2023-06-28 LAB — D-DIMER, QUANTITATIVE: D-Dimer, Quant: <0.27 ug{FEU}/mL (ref 0.00–0.50)

## 2023-06-28 MED ORDER — IPRATROPIUM-ALBUTEROL 0.5-2.5 (3) MG/3ML IN SOLN
RESPIRATORY_TRACT | Status: AC
  Administered 2023-06-28: 3 mL via RESPIRATORY_TRACT
  Filled 2023-06-28: qty 3

## 2023-06-28 MED ORDER — PREDNISONE 20 MG PO TABS: 40.0000 mg | ORAL_TABLET | Freq: Every day | ORAL | 0 refills | Status: DC

## 2023-06-28 MED ORDER — VENTOLIN HFA 108 (90 BASE) MCG/ACT IN AERS: 1.0000 | INHALATION_SPRAY | RESPIRATORY_TRACT | 0 refills | Status: DC | PRN

## 2023-06-28 MED ORDER — ALBUTEROL SULFATE HFA 108 (90 BASE) MCG/ACT IN AERS: 1.0000 | INHALATION_SPRAY | RESPIRATORY_TRACT | 0 refills | Status: DC | PRN

## 2023-06-28 MED ORDER — POTASSIUM CHLORIDE CRYS ER 20 MEQ PO TBCR
40.0000 meq | EXTENDED_RELEASE_TABLET | Freq: Once | ORAL | Status: AC
  Administered 2023-06-28: 40 meq via ORAL
  Filled 2023-06-28: qty 2

## 2023-06-28 MED ORDER — IPRATROPIUM-ALBUTEROL 0.5-2.5 (3) MG/3ML IN SOLN: 3.0000 mL | Freq: Once | RESPIRATORY_TRACT | Status: AC

## 2023-06-28 MED ORDER — PREDNISONE 20 MG PO TABS: 40.0000 mg | ORAL_TABLET | Freq: Every day | ORAL | 0 refills | Status: AC

## 2023-06-28 NOTE — ED Triage Notes (Addendum)
Pt reports intermittent "asthma attacks" this week, woke up at 0400 this am feeling short of breath, triage O2 99%, pt reports using home inhaler with no relief

## 2023-06-28 NOTE — ED Provider Notes (Signed)
[place] EMERGENCY DEPARTMENT AT [facility] Provider Note   CSN: [id number] Arrival date & time: 06/28/23  4098     History Chief Complaint  Patient presents with   Shortness of Breath    [name] is a 38 y.o. female with history of childhood asthma presents emerged from today for evaluation of shortness of breath intermittent for the past week.  She reports she has had 4 episodes with the last episode being shortly prior to arrival.  She reports he is having some runny nose and nasal congestion hide this is more of a chronic issue to her due to her seasonal allergies.  Reports was just having a dry cough.  Denies any fevers or chills.  No known sick contacts.  She denies any chest pain or chest tightness.  She reports that she has been using an older inhaler that she thinks may be expired.  She reports that after using the inhaler she does feel mildly better.  She even used her stepdaughter's nebulizer once.  She reports that she is usually sleeping or at rest when the shortness of breath episodes happen.  There is no exertional component to this.  She take Xyzal for seasonal allergies.  Allergic to penicillin.  She is a daily tobacco user.  Denies any EtOH or illicit drug use.   Shortness of Breath Associated symptoms: cough   Associated symptoms: no abdominal pain, no chest pain, no fever, no sore throat and no vomiting        Home Medications Prior to Admission medications   Medication Sig Start Date End Date Taking? Authorizing Provider  cetirizine (ZYRTEC ALLERGY) 10 MG tablet Take 1 tablet (10 mg total) by mouth daily. 04/29/22   Rising, Lurena Joiner, PA-C  Fluticasone Propionate (FLONASE NA) Place 1 tablet into the nose as needed.    [provider]  omeprazole (PRILOSEC) 40 MG capsule Take 1 capsule (40 mg total) by mouth 2 (two) times daily for 14 days. Patient not taking: Reported on 05/29/2023 04/16/21 04/30/21  Iva Boop, MD  ondansetron  (ZOFRAN) 4 MG tablet Take 1 tablet (4 mg total) by mouth every 8 (eight) hours as needed for nausea or vomiting. 04/10/23   Lockie Mola, MD      Allergies    Penicillins, Ciprofloxacin, Clindamycin/lincomycin, and Rocephin [ceftriaxone]    Review of Systems   Review of Systems  Constitutional:  Negative for chills and fever.  HENT:  Positive for congestion and rhinorrhea. Negative for sore throat.   Respiratory:  Positive for cough and shortness of breath.   Cardiovascular:  Negative for chest pain.  Gastrointestinal:  Negative for abdominal pain, nausea and vomiting.    Physical Exam Updated Vital Signs BP 128/70   Pulse (!) 102   Temp 98.6 F (37 C) (Oral)   Resp (!) 22   Ht 5\' 4"  (1.626 m)   Wt 86.2 kg   SpO2 100%   BMI 32.61 kg/m  Physical Exam Vitals and nursing note reviewed.  Constitutional:      General: She is not in acute distress.    Appearance: She is not ill-appearing or toxic-appearing.     Comments: On phone in no acute distress.   HENT:     Head: Normocephalic and atraumatic.     Nose:     Comments: Bilateral nasal turbinate edema and erythema with scant clear nasal discharge.    Mouth/Throat:     Mouth: Mucous membranes are moist.  Comments: No pharyngeal erythema, exudate, or edema noted.  Uvula midline.  Airway patent.  Moist mucous membranes. Eyes:     General: No scleral icterus.    Conjunctiva/sclera: Conjunctivae normal.  Cardiovascular:     Rate and Rhythm: Normal rate.  Pulmonary:     Effort: Pulmonary effort is normal. No respiratory distress.     Breath sounds: Normal breath sounds.  Abdominal:     General: Abdomen is flat.  Musculoskeletal:        General: No deformity.     Cervical back: Normal range of motion.  Skin:    General: Skin is warm and dry.  Neurological:     General: No focal deficit present.     Mental Status: She is alert.     ED Results / Procedures / Treatments   Labs (all labs ordered are listed, but  only abnormal results are displayed) Labs Reviewed  CBC - Abnormal; Notable for the following components:      Result Value   WBC 12.8 (*)    All other components within normal limits  RESP PANEL BY RT-PCR (RSV, FLU A&B, COVID)  RVPGX2  HCG, SERUM, QUALITATIVE  D-DIMER, QUANTITATIVE  COMPREHENSIVE METABOLIC PANEL  TROPONIN I (HIGH SENSITIVITY)    EKG EKG Interpretation Date/Time:  Sunday June 28 2023 04:05:00 EST Ventricular Rate:  90 PR Interval:  190 QRS Duration:  88 QT Interval:  343 QTC Calculation: 420 R Axis:   23  Text Interpretation: Sinus rhythm Probable anteroseptal infarct, recent Unchanged from prior 2022 tracing Confirmed by Alona Bene [phone]) on 06/28/2023 4:07:35 AM  Radiology DG Chest Port 1 View Result Date: 06/28/2023 CLINICAL DATA:  Intermittent asthma attack for a week EXAM: PORTABLE CHEST 1 VIEW COMPARISON:  04/10/2023 FINDINGS: Normal heart size and mediastinal contours. No acute infiltrate or edema. No effusion or pneumothorax. No acute osseous findings. IMPRESSION: No active disease. Electronically Signed   By: Tiburcio Pea M.D.   On: 06/28/2023 04:26    Procedures Procedures   Medications Ordered in ED Medications  ipratropium-albuterol (DUONEB) 0.5-2.5 (3) MG/3ML nebulizer solution 3 mL (3 mLs Nebulization Given 06/28/23 0431)    ED Course/ Medical Decision Making/ A&P                               Medical Decision Making Amount and/or Complexity of Data Reviewed Labs: ordered. Radiology: ordered.  Risk Prescription drug management.   38 y.o. female presents to the ER for evaluation of cough and cold symptoms of shortness of breath. Differential diagnosis includes but is not limited to CHF, pericardial effusion/tamponade, arrhythmias, ACS, COPD, asthma, bronchitis, pneumonia, pneumothorax, PE, anemia. Vital signs mild tachycardia at 102, mild kidney and 22, satting 100% on room air. Physical exam as noted above.   DuoNeb was given  prior to assessment.  Patient reports significant improvement upon DuoNeb.  Nursing reports that she is having some wheezing before hand.  Heart rates now 96, still satting 98 to 100% on room air.  She does not appear tachypneic and is speaking in full sentences.  Does sound to have some pretty significant nasal congestion.  Her lungs are clear throughout.  No accessory muscle use.  Given the patient's slight tachycardia, cannot make her PERC negative so have added on a D-dimer.  I independently reviewed and interpreted the patient's labs.  CBC does show slight elevation white blood cell count of 12.8.  No anemia.  D-dimer undetectable.  hCG is negative.  Troponin <2.  CMP potassium 3.2, calcium 8.5, otherwise no other electrolyte or LFT abnormality.  Respiratory panel negative.  Chest x-ray shows no active disease. Per radiologist's interpretation.    EKG reviewed and interpreted by my attending and read as Sinus rhythm Probable anteroseptal infarct, recent Unchanged from prior 2022.  Patient may be having intermittent asthma exacerbations.  She reports that she has had childhood asthma however the past 2 months has noticed has been worsening.  She is been using an old inhaler and her daughter's nebulizer.  Will send her home with albuterol inhaler and prednisone burst to see if this helps her with some bronchitis.  I recommended she follow with her primary care doctor she may need to be on a daily maintenance inhaler instead of an emergent relief inhaler.  I will decision for any PE given D-dimer is undetectable.  Not consistent with any ACS.  She does not appear volume overloaded.  I doubt CHF.  Chest x-ray not consistent with any pneumothorax or pneumonia.  We discussed the results of the labs/imaging. The plan is take medications as prescribed, follow up with PCP. We discussed strict return precautions and red flag symptoms. The patient verbalized their understanding and agrees to the plan. The  patient is stable and being discharged home in good condition.  Portions of this report may have been transcribed using voice recognition software. Every effort was made to ensure accuracy; however, inadvertent computerized transcription errors may be present.   Final Clinical Impression(s) / ED Diagnoses Final diagnoses:  Hypokalemia  Bronchitis    Rx / DC Orders ED Discharge Orders          Ordered    predniSONE (DELTASONE) 20 MG tablet  Daily,   Status:  Discontinued        06/28/23 0644    albuterol (VENTOLIN HFA) 108 (90 Base) MCG/ACT inhaler  Every 4 hours PRN,   Status:  Discontinued        06/28/23 0644    predniSONE (DELTASONE) 20 MG tablet  Daily        06/28/23 0645    albuterol (VENTOLIN HFA) 108 (90 Base) MCG/ACT inhaler  Every 4 hours PRN        06/28/23 0645              Achille Rich, PA-C 06/28/23 4098    Long, Arlyss Repress, MD 07/02/23 [phone]

## 2023-06-28 NOTE — Discharge Instructions (Addendum)
You were seen in the ER for evaluation of your symptoms.  Your workup does show you have mildly low potassium.  Please make sure you follow-up with your primary care doctor about this in the next few days.  I think you likely have some bronchitis or asthma.  I prescribed you some prednisone to help with this.  Please take as prescribed.  I have also refilled your albuterol inhaler as well.  Please take as directed.  Again, is important you follow with your primary care doctor.  If you have any other concerns, new or worsening symptoms, please return to the nearest for department for evaluation.  Contact a doctor if: Your symptoms do not get better in 2 weeks. You have trouble coughing up the mucus. Your cough keeps you awake at night. You have a fever. Get help right away if: You cough up blood. You have chest pain. You have very bad shortness of breath. You faint or keep feeling like you are going to faint. You have a very bad headache. Your fever or chills get worse. These symptoms may be an emergency. Get help right away. Call your local emergency services (911 in the [place] Do not wait to see if the symptoms will go away. Do not drive yourself to the hospital.

## 2023-08-07 DIAGNOSIS — J029 Acute pharyngitis, unspecified: Secondary | ICD-10-CM | POA: Diagnosis not present

## 2023-08-07 DIAGNOSIS — J069 Acute upper respiratory infection, unspecified: Secondary | ICD-10-CM | POA: Diagnosis not present

## 2023-08-07 DIAGNOSIS — R0981 Nasal congestion: Secondary | ICD-10-CM | POA: Diagnosis not present

## 2023-08-07 DIAGNOSIS — R051 Acute cough: Secondary | ICD-10-CM | POA: Diagnosis not present

## 2023-08-07 DIAGNOSIS — R52 Pain, unspecified: Secondary | ICD-10-CM | POA: Diagnosis not present

## 2023-08-07 DIAGNOSIS — Z20822 Contact with and (suspected) exposure to covid-19: Secondary | ICD-10-CM | POA: Diagnosis not present

## 2023-08-31 ENCOUNTER — Emergency Department (HOSPITAL_COMMUNITY)
Admission: EM | Admit: 2023-08-31 | Discharge: 2023-08-31 | Disposition: A | Discharge status: Home / Self Care | Attending: Emergency Medicine | Admitting: Emergency Medicine

## 2023-08-31 ENCOUNTER — Ambulatory Visit (HOSPITAL_COMMUNITY): Admission: EM | Admit: 2023-08-31 | Discharge: 2023-08-31 | Discharge status: Against Medical Advice

## 2023-08-31 DIAGNOSIS — F32A Depression, unspecified: Secondary | ICD-10-CM | POA: Insufficient documentation

## 2023-08-31 DIAGNOSIS — F419 Anxiety disorder, unspecified: Secondary | ICD-10-CM | POA: Insufficient documentation

## 2023-08-31 LAB — COMPREHENSIVE METABOLIC PANEL
ALT: 14 U/L (ref 0–44)
AST: 15 U/L (ref 15–41)
Albumin: 4 g/dL (ref 3.5–5.0)
Alkaline Phosphatase: 65 U/L (ref 38–126)
Anion gap: 10 (ref 5–15)
BUN: 9 mg/dL (ref 6–20)
CO2: 23 mmol/L (ref 22–32)
Calcium: 8.9 mg/dL (ref 8.9–10.3)
Chloride: 104 mmol/L (ref 98–111)
Creatinine, Ser: 0.81 mg/dL (ref 0.44–1.00)
GFR, Estimated: >60 mL/min (ref >60)
Glucose, Bld: 98 mg/dL (ref 70–99)
Potassium: 3.6 mmol/L (ref 3.5–5.1)
Sodium: 137 mmol/L (ref 135–145)
Total Bilirubin: 0.6 mg/dL (ref 0.0–1.2)
Total Protein: 7.7 g/dL (ref 6.5–8.1)

## 2023-08-31 LAB — CBC
HCT: 43.4 % (ref 36.0–46.0)
Hemoglobin: 14.2 g/dL (ref 12.0–15.0)
MCH: 31.6 pg (ref 26.0–34.0)
MCHC: 32.7 g/dL (ref 30.0–36.0)
MCV: 96.4 fL (ref 80.0–100.0)
Platelets: 437 10*3/uL — ABNORMAL HIGH (ref 150–400)
RBC: 4.5 MIL/uL (ref 3.87–5.11)
RDW: 13 % (ref 11.5–15.5)
WBC: 7.7 10*3/uL (ref 4.0–10.5)
nRBC: 0 % (ref 0.0–0.2)

## 2023-08-31 LAB — SALICYLATE LEVEL: Salicylate Lvl: <7 mg/dL — ABNORMAL LOW (ref 7.0–30.0)

## 2023-08-31 LAB — ACETAMINOPHEN LEVEL: Acetaminophen (Tylenol), Serum: <10 ug/mL — ABNORMAL LOW (ref 10–30)

## 2023-08-31 LAB — ETHANOL: Alcohol, Ethyl (B): <10 mg/dL
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
## 2023-08-31 NOTE — ED Notes (Signed)
 Pt requested to leave. [place] PA come out and talked with pt then agreed to discharge.

## 2023-08-31 NOTE — ED Triage Notes (Signed)
 Pt very tearful during triage. States that "my life has been spiraling since December". She then talks about several life stressors including relationship, job insecurity, housing insecurity, and issues with her children. Pt states that she considered suicide several days ago, but states that she does not feel that way now. Denies plan. No HI or AVH.

## 2023-08-31 NOTE — Discharge Instructions (Addendum)
 You were seen today for anxiety and depression.  Due to you not expressing any desire to hurt yourself or others or have any other concerning symptoms at this time, recommend that you follow-up with the behavioral health urgent care.  They are a great resource which you can access at any time 24/7.  I have attached their information here which you can go to anytime.    If you begin to have any new or worsening symptoms including thoughts of harming yourself or others or having any hallucinations or bouts of confusion, return to the ED for immediate evaluation.

## 2023-08-31 NOTE — ED Provider Notes (Signed)
 [facility] AT [facility] Provider Note   CSN: [id number] Arrival date & time: 08/31/23  1106     History  Chief Complaint  Patient presents with   Psychiatric Evaluation    [name] is a 38 y.o. female.  HPI Patient is a 38 year old female presents the ED today complaining of anxiety and depression has been getting worse since 4 months ago.  States she has had previous thoughts of harming herself without any plan but does not have any suicidal ideations today.  Initially came in wanting to be evaluated for psychiatric issues and speak to TTS however upon being in the ED he wishes to go home at this time.  Denies fever, chest pain, shortness of breath, HI, SI, hallucinations, palpitations.    Home Medications Prior to Admission medications   Medication Sig Start Date End Date Taking? Authorizing Provider  albuterol (VENTOLIN HFA) 108 (90 Base) MCG/ACT inhaler Inhale 1-2 puffs into the lungs every 4 (four) hours as needed for wheezing or shortness of breath. 06/28/23   Achille Rich, PA-C  cetirizine (ZYRTEC ALLERGY) 10 MG tablet Take 1 tablet (10 mg total) by mouth daily. 04/29/22   Rising, Lurena Joiner, PA-C  Fluticasone Propionate (FLONASE NA) Place 1 tablet into the nose as needed.    [provider]  omeprazole (PRILOSEC) 40 MG capsule Take 1 capsule (40 mg total) by mouth 2 (two) times daily for 14 days. Patient not taking: Reported on 05/29/2023 04/16/21 04/30/21  Iva Boop, MD  ondansetron (ZOFRAN) 4 MG tablet Take 1 tablet (4 mg total) by mouth every 8 (eight) hours as needed for nausea or vomiting. 04/10/23   Lockie Mola, MD      Allergies    Penicillins, Ciprofloxacin, Clindamycin/lincomycin, and Rocephin [ceftriaxone]    Review of Systems   Review of Systems  Psychiatric/Behavioral:  The patient is nervous/anxious.   All other systems reviewed and are negative.   Physical Exam Updated Vital Signs BP 127/82 (BP  Location: Left Arm)   Pulse 97   Temp 98.1 F (36.7 C) (Oral)   Resp 16   SpO2 100%  Physical Exam Vitals and nursing note reviewed.  Constitutional:      General: She is not in acute distress.    Appearance: Normal appearance. She is not ill-appearing.  HENT:     Head: Normocephalic and atraumatic.  Eyes:     General: No scleral icterus.       Right eye: No discharge.        Left eye: No discharge.     Extraocular Movements: Extraocular movements intact.     Conjunctiva/sclera: Conjunctivae normal.  Cardiovascular:     Rate and Rhythm: Normal rate and regular rhythm.     Pulses: Normal pulses.     Heart sounds: Normal heart sounds. No murmur heard.    No friction rub. No gallop.  Pulmonary:     Effort: Pulmonary effort is normal. No respiratory distress.     Breath sounds: Normal breath sounds.  Abdominal:     General: Abdomen is flat. There is no distension.     Palpations: Abdomen is soft.     Tenderness: There is no abdominal tenderness. There is no right CVA tenderness, left CVA tenderness or guarding.  Musculoskeletal:     Cervical back: No rigidity.  Skin:    General: Skin is warm and dry.     Coloration: Skin is not pale.     Findings: No bruising  or erythema.  Neurological:     General: No focal deficit present.     Mental Status: She is alert. Mental status is at baseline.     Sensory: No sensory deficit.     Motor: No weakness.  Psychiatric:        Behavior: Behavior normal.        Thought Content: Thought content normal.        Judgment: Judgment normal.     Comments: Patient noted to be anxious appearing but behavior is normal, thought content and judgment is also normal.     ED Results / Procedures / Treatments   Labs (all labs ordered are listed, but only abnormal results are displayed) Labs Reviewed  CBC - Abnormal; Notable for the following components:      Result Value   Platelets 437 (*)    All other components within normal limits   COMPREHENSIVE METABOLIC PANEL  ETHANOL  SALICYLATE LEVEL  ACETAMINOPHEN LEVEL  RAPID URINE DRUG SCREEN, HOSP PERFORMED  HCG, SERUM, QUALITATIVE    EKG None  Radiology No results found.  Procedures Procedures    Medications Ordered in ED Medications - No data to display  ED Course/ Medical Decision Making/ A&P                                 Medical Decision Making Amount and/or Complexity of Data Reviewed Labs: ordered.   Patient is a 38 year old female presents the ED today complaining of anxiety and depression has been getting worse since 4 months ago.  States she has had previous thoughts of harming herself without any plan but does not have any suicidal ideations today.  Initially came in wanting to be evaluated for psychiatric issues and speak to TTS however upon being in the ED he wishes to go home at this time.  On evaluation, patient is in no acute distress, afebrile, alert noted x 4, speaking in full sentences.  She appears mildly anxious but otherwise able to answer questions appropriately and is expressing a desire to leave.  Due to her not having any SI or HI or hallucinations or any other emergent symptoms at this time, and with her wishing to leave and being of sound mind, I believe this patient is safe to be discharged at this time.  She is wishing to follow-up in outpatient setting with her primary care or the behavioral health urgent care, due to the environment of the ED being "too stressful." I provided strict return to ED precautions.  She is accompanied with her mother who is also going to be watching her.  All questions were answered.  I believe patient safe discharge at this time.  Will have her follow-up with a [facility] with all affirmation provided.  Differential diagnoses prior to evaluation: SI, HI, depression, anxiety, bipolar, thyroid disorder, drug use disorder, metabolic disturbance  Past Medical History / Social History /  Additional history: Chart reviewed. Pertinent results include:   Seen for hypokalemia on 06/28/2023 noted to have intermittent asthma exacerbations for which she was worked up for PE and was undetectable.  Given albuterol inhaler and prednisone  Medications / Treatment: No medications or treatments are necessary this time.   Disposition: After consideration of the diagnostic results and the patients response to treatment, I feel that the patient benefit from discharge and treatment as above.   emergency department workup does not suggest an  emergent condition requiring admission or immediate intervention beyond what has been performed at this time. The plan is: Follow-up with behavior health urgent care, return to the ED for any new or worsening symptoms. The patient is safe for discharge and has been instructed to return immediately for worsening symptoms, change in symptoms or any other concerns.  Final Clinical Impression(s) / ED Diagnoses Final diagnoses:  Anxiety    Rx / DC Orders ED Discharge Orders     None         Lavonia Drafts 08/31/23 1216    Gwyneth Sprout, MD 09/01/23 1438

## 2023-09-03 ENCOUNTER — Ambulatory Visit

## 2023-09-03 VITALS — BP 106/61 | HR 88 | Ht 63.0 in | Wt 184.8 lb

## 2023-09-03 DIAGNOSIS — R4589 Other symptoms and signs involving emotional state: Secondary | ICD-10-CM

## 2023-09-03 DIAGNOSIS — F322 Major depressive disorder, single episode, severe without psychotic features: Secondary | ICD-10-CM

## 2023-09-03 NOTE — Progress Notes (Signed)
    SUBJECTIVE:   CHIEF COMPLAINT / HPI:   [name] is a 38 y.o. female  presenting to reestablish care.  She has not been seen in our clinic since 2020.  She presents today to discuss worsening depression.  She noticed an acute change in November 2024.  She has never been on medication in the past.  She has never had trouble with anxiety depression in the past.  There is a strong family history for mental health disorders including schizophrenia.  She does not recall any signs of mania in the past.  She reports she has not had thoughts of hurting herself or being better off dead.  PERTINENT  PMH / PSH: Reviewed and updated   OBJECTIVE:   BP 106/61   Pulse 88   Ht 5\' 3"  (1.6 m)   Wt 184 lb 12.8 oz (83.8 kg)   SpO2 99%   BMI 32.74 kg/m   Depressed-appearing, no acute distress Cardio: Regular rate, regular rhythm, no murmurs on exam. Pulm: Clear, no wheezing, no crackles. No increased work of breathing Abdominal: bowel sounds present, soft, non-tender, non-distended Extremities: no peripheral edema  Neuro: alert and oriented x3, speech normal in content, no facial asymmetry, strength intact and equal bilaterally in UE and LE, pupils equal and reactive to light.  Psych:  Cognition and judgment appear intact. Alert, communicative  and cooperative with normal attention span and concentration. No apparent delusions, illusions, hallucinations      09/03/2023    2:56 PM 09/27/2018    3:36 PM 06/30/2016    2:18 PM  PHQ9 SCORE ONLY  PHQ-9 Total Score 20 0 0      ASSESSMENT/PLAN:   Depressed mood Patient is severely depressed in the office today.  Fortunately she is not actively or passively suicidal.  With family history of schizophrenia and potentially bipolar we will be cautious when prescribing medication.  Patient given therapy resources and referral to psychiatry sent for medication selection.  Patient to follow-up in 2 weeks.     Glendale Chard, DO [facility]

## 2023-09-03 NOTE — Patient Instructions (Signed)
 Future Appointments  Date Time Provider Department Center  09/15/2023  3:30 PM Glendale Chard, DO [facility]    Please arrive 15 minutes before your appointment to ensure smooth check in process.    Please call the clinic at [phone] if your symptoms worsen or you have any concerns.  Thank you for allowing me to participate in your care, Dr. Glendale Chard [facility]    Therapy and Counseling Resources Most providers on this list will take Medicaid. Patients with commercial insurance or Medicare should contact their insurance company to get a list of in network providers.  [facility] (takes children) Location 1: [address] [place], [place] [address] Location 2: [address] [place], [place] [address] [phone]   [facility] (spanish speaking therapist available)(habla espanol)(take medicare and medicaid)  [address], [place], [place] [address], [email or web address] [phone]  [facility]:Psychiatry and Counseling [address] [place] [place], [place] [address] [phone]  [facility]   [address] [place], [place] [address]      [phone]  [facility] (spanish available) [address]  [place], [place] [address] [phone]  [facility] (take [facility] and medicare) [address]  [place], [place] [address]       [phone]     [facility] (virtual only) [phone]  Jovita Kussmaul [facility] [address], [place], [place] [phone]  [facility]:  [address] [place] [phone]  [facility]:  [address] [place] [phone]  [facility] (under & uninsured) [address]   [place] [phone]    [email or web address]    [facility] [address]  [place]    [phone]  Mental [facility] -[address]     Phone:  [phone]     [facility]-  [address]  [phone]   [facility] [address]      [place], [place] [phone]  [facility]: [address] [place], [place], [place]  [phone]   [facility] (Spanish therapist) [email or web address]  [address] [place]  [address]   [place] [address]    [phone]    [facility]   [address],  [place], [place]  [phone]   [facility]  [address] [place]  [phone]  [facility]  [address] [place], [place]        [phone]  Open Access/Walk In Clinic under & uninsured  [facility]  [address] [place], [place] [phone] Crisis [phone]  [facility],  (Spanish)   [address], [place]: [phone]) 8:30 - 12; 1 - 2:30  [facility] [employer],  [address] [place], [place]    [phone]):8:30 - 12; 2 - 3PM  [facility] [employer],  [address],  [place]; [phone]):   Mon - Fri 8 AM - 5 PM  [facility] [address] [place]  MWF 12:30 to 3:00 or call to schedule an appointment  [phone]  Specific Provider options Psychology Today  [email or web address] click on find a therapist  enter your zip code left side and select or tailor a therapist for your specific need.  [facility] Provider Directory [email or web address]  (Medicaid)   Follow all drop down to find a provider  Social Support program [facility] [place] [phone] or Photo[employer].pl [address], [place], [place] Recovery support and educational   24- Hour Availability:   [facility]  [address] [place], [place] [phone] Crisis  [phone]  [facility] [phone]  [place] [facility]  [phone]   [facility]  [phone] (after hours)  Therapeutic Alternative/Mobile Crisis   [phone]  [place] National Suicide Hotline  [phone] Len Childs)  Call 911 or go to emergency room  [facility]  [phone]);  [place] and [employer]  [phone]); [place], [place], [place], [place], [place], [place], [place]

## 2023-09-04 DIAGNOSIS — R4589 Other symptoms and signs involving emotional state: Secondary | ICD-10-CM | POA: Insufficient documentation

## 2023-09-04 NOTE — Assessment & Plan Note (Signed)
 Patient is severely depressed in the office today.  Fortunately she is not actively or passively suicidal.  With family history of schizophrenia and potentially bipolar we will be cautious when prescribing medication.  Patient given therapy resources and referral to psychiatry sent for medication selection.  Patient to follow-up in 2 weeks.

## 2023-09-15 ENCOUNTER — Ambulatory Visit: Payer: Self-pay | Admitting: Student

## 2023-10-22 ENCOUNTER — Telehealth: Payer: Self-pay

## 2023-10-22 ENCOUNTER — Ambulatory Visit (INDEPENDENT_AMBULATORY_CARE_PROVIDER_SITE_OTHER): Admitting: Family Medicine

## 2023-10-22 VITALS — BP 110/68 | HR 87 | Temp 98.4°F | Ht 63.0 in | Wt 184.1 lb

## 2023-10-22 DIAGNOSIS — L219 Seborrheic dermatitis, unspecified: Secondary | ICD-10-CM | POA: Diagnosis present

## 2023-10-22 MED ORDER — KETOCONAZOLE 2 % EX SHAM: 1.0000 | MEDICATED_SHAMPOO | CUTANEOUS | 0 refills | Status: AC

## 2023-10-22 MED ORDER — FLUOCINONIDE 0.05 % EX SOLN: 1.0000 | Freq: Two times a day (BID) | CUTANEOUS | 0 refills | Status: DC

## 2023-10-22 NOTE — Telephone Encounter (Signed)
 [facility] calls nurse line in regards Lidex.  Pharmacist reports conflicting direction. He requests clarification on BID vs once per day.   He asks a new prescription be sent in reflecting change.   Will forward to PCP.

## 2023-10-22 NOTE — Progress Notes (Signed)
    SUBJECTIVE:   CHIEF COMPLAINT / HPI:   Lump behind ear and on scalp, lesions around back of head Lumps have been there for about a week. The one on the back of the scalp is painful. Hard to sleep due to this. Also has the lesions at the back of her head and along hairline and in the hair that feels like a chemical burn. She has not used any new hair products. First time it has ever happened. Does have eczema but would be first time it shows here.  PERTINENT  PMH / PSH: atopic disease, ADHD  OBJECTIVE:   BP 110/68   Pulse 87   Temp 98.4 F (36.9 C) (Oral)   Ht 5\' 3"  (1.6 m)   Wt 184 lb 2 oz (83.5 kg)   SpO2 100%   BMI 32.62 kg/m   General: Alert and oriented, in NAD Skin: Warm, dry, diffuse scaling and shedding of the scalp with intermittent areas of erythema, mild pain with manipulation of hair shafts though no areas of significant hair loss/edema HEENT: NCAT, EOM grossly normal, midline nasal septum, right 1 to 1-1/2 cm hard and fixed mass just posterior to the ear with mild tenderness Respiratory: Breathing and speaking comfortably on RA Extremities: Moves all extremities grossly equally Neurological: No gross focal deficit Psychiatric: Appropriate mood and affect   Frontal scalp and hairline   Right posterior auricular firm mass   Top of scalp  ASSESSMENT/PLAN:   Assessment & Plan Seborrheic dermatitis of scalp Given extensive nature with tenderness of the hair shaft and evidence of presumed lymphadenopathy secondary to inflammation given positioning and time course, will treat with ketoconazole shampoo twice a week for 2-4 weeks and fluocinonide solution daily for 2 weeks.  Advised to let me know how she is doing after 2 weeks.  If symptoms have not improved or if lymph node behind the ear has not resolved, advised to return for further evaluation.   Kristina Kenning, MD [facility]

## 2023-10-22 NOTE — Patient Instructions (Signed)
 I sent in fluocinonide (a steroid) and ketoconazole (an antifungal) to your pharmacy. Apply the steroid daily. Apply the ketoconazole twice a week for 2-4 weeks. Leave in for 5 minutes each time. Let me know if after 2 weeks you are not seeing much improvement in symptoms or the node behind your ear!

## 2023-10-23 ENCOUNTER — Ambulatory Visit (HOSPITAL_COMMUNITY): Admitting: Physician Assistant

## 2023-10-23 ENCOUNTER — Encounter (HOSPITAL_COMMUNITY): Payer: Self-pay

## 2023-10-23 ENCOUNTER — Telehealth: Payer: Self-pay

## 2023-10-23 MED ORDER — FLUOCINONIDE 0.05 % EX SOLN: 1.0000 | Freq: Every day | CUTANEOUS | 0 refills | Status: DC

## 2023-10-23 NOTE — Telephone Encounter (Signed)
 Patient calls nurse line in regards to Lidex solution.   She reports she just applied an application right before making this call and reports an immediate "burning" sensation.   She reports she feels her head is "on fire." She denies any ulcerations or burns. No fevers or chills.   She reports the packaging states to use twice daily, however she is unsure if she is having a reaction to the product or if this is normal. She requests to speak with Dr. Fredrik Jensen prior to applying another application.   Advised to call the pharmacy for their input and advised I will reach out to Dr. Fredrik Jensen as well.   Advised if unbearable to wash the product out.   ED precautions discussed with patient.

## 2023-10-24 MED ORDER — CLOBETASOL PROPIONATE 0.05 % EX SHAM: 1.0000 | MEDICATED_SHAMPOO | Freq: Every day | CUTANEOUS | 0 refills | Status: AC

## 2023-10-24 MED ORDER — CLOBETASOL PROPIONATE 0.05 % EX SHAM: 1.0000 | MEDICATED_SHAMPOO | Freq: Every day | CUTANEOUS | 0 refills | Status: DC

## 2023-10-24 NOTE — Telephone Encounter (Signed)
 Spoke with patient regarding adverse reaction to fluocinonide .  Patient's scalp began to burn after application.  [facility] CMA advised to call pharmacist who also advised to wash the product out.  She has not applied any more.  She has applied the ketoconazole , however.  Discussed with patient we will try another medication.  Unfortunately betamethasone foam is not covered by patient's insurance; therefore, will send in clobetasol shampoo for use daily (instead of twice daily given superhigh potency).  Advised to let us  know if any adverse reactions.  Advised to use for a total of 2 weeks maximum.

## 2023-10-24 NOTE — Addendum Note (Signed)
 Addended by: Dema Filler B on: 10/24/2023 08:16 PM   Modules accepted: Orders

## 2023-10-26 NOTE — Telephone Encounter (Signed)
 Patient calls nurse line in regards to scalp medication.   She reports she spoke with Mabe over the weekend and he was going to send in a foam.   She reports she went to the pharmacy today and reports a shampoo was called in instead. Advised, it appears the foam is not covered by patients insurance.   She stated "shampooing" her hair multiple times is not ideal for her being an "african [place] female."   She reports if there are any other alternatives she would be grateful.  She reports she can "speak for herself" and prefers to speak directly with Dr. Fredrik Jensen.  I looked up a coupon for betamethasone foam and ~ 50 dollars at [employer].   Will forward to Mabe.

## 2023-10-30 ENCOUNTER — Other Ambulatory Visit: Payer: Self-pay

## 2023-10-30 NOTE — Telephone Encounter (Signed)
 Pharmacy faxed that patient stated she wants the foam medication not the shampoo.  Christ Courier, CMA

## 2023-11-20 ENCOUNTER — Encounter: Payer: Self-pay | Admitting: Emergency Medicine

## 2023-11-20 ENCOUNTER — Ambulatory Visit: Admission: EM | Admit: 2023-11-20 | Discharge: 2023-11-20 | Disposition: A | Discharge status: Home / Self Care

## 2023-11-20 DIAGNOSIS — W57XXXA Bitten or stung by nonvenomous insect and other nonvenomous arthropods, initial encounter: Secondary | ICD-10-CM

## 2023-11-20 DIAGNOSIS — L03115 Cellulitis of right lower limb: Secondary | ICD-10-CM | POA: Diagnosis not present

## 2023-11-20 DIAGNOSIS — S90561A Insect bite (nonvenomous), right ankle, initial encounter: Secondary | ICD-10-CM | POA: Diagnosis not present

## 2023-11-20 MED ORDER — DOXYCYCLINE HYCLATE 100 MG PO CAPS: 100.0000 mg | ORAL_CAPSULE | Freq: Two times a day (BID) | ORAL | 0 refills | Status: AC

## 2023-11-20 NOTE — ED Provider Notes (Signed)
 [facility]    CSN: [id number] Arrival date & time: 11/20/23  1358      History   Chief Complaint Chief Complaint  Patient presents with   Nausea   Insect Bite    HPI [name] is a 38 y.o. female.   Patient presents today with a 3-day history of wounds on her right lateral ankle.  Reports that she was outside weeding to enter the [facility] for her son's graduation when she felt something bite her.  She was not paying attention and did not see exactly what type of insect this was.  She had some pruritus and noticed that it swelled significantly within a few hours but was not particularly bothersome.  Over the past few days it has become painful when she walks and she has noticed increasing erythema.  She denies any fever, nausea, vomiting.  She does report a remote history of MRSA many years ago but has not been diagnosed with this recently and denies any recurrent skin infections.  She denies any recent antibiotics.  She is confident she is not pregnant.  She has had some nausea and diarrhea intermittently but previously thought this was related to the increased food in celebration of her son's graduation but is unsure if it could be related to illness.  She does report some associated pruritus.    Past Medical History:  Diagnosis Date   Adenomyosis 2012   Planning hysterectomy    ADHD (attention deficit hyperactivity disorder)    NO MEDS   AKI (acute kidney injury) (HCC) 06/02/2015   Allergy 1988   Hay fever since birth    Asthma 1988   BV (bacterial vaginosis) 10/07/2018   Helicobacter pylori gastritis 04/15/2021   Lumbar spondylolysis    Pyelonephritis 06/02/2015   Sepsis secondary to UTI (HCC) 06/05/2015   Thyromegaly    H/O    Patient Active Problem List   Diagnosis Date Noted   Depressed mood 09/04/2023   ADHD (attention deficit hyperactivity disorder) 09/03/2012   Chronic abdominal pain 05/27/2012   Decreased vision 03/19/2012   Tobacco abuse  03/19/2012   Refusal of blood transfusions as patient is Jehovah's Witness 11/11/2011   Adenomyosis    Eczema 09/27/2010   Endometriosis of uterus 09/27/2010   Allergic rhinitis 08/02/2010   Asthma 08/01/2010    Past Surgical History:  Procedure Laterality Date   ENDOMETRIAL ABLATION  2011   INCISE AND DRAIN ABCESS     surgical incision, wound produced mrsa   TUBAL LIGATION  2010    OB History     Gravida  3   Para  3   Term  3   Preterm      AB      Living  3      SAB      IAB      Ectopic      Multiple      Live Births  3            Home Medications    Prior to Admission medications   Medication Sig Start Date End Date Taking? Authorizing Provider  doxycycline  (VIBRAMYCIN ) 100 MG capsule Take 1 capsule (100 mg total) by mouth 2 (two) times daily. 11/20/23  Yes Lucianne Smestad K, PA-C  ketoconazole  (NIZORAL ) 2 % shampoo Apply 1 Application topically 2 (two) times a week. Apply for 2 weeks. May extend to 4 weeks if needed. 10/22/23  Yes Dema Filler, MD  fluticasone (FLONASE) 50 MCG/ACT nasal  spray Place 1 spray into the nose. 08/07/23 08/06/24  [provider]  NOREL AD 4-10-325 MG TABS Take 1 tablet by mouth 4 (four) times daily. Patient not taking: Reported on 11/20/2023 08/07/23   [provider]    Family History Family History  Problem Relation Age of Onset   Alzheimer's disease Maternal Grandfather    Depression Sister    Depression Mother    Diabetes Father    Ovarian cancer Other        maternal great grandmother   Uterine cancer Other        maternal great aunt   Testicular cancer Cousin        maternal cousin    Social History Social History   Tobacco Use   Smoking status: Some Days    Current packs/day: 0.25    Types: Cigarettes   Smokeless tobacco: Never  Vaping Use   Vaping status: Never Used  Substance Use Topics   Alcohol use: Yes    Alcohol/week: 1.0 standard drink of alcohol    Types: 1 Cans of beer per  week    Comment: occasional   Drug use: No     Allergies   Penicillins, Ciprofloxacin , Clindamycin , Clindamycin /lincomycin, Fluocinonide , and Rocephin  [ceftriaxone ]   Review of Systems Review of Systems  Constitutional:  Positive for activity change. Negative for appetite change, fatigue and fever.  Gastrointestinal:  Positive for diarrhea and nausea. Negative for abdominal pain and vomiting.  Musculoskeletal:  Positive for arthralgias. Negative for myalgias.  Skin:  Positive for wound.  Neurological:  Negative for weakness and numbness.     Physical Exam Triage Vital Signs ED Triage Vitals  Encounter Vitals Group     BP 11/20/23 1459 105/71     Girls Systolic BP Percentile --      Girls Diastolic BP Percentile --      Boys Systolic BP Percentile --      Boys Diastolic BP Percentile --      Pulse Rate 11/20/23 1459 90     Resp 11/20/23 1459 14     Temp 11/20/23 1459 98.4 F (36.9 C)     Temp Source 11/20/23 1459 Oral     SpO2 11/20/23 1459 96 %     Weight --      Height --      Head Circumference --      Peak Flow --      Pain Score 11/20/23 1500 5     Pain Loc --      Pain Education --      Exclude from Growth Chart --    No data found.  Updated Vital Signs BP 105/71 (BP Location: Left Arm)   Pulse 90   Temp 98.4 F (36.9 C) (Oral)   Resp 14   SpO2 96%   Visual Acuity Right Eye Distance:   Left Eye Distance:   Bilateral Distance:    Right Eye Near:   Left Eye Near:    Bilateral Near:     Physical Exam Vitals reviewed.  Constitutional:      General: She is awake. She is not in acute distress.    Appearance: Normal appearance. She is well-developed. She is not ill-appearing.     Comments: Very pleasant female appears stated age in no acute distress sitting comfortably in exam room  HENT:     Head: Normocephalic and atraumatic.   Cardiovascular:     Rate and Rhythm: Normal rate and regular rhythm.  Heart sounds: Normal heart sounds, S1 normal  and S2 normal. No murmur heard.    Comments: Capillary refill within 2 seconds right toes. Pulmonary:     Effort: Pulmonary effort is normal.     Breath sounds: Normal breath sounds. No wheezing, rhonchi or rales.     Comments: Clear to auscultation bilaterally  Skin:        Comments: 0.5 cm nodule with honey crust.  Area of erythema surrounding lesion measures approximately 5 cm x 2 cm.  No streaking or evidence of lymphangitis.  Area is warm to touch and tender to palpation.  No active bleeding on exam.   Psychiatric:        Behavior: Behavior is cooperative.      UC Treatments / Results  Labs (all labs ordered are listed, but only abnormal results are displayed) Labs Reviewed - No data to display  EKG   Radiology No results found.  Procedures Procedures (including critical care time)  Medications Ordered in UC Medications - No data to display  Initial Impression / Assessment and Plan / UC Course  I have reviewed the triage vital signs and the nursing notes.  Pertinent labs & imaging results that were available during my care of the patient were reviewed by me and considered in my medical decision making (see chart for details).     Patient is well-appearing, afebrile, nontoxic, nontachycardic.  Concern for cellulitis triggered by insect bite given her clinical presentation.  Will start doxycycline  as this will cover for MRSA given her history as well as cellulitis.  We discussed that she should avoid prolonged sun exposure while on this medication.  She is to keep the area clean with soap and water.  She does report some associated pruritus and so I recommended that she also take an antihistamine and apply hydrocortisone for some of the areas that are bothersome in case there is an allergic component.  We discussed that if this is not improving within a few days or if anything worsens and she has rapid spread of redness, increasing pain, fever, nausea, vomiting she needs  to be seen immediately.  Strict return precautions given.  Final Clinical Impressions(s) / UC Diagnoses   Final diagnoses:  Cellulitis of right ankle  Insect bite of right ankle, initial encounter     Discharge Instructions      We are treating you for an infection.  Start doxycycline  100 mg twice daily for 10 days.  If you are taking any birth control antibiotics can decrease the effectiveness so use backup birth control such as condoms until your next menstrual cycle.  Keep the area clean with soap and water.  Take cetirizine  daily to help with itching and you can apply hydrocortisone cream when this is bothersome.  Follow-up with your primary care if this is not improving within a week.  If anything worsens you have rapid spread of redness, increasing pain, swelling, fever, nausea, vomiting you need to be seen immediately.     ED Prescriptions     Medication Sig Dispense Auth. Provider   doxycycline  (VIBRAMYCIN ) 100 MG capsule Take 1 capsule (100 mg total) by mouth 2 (two) times daily. 20 capsule Trasean Delima K, PA-C      PDMP not reviewed this encounter.   Budd Cargo, PA-C 11/20/23 1553

## 2023-11-20 NOTE — ED Triage Notes (Addendum)
 Pt here for insect bite on upper R foot x2 days. Pt reports swelling, itching, and moderate pain to the area. Swelling has went down with the use of ice packs at home. Pain is creating a limping gait for pt. Unsure what type of insect bit her. Pt notes intermittent periods of nausea started ~1hr after the insect bite and have continued since. She is unsure if they are related. Denies emesis or diarrhea. Not currently nauseous.

## 2023-11-20 NOTE — Discharge Instructions (Signed)
 We are treating you for an infection.  Start doxycycline  100 mg twice daily for 10 days.  If you are taking any birth control antibiotics can decrease the effectiveness so use backup birth control such as condoms until your next menstrual cycle.  Keep the area clean with soap and water.  Take cetirizine  daily to help with itching and you can apply hydrocortisone cream when this is bothersome.  Follow-up with your primary care if this is not improving within a week.  If anything worsens you have rapid spread of redness, increasing pain, swelling, fever, nausea, vomiting you need to be seen immediately.

## 2023-12-15 ENCOUNTER — Ambulatory Visit (HOSPITAL_COMMUNITY): Admitting: Physician Assistant
